# Patient Record
Sex: Male | Born: 1945 | Race: White | Hispanic: No | Marital: Married | Smoking: Former smoker
Health system: Southern US, Community
[De-identification: ages and names within clinical notes are randomized; demographics above are authoritative.]

## PROBLEM LIST (undated history)

## (undated) DIAGNOSIS — I878 Other specified disorders of veins: Secondary | ICD-10-CM

## (undated) DIAGNOSIS — I252 Old myocardial infarction: Secondary | ICD-10-CM

## (undated) DIAGNOSIS — I255 Ischemic cardiomyopathy: Secondary | ICD-10-CM

## (undated) DIAGNOSIS — E785 Hyperlipidemia, unspecified: Secondary | ICD-10-CM

## (undated) DIAGNOSIS — C4491 Basal cell carcinoma of skin, unspecified: Secondary | ICD-10-CM

## (undated) DIAGNOSIS — I2510 Atherosclerotic heart disease of native coronary artery without angina pectoris: Secondary | ICD-10-CM

## (undated) DIAGNOSIS — I739 Peripheral vascular disease, unspecified: Secondary | ICD-10-CM

## (undated) DIAGNOSIS — I471 Supraventricular tachycardia, unspecified: Secondary | ICD-10-CM

## (undated) DIAGNOSIS — G629 Polyneuropathy, unspecified: Secondary | ICD-10-CM

## (undated) DIAGNOSIS — I214 Non-ST elevation (NSTEMI) myocardial infarction: Secondary | ICD-10-CM

## (undated) DIAGNOSIS — G4730 Sleep apnea, unspecified: Secondary | ICD-10-CM

## (undated) DIAGNOSIS — I6529 Occlusion and stenosis of unspecified carotid artery: Secondary | ICD-10-CM

## (undated) DIAGNOSIS — A419 Sepsis, unspecified organism: Secondary | ICD-10-CM

## (undated) DIAGNOSIS — M869 Osteomyelitis, unspecified: Secondary | ICD-10-CM

## (undated) DIAGNOSIS — Z8673 Personal history of transient ischemic attack (TIA), and cerebral infarction without residual deficits: Secondary | ICD-10-CM

## (undated) DIAGNOSIS — I10 Essential (primary) hypertension: Secondary | ICD-10-CM

## (undated) HISTORY — DX: Supraventricular tachycardia, unspecified: I47.10

## (undated) HISTORY — DX: Basal cell carcinoma of skin, unspecified: C44.91

## (undated) HISTORY — DX: Ischemic cardiomyopathy: I25.5

## (undated) HISTORY — DX: Hyperlipidemia, unspecified: E78.5

## (undated) HISTORY — DX: Occlusion and stenosis of unspecified carotid artery: I65.29

## (undated) HISTORY — DX: Atherosclerotic heart disease of native coronary artery without angina pectoris: I25.10

## (undated) HISTORY — DX: Non-ST elevation (NSTEMI) myocardial infarction: I21.4

## (undated) HISTORY — DX: Supraventricular tachycardia: I47.1

## (undated) HISTORY — DX: Personal history of transient ischemic attack (TIA), and cerebral infarction without residual deficits: Z86.73

## (undated) HISTORY — DX: Polyneuropathy, unspecified: G62.9

## (undated) HISTORY — DX: Old myocardial infarction: I25.2

## (undated) HISTORY — PX: PILONIDAL CYST EXCISION: SHX744

## (undated) HISTORY — DX: Sleep apnea, unspecified: G47.30

## (undated) HISTORY — DX: Osteomyelitis, unspecified: M86.9

## (undated) HISTORY — DX: Peripheral vascular disease, unspecified: I73.9

## (undated) HISTORY — DX: Other specified disorders of veins: I87.8

## (undated) HISTORY — DX: Essential (primary) hypertension: I10

## (undated) HISTORY — DX: Morbid (severe) obesity due to excess calories: E66.01

---

## 1985-04-19 DIAGNOSIS — I252 Old myocardial infarction: Secondary | ICD-10-CM

## 1985-04-19 HISTORY — DX: Old myocardial infarction: I25.2

## 1991-04-20 HISTORY — PX: CORONARY ARTERY BYPASS GRAFT: SHX141

## 1998-04-19 HISTORY — PX: OTHER SURGICAL HISTORY: SHX169

## 2000-01-16 ENCOUNTER — Encounter: Payer: Self-pay | Admitting: Emergency Medicine

## 2000-01-16 ENCOUNTER — Emergency Department (HOSPITAL_COMMUNITY): Admission: EM | Admit: 2000-01-16 | Discharge: 2000-01-16 | Payer: Self-pay | Admitting: Emergency Medicine

## 2002-06-05 ENCOUNTER — Encounter: Payer: Self-pay | Admitting: Emergency Medicine

## 2002-06-05 ENCOUNTER — Emergency Department (HOSPITAL_COMMUNITY): Admission: EM | Admit: 2002-06-05 | Discharge: 2002-06-05 | Payer: Self-pay | Admitting: Emergency Medicine

## 2002-06-12 ENCOUNTER — Emergency Department (HOSPITAL_COMMUNITY): Admission: EM | Admit: 2002-06-12 | Discharge: 2002-06-12 | Payer: Self-pay | Admitting: Emergency Medicine

## 2007-10-31 ENCOUNTER — Encounter: Payer: Self-pay | Admitting: Internal Medicine

## 2007-10-31 ENCOUNTER — Ambulatory Visit (HOSPITAL_BASED_OUTPATIENT_CLINIC_OR_DEPARTMENT_OTHER): Admission: RE | Admit: 2007-10-31 | Discharge: 2007-10-31 | Payer: Self-pay | Admitting: Cardiology

## 2007-11-04 ENCOUNTER — Ambulatory Visit: Payer: Self-pay | Admitting: Internal Medicine

## 2007-12-11 ENCOUNTER — Ambulatory Visit: Payer: Self-pay | Admitting: Internal Medicine

## 2007-12-11 DIAGNOSIS — Z8679 Personal history of other diseases of the circulatory system: Secondary | ICD-10-CM | POA: Insufficient documentation

## 2007-12-11 DIAGNOSIS — E785 Hyperlipidemia, unspecified: Secondary | ICD-10-CM | POA: Insufficient documentation

## 2007-12-11 DIAGNOSIS — I10 Essential (primary) hypertension: Secondary | ICD-10-CM | POA: Insufficient documentation

## 2007-12-11 DIAGNOSIS — G4733 Obstructive sleep apnea (adult) (pediatric): Secondary | ICD-10-CM

## 2007-12-17 DIAGNOSIS — I2510 Atherosclerotic heart disease of native coronary artery without angina pectoris: Secondary | ICD-10-CM | POA: Insufficient documentation

## 2007-12-19 ENCOUNTER — Encounter: Payer: Self-pay | Admitting: Internal Medicine

## 2007-12-19 ENCOUNTER — Telehealth (INDEPENDENT_AMBULATORY_CARE_PROVIDER_SITE_OTHER): Payer: Self-pay | Admitting: *Deleted

## 2008-01-11 ENCOUNTER — Ambulatory Visit: Payer: Self-pay | Admitting: Internal Medicine

## 2008-05-10 ENCOUNTER — Ambulatory Visit: Payer: Self-pay | Admitting: Internal Medicine

## 2008-09-26 ENCOUNTER — Encounter (HOSPITAL_BASED_OUTPATIENT_CLINIC_OR_DEPARTMENT_OTHER): Admission: RE | Admit: 2008-09-26 | Discharge: 2008-10-16 | Payer: Self-pay | Admitting: Internal Medicine

## 2008-10-16 ENCOUNTER — Encounter (HOSPITAL_BASED_OUTPATIENT_CLINIC_OR_DEPARTMENT_OTHER): Admission: RE | Admit: 2008-10-16 | Discharge: 2008-11-19 | Payer: Self-pay | Admitting: Internal Medicine

## 2009-05-09 ENCOUNTER — Ambulatory Visit: Payer: Self-pay | Admitting: Internal Medicine

## 2010-01-07 ENCOUNTER — Ambulatory Visit: Payer: Self-pay | Admitting: Cardiology

## 2010-02-07 ENCOUNTER — Encounter: Payer: Self-pay | Admitting: Internal Medicine

## 2010-03-10 ENCOUNTER — Ambulatory Visit: Payer: Self-pay | Admitting: Cardiology

## 2010-04-03 ENCOUNTER — Encounter: Payer: Self-pay | Admitting: Internal Medicine

## 2010-04-29 ENCOUNTER — Ambulatory Visit: Payer: Self-pay | Admitting: Cardiology

## 2010-05-08 ENCOUNTER — Ambulatory Visit
Admission: RE | Admit: 2010-05-08 | Discharge: 2010-05-08 | Payer: Self-pay | Source: Home / Self Care | Attending: Internal Medicine | Admitting: Internal Medicine

## 2010-05-13 ENCOUNTER — Encounter: Payer: Self-pay | Admitting: Internal Medicine

## 2010-05-19 NOTE — Letter (Signed)
Summary: CMN for CPAP Supplies/[facility]  CMN for CPAP Supplies/[facility]   Imported By: Sherian Rein 02/16/2010 13:47:05  _____________________________________________________________________  External Attachment:    Type:   Image     Comment:   External Document

## 2010-05-19 NOTE — Assessment & Plan Note (Signed)
Summary: 12 months/apc   Copy to:  Dr.Tennant Primary Provider/Referring Provider:  [name]  CC:  yearly follow up visit.  History of Present Illness: 01/11/08- From 12/11/07      65 year old man seen in sleep medicine consultation at the kind request of Dr. Delfin Waters. he complains of difficulty falling asleep and and excessive waking after sleep onset.  Bedtime between 10 and 11 p.m..  Estimates sleep latency at one hour.  Wakes two to 4 times during the night before finally up at 6 a.m.  No sleep medications.  His wife has told him that he snores loudly and stops breathing.  This has gone on for years.  He denies daytime sleepiness.  He denies active limb movement during sleep.  Usually drinks 6 to 8 cups of coffee a day, in the morning, because he likes it. No hx of ENT surgery. Questions remote nasal fx, never rx'd. No hx CNS or thyroid.  NPSG 10/31/07 AHI 98.4, desat to 70%. CPAP titrated to 19 cwp for AHI 0/hr.  01/11/08- Wearing CPAP from [facility] all night, every night. Strap irritates back of neck, but cpap pressure seems ok. Better rested in AM but still tired by afternoon.  05/10/08- OSA  Persistent small tender bump on neck under cpap strap. Pressure seems good.  Happy with cpap still at 19. May not wake during night. No longer wakes smothered and denies daytime sleepiness.  May 09, 2009- OSA  He denies problems and confirms that life is much better with CPAP than without. He uses it every night and is no longer tired in the day.  Not told that he snores through it. Much less nocturia. Pressure is still at 19. Denies nasal congestion or other interfering problems.   Current Medications (verified): 1)  Potassium Chloride 20 Meq Pack (Potassium Chloride) .... Take One Tablet Daily 2)  Lipitor 40 Mg Tabs (Atorvastatin Calcium) .... Take One Tablet At Bedtime 3)  Zetia 10 Mg Tabs (Ezetimibe) .... Take One Tablet Daily. 4)  Byetta 10 Mcg Pen 10 Mcg/0.40ml Soln  (Exenatide) .... Use Two Times A Day 5)  Metformin Hcl 1000 Mg Tabs (Metformin Hcl) .... Take One Tablet By Mouth Two Times A Day 6)  Bayer Aspirin 325 Mg Tabs (Aspirin) .... Take One Tablet By Mouth Daily. 7)  Glimepiride 4 Mg Tabs (Glimepiride) .... Take One and One-Half Daily. 8)  Hydrochlorothiazide 25 Mg Tabs (Hydrochlorothiazide) .... Take One Tablet Daily. 9)  Multivitamins  Tabs (Multiple Vitamin) .... Take One Tablet By Mouth Daily. 10)  Cpap 19 [facility] 11)  Lisinopril 40 Mg Tabs (Lisinopril) .... Once Daily 12)  Co Q-10 300 Mg Caps (Coenzyme Q10) .... Once Daily 13)  Glucosamine Chondr 1500 Complx  Caps (Glucosamine-Chondroit-Vit C-Mn) .... Once Daily 14)  Diltiazem Hcl Er Beads 180 Mg Xr24h-Cap (Diltiazem Hcl Er Beads) .... Take 1 By Mouth Once Daily  Allergies (verified): No Known Drug Allergies  Past History:  Past Medical History: Last updated: 01/11/2008 ANGINA, HX OF (ICD-V12.50) HYPERTENSION (ICD-401.9) HYPERLIPIDEMIA (ICD-272.4) Coronary Heart Disease Myocardial Infarction Sleep Apnea- NPSG 10/31/07 AHI 98.4, desat to 70%. CPAP titrated to 19 cwp for AHI 0/hr.  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   Risk Factors: Smoking Status: quit (12/11/2007)  Review of Systems      See HPI  The patient denies anorexia, fever, weight loss, weight gain, vision loss, decreased hearing, hoarseness, chest pain, syncope, dyspnea on exertion, peripheral edema, prolonged cough, headaches, hemoptysis, and severe indigestion/heartburn.    Vital Signs:  Patient profile:   65 year old  male Height:      72 inches Weight:      337 pounds BMI:     45.87 O2 Sat:      96 % on Room air Pulse rate:   63 / minute BP sitting:   132 / 84  (left arm) Cuff size:   large  Vitals Entered By: Reynaldo Minium CMA (May 09, 2009 3:52 PM)  O2 Flow:  Room air  Physical Exam  Additional Exam:  General: A/Ox3; pleasant and cooperative, NAD,  morbidly obese, casual and appears comfortable SKIN: no rash,  NODES: no lymphadenopathy HEENT: [place]/AT, EOM- WNL, Conjuctivae- clear, PERRLA, TM-WNL, Nose- clear, Throat- clear and wnl, [place] IV NECK: Supple w/ fair ROM, JVD- none, normal carotid impulses w/o bruits Thyroid- normal to palpation CHEST: Clear to P&A HEART: RRR, no m/g/r heard ABDOMEN: Soft and nl; QIO:NGEX, nl pulses, no edema  NEURO: Grossly intact to observation      Impression & Recommendations:  Problem # 1:  SLEEP APNEA (ICD-780.57)  Great compliance and control with CPAP. No indication to make changes, except that weight loss is again recommended.  Medications Added to Medication List This Visit: 1)  Diltiazem Hcl Er Beads 180 Mg Xr24h-cap (Diltiazem hcl er beads) .... Take 1 by mouth once daily  Other Orders: Est. Patient Level II (52841)  Patient Instructions: 1)  Schedule return in one year, earlier if needed 2)  Continue CPAP at 19. Call if there are problems or changes. 3)  Please keep trying to get your weight down.

## 2010-05-21 NOTE — Assessment & Plan Note (Signed)
Summary: 12 months/apc   Copy to:  Dr.Tennant Primary Provider/Referring Provider:  [name]  CC:  Yearly follow up visit-sleep apnea; using CPAP each night and no complaints..  History of Present Illness: NPSG 10/31/07 AHI 98.4, desat to 70%. CPAP titrated to 19 cwp for AHI 0/hr.  01/11/08- Wearing CPAP from [facility] all night, every night. Strap irritates back of neck, but cpap pressure seems ok. Better rested in AM but still tired by afternoon.  05/10/08- OSA  Persistent small tender bump on neck under cpap strap. Pressure seems good.  Happy with cpap still at 19. May not wake during night. No longer wakes smothered and denies daytime sleepiness.  May 09, 2009- OSA  He denies problems and confirms that life is much better with CPAP than without. He uses it every night and is no longer tired in the day.  Not told that he snores through it. Much less nocturia. Pressure is still at 19. Denies nasal congestion or other interfering problems.  May 08, 2010- OSA Nurse-CC: Yearly follow up visit-sleep apnea; using CPAP each night and no complaints. One year f/u.[name] He says he loves his machine and won't sleep without it. It opens his head when he has a cold. CPAP 19, full face mask with good DME support.  Had bad cold 2 months ago with slow recovery. Dr Timothy Lasso got CXR "ok" and sample Advair.    Preventive Screening-Counseling & Management  Alcohol-Tobacco     Smoking Status: quit     Year Quit: 1997     Pack years: 20 years x 3 ppd  Current Medications (verified): 1)  Potassium Chloride 20 Meq Pack (Potassium Chloride) .... Take One Tablet Daily 2)  Lipitor 40 Mg Tabs (Atorvastatin Calcium) .... Take One Tablet At Bedtime 3)  Zetia 10 Mg Tabs (Ezetimibe) .... Take One Tablet Daily. 4)  Byetta 10 Mcg Pen 10 Mcg/0.89ml Soln (Exenatide) .... Use Two Times A Day 5)  Metformin Hcl 1000 Mg Tabs (Metformin Hcl) .... Take One Tablet By Mouth Two Times A Day 6)  Bayer Aspirin  325 Mg Tabs (Aspirin) .... Take 2 Tablet By Mouth Daily. 7)  Glimepiride 4 Mg Tabs (Glimepiride) .... Take 2 By Mouth Once Daily 8)  Hydrochlorothiazide 25 Mg Tabs (Hydrochlorothiazide) .... Take One Tablet Daily. 9)  Multivitamins  Tabs (Multiple Vitamin) .... Take One Tablet By Mouth Daily. 10)  Cpap 19 [facility] 11)  Lisinopril 40 Mg Tabs (Lisinopril) .... Once Daily 12)  Co Q-10 300 Mg Caps (Coenzyme Q10) .... Once Daily 13)  Glucosamine Chondr 1500 Complx  Caps (Glucosamine-Chondroit-Vit C-Mn) .... Take 2 By Mouth Once Daily 14)  Diltiazem Hcl Er Beads 180 Mg Xr24h-Cap (Diltiazem Hcl Er Beads) .... Take 1 By Mouth Once Daily 15)  Metoprolol Tartrate 50 Mg Tabs (Metoprolol Tartrate) .... Take 1 By Mouth Once Daily  Allergies (verified): No Known Drug Allergies  Past History:  Past Medical History: Last updated: 01/11/2008 ANGINA, HX OF (ICD-V12.50) HYPERTENSION (ICD-401.9) HYPERLIPIDEMIA (ICD-272.4) Coronary Heart Disease Myocardial Infarction Sleep Apnea- NPSG 10/31/07 AHI 98.4, desat to 70%. CPAP titrated to 19 cwp for AHI 0/hr.  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   Risk Factors: Smoking Status: quit (05/08/2010)  Review of Systems      See HPI  The patient denies shortness of breath with activity, shortness of breath at rest, productive cough, non-productive cough, coughing up blood, chest pain, irregular heartbeats, acid heartburn, indigestion, loss of appetite, weight change, abdominal pain, difficulty swallowing, sore throat, tooth/dental problems, headaches, nasal  congestion/difficulty breathing through nose, and sneezing.    Vital Signs:  Patient profile:   65 year old male Height:      72 inches Weight:      341.50 pounds BMI:     46.48 O2 Sat:      95 % on Room air Pulse rate:   52 / minute BP sitting:   142 / 66  (left arm) Cuff size:   large  Vitals Entered By: Reynaldo Minium CMA (May 08, 2010 3:33 PM)  O2 Flow:  Room air CC: Yearly follow up visit-sleep apnea; using CPAP each night and no complaints.   Physical Exam  Additional Exam:  General: A/Ox3; pleasant and cooperative, NAD,  morbidly obese, casual and appears comfortable SKIN: no rash,  NODES: no lymphadenopathy HEENT: [place]/AT, EOM- WNL, Conjuctivae- clear, PERRLA, TM-WNL, Nose- clear, Throat- clear and wnl, Mallampati IV NECK: Supple w/ fair ROM, JVD- none, normal carotid impulses w/o bruits Thyroid- normal to palpation CHEST: Wheeze R.L HEART: RRR, no m/g/r heard ABDOMEN: Soft and nl; BMW:UXLK, nl pulses, no edema  NEURO: Grossly intact to observation      Impression & Recommendations:  Problem # 1:  SLEEP APNEA (ICD-780.57)  Good compiance and control with CPAP. Sleep hygiene discussed.   Problem # 2:  WHEEZING (ICD-786.07)  Hx of heavy smoking remotely, and chest cold with bronchitis in the Fall which didn't respond to Advair sample. It bothers him a little. We will give sample Xopenex for trial. If it persists, then suggest he follow up on it with Dr Timothy Lasso.   Medications Added to Medication List This Visit: 1)  Bayer Aspirin 325 Mg Tabs (Aspirin) .... Take 2 tablet by mouth daily. 2)  Glimepiride 4 Mg Tabs (Glimepiride) .... Take 2 by mouth once daily 3)  Glucosamine Chondr 1500 Complx Caps (Glucosamine-chondroit-vit c-mn) .... Take 2 by mouth once daily 4)  Metoprolol Tartrate 50 Mg Tabs (Metoprolol tartrate) .... Take 1 by mouth once daily  Other Orders: Est. Patient Level III (44010) Flu Vaccine 9yrs + (27253) Admin 1st Vaccine (66440)  Patient  Instructions: 1)  Please schedule a follow-up appointment in 1 year. 2)  Continue CPAP at 19. Please call as needed.  3)  Follow up with Dr Timothy Lasso as needed for the wheeze.  4)  Try sample Xopenex inhaler  2 puffs four times a day as needed  5)  cc Dr Timothy Lasso   Immunizations Administered:  Influenza Vaccine # 1:    Vaccine Type: Fluvax 3+    Site: left deltoid    Mfr: GlaxoSmithKline    Dose: 0.5 ml    Route: IM    Given by: Carver Fila    Exp. Date: 10/17/2010    Lot #: [id number]  Flu Vaccine Consent Questions:    Do you have a history of severe allergic reactions to this vaccine? no    Any prior history of allergic reactions to egg and/or gelatin? no    Do you have a sensitivity to the preservative Thimersol? no    Do you have a past history of  Guillan-Barre Syndrome? no    Do you currently have an acute febrile illness? no    Have you ever had a severe reaction to latex? no    Vaccine information given and explained to patient? yes

## 2010-06-01 ENCOUNTER — Ambulatory Visit (INDEPENDENT_AMBULATORY_CARE_PROVIDER_SITE_OTHER): Payer: Medicare Other | Admitting: *Deleted

## 2010-06-01 DIAGNOSIS — I2510 Atherosclerotic heart disease of native coronary artery without angina pectoris: Secondary | ICD-10-CM

## 2010-06-01 DIAGNOSIS — I119 Hypertensive heart disease without heart failure: Secondary | ICD-10-CM

## 2010-06-02 ENCOUNTER — Ambulatory Visit (HOSPITAL_COMMUNITY)
Admission: RE | Admit: 2010-06-02 | Discharge: 2010-06-02 | Disposition: A | Discharge status: Home / Self Care | Payer: Medicare Other | Source: Ambulatory Visit | Attending: General Surgery | Admitting: General Surgery

## 2010-06-02 ENCOUNTER — Other Ambulatory Visit (HOSPITAL_BASED_OUTPATIENT_CLINIC_OR_DEPARTMENT_OTHER): Payer: Self-pay | Admitting: General Surgery

## 2010-06-02 ENCOUNTER — Encounter (HOSPITAL_BASED_OUTPATIENT_CLINIC_OR_DEPARTMENT_OTHER): Payer: Medicare Other | Attending: General Surgery

## 2010-06-02 ENCOUNTER — Other Ambulatory Visit: Payer: Self-pay

## 2010-06-02 DIAGNOSIS — E1149 Type 2 diabetes mellitus with other diabetic neurological complication: Secondary | ICD-10-CM | POA: Insufficient documentation

## 2010-06-02 DIAGNOSIS — M869 Osteomyelitis, unspecified: Secondary | ICD-10-CM

## 2010-06-02 DIAGNOSIS — L97509 Non-pressure chronic ulcer of other part of unspecified foot with unspecified severity: Secondary | ICD-10-CM | POA: Insufficient documentation

## 2010-06-02 DIAGNOSIS — Z951 Presence of aortocoronary bypass graft: Secondary | ICD-10-CM | POA: Insufficient documentation

## 2010-06-02 DIAGNOSIS — E1169 Type 2 diabetes mellitus with other specified complication: Secondary | ICD-10-CM | POA: Insufficient documentation

## 2010-06-02 DIAGNOSIS — E1142 Type 2 diabetes mellitus with diabetic polyneuropathy: Secondary | ICD-10-CM | POA: Insufficient documentation

## 2010-06-02 DIAGNOSIS — R52 Pain, unspecified: Secondary | ICD-10-CM

## 2010-06-04 NOTE — Letter (Signed)
Summary: PAP Device/[facility]  PAP Device/[facility]   Imported By: Sherian Rein 05/26/2010 09:24:23  _____________________________________________________________________  External Attachment:    Type:   Image     Comment:   External Document

## 2010-06-08 ENCOUNTER — Encounter (INDEPENDENT_AMBULATORY_CARE_PROVIDER_SITE_OTHER): Payer: Medicare Other

## 2010-06-08 DIAGNOSIS — L97909 Non-pressure chronic ulcer of unspecified part of unspecified lower leg with unspecified severity: Secondary | ICD-10-CM

## 2010-06-09 ENCOUNTER — Other Ambulatory Visit: Payer: Self-pay

## 2010-06-09 ENCOUNTER — Encounter (HOSPITAL_BASED_OUTPATIENT_CLINIC_OR_DEPARTMENT_OTHER): Payer: Medicare Other

## 2010-06-09 LAB — GLUCOSE, CAPILLARY: Glucose-Capillary: 331 mg/dL — ABNORMAL HIGH (ref 70–99)

## 2010-06-23 ENCOUNTER — Encounter (HOSPITAL_BASED_OUTPATIENT_CLINIC_OR_DEPARTMENT_OTHER): Payer: Medicare Other | Attending: General Surgery

## 2010-06-23 DIAGNOSIS — E1149 Type 2 diabetes mellitus with other diabetic neurological complication: Secondary | ICD-10-CM | POA: Insufficient documentation

## 2010-06-23 DIAGNOSIS — L97509 Non-pressure chronic ulcer of other part of unspecified foot with unspecified severity: Secondary | ICD-10-CM | POA: Insufficient documentation

## 2010-06-23 DIAGNOSIS — E1142 Type 2 diabetes mellitus with diabetic polyneuropathy: Secondary | ICD-10-CM | POA: Insufficient documentation

## 2010-06-30 ENCOUNTER — Ambulatory Visit (INDEPENDENT_AMBULATORY_CARE_PROVIDER_SITE_OTHER): Payer: Medicare Other | Admitting: Nurse Practitioner

## 2010-06-30 DIAGNOSIS — E119 Type 2 diabetes mellitus without complications: Secondary | ICD-10-CM

## 2010-06-30 DIAGNOSIS — I2510 Atherosclerotic heart disease of native coronary artery without angina pectoris: Secondary | ICD-10-CM

## 2010-06-30 DIAGNOSIS — I10 Essential (primary) hypertension: Secondary | ICD-10-CM

## 2010-07-21 ENCOUNTER — Encounter (HOSPITAL_BASED_OUTPATIENT_CLINIC_OR_DEPARTMENT_OTHER): Payer: Medicare Other | Attending: Plastic Surgery

## 2010-07-21 DIAGNOSIS — L97509 Non-pressure chronic ulcer of other part of unspecified foot with unspecified severity: Secondary | ICD-10-CM | POA: Insufficient documentation

## 2010-07-21 DIAGNOSIS — E1169 Type 2 diabetes mellitus with other specified complication: Secondary | ICD-10-CM | POA: Insufficient documentation

## 2010-07-29 ENCOUNTER — Ambulatory Visit (INDEPENDENT_AMBULATORY_CARE_PROVIDER_SITE_OTHER): Payer: Medicare Other | Admitting: Cardiology

## 2010-07-29 ENCOUNTER — Encounter: Payer: Self-pay | Admitting: Cardiology

## 2010-07-29 DIAGNOSIS — I471 Supraventricular tachycardia, unspecified: Secondary | ICD-10-CM

## 2010-07-29 DIAGNOSIS — I259 Chronic ischemic heart disease, unspecified: Secondary | ICD-10-CM

## 2010-07-29 DIAGNOSIS — R6889 Other general symptoms and signs: Secondary | ICD-10-CM | POA: Insufficient documentation

## 2010-07-29 DIAGNOSIS — I10 Essential (primary) hypertension: Secondary | ICD-10-CM

## 2010-07-29 DIAGNOSIS — I498 Other specified cardiac arrhythmias: Secondary | ICD-10-CM

## 2010-07-29 DIAGNOSIS — I219 Acute myocardial infarction, unspecified: Secondary | ICD-10-CM | POA: Insufficient documentation

## 2010-07-29 NOTE — Progress Notes (Signed)
Subjective:   Kerry Waters is seen back in the office today for followup visit. In general, he's been doing reasonably well. He's had an ulcer on his right foot has been seen in the wound clinic. His blood pressure is still elevated in spite of increasing labetalol. He has a history of supraventricular tachycardia in weight we reduced his Cardizem and I will increase that today.  He has known ischemic heart disease and had an anteroseptal myocardial infarction in 1987. He subsequent had coronary artery bypass graft in 1993. His last stress Cardiolite study was in June of 2008. He an ejection fraction of 40% at that time with mild inferior hypokinesis possible apical hypokinesis. His other ongoing problems include hypertension and lower extremity edema. He has lipid disorder and diabetes managed by Dr. Timothy Lasso. He has ongoing morbid obesity.  Current Outpatient Prescriptions  Medication Sig Dispense Refill  . aspirin 325 MG tablet Take 325 mg by mouth daily. 2 DAILY       . atorvastatin (LIPITOR) 40 MG tablet Take 40 mg by mouth daily.        . Coenzyme Q10 (COQ10 PO) Take by mouth daily.        [name] diltiazem (CARDIZEM CD) 180 MG 24 hr capsule Take 180 mg by mouth daily.        . Exenatide (BYETTA 5 MCG PEN [place]) Inject 10 mg into the skin 2 (two) times daily.       [name] glimepiride (AMARYL) 4 MG tablet Take 4 mg by mouth 2 (two) times daily.        [name] GLUCOSAMINE PO Take by mouth 2 (two) times daily.        . hydrochlorothiazide 25 MG tablet Take 25 mg by mouth daily.        [name] labetalol (NORMODYNE) 300 MG tablet Take 400 mg by mouth 2 (two) times daily.        [name] lisinopril (PRINIVIL,ZESTRIL) 40 MG tablet Take 40 mg by mouth daily.        . metFORMIN (GLUCOPHAGE) 1000 MG tablet Take 1,000 mg by mouth 2 (two) times daily with a meal.        . metoprolol (TOPROL-XL) 50 MG 24 hr tablet Take 50 mg by mouth daily.        . Multiple Vitamin (MULTIVITAMIN) tablet Take 1 tablet by mouth daily.        . potassium chloride SA  (K-DUR,KLOR-CON) 20 MEQ tablet Take 20 mEq by mouth 2 (two) times daily.          No Known Allergies  Patient Active Problem List  Diagnoses  . HYPERLIPIDEMIA  . HYPERTENSION  . MYOCARDIAL INFARCTION  . CORONARY HEART DISEASE  . SLEEP APNEA  . ANGINA, HX OF  . WHEEZING  . Joint pain  . Ulcer of foot  . Ischemic heart disease  . MI (myocardial infarction)  . Hypokinesis  . Hypertension  . Edema of lower extremity  . Obesities, morbid  . SVT (supraventricular tachycardia)    History  Smoking status  . Former Smoker  . Quit date: 04/19/1985  Smokeless tobacco  . Not on file    History  Alcohol Use No    Family History  Problem Relation Age of Onset  . Heart attack Father     Review of Systems:   The patient denies any heat or cold intolerance.  No weight gain or weight loss.  The patient denies headaches or blurry vision.  There is no  cough or sputum production.  The patient denies dizziness.  There is no hematuria or hematochezia.  The patient denies any muscle aches or arthritis.  The patient denies any rash.  The patient denies frequent falling or instability.  There is no history of depression or anxiety.  All other systems were reviewed and are negative.   Physical Exam:   Weight is 347. Blood pressure 170/80 sitting, heart rate 66. He is morbidly obese. He has a large ventral hernia.The head is normocephalic and atraumatic.  Pupils are equally round and reactive to light.  Sclerae nonicteric.  Conjunctiva is clear.  Oropharynx is unremarkable.  There's adequate oral airway.  Neck is supple there are no masses.  Thyroid is not enlarged.  There is no lymphadenopathy.  Lungs are clear.  Chest is symmetric.  Heart shows a regular rate and rhythm.  S1 and S2 are normal.  There is no murmur click or gallop.  Abdomen is soft normal bowel sounds.  There is no organomegaly.  Genital and rectal deferred.  Extremities are without edema.  Peripheral pulses are adequate.   Neurologically intact.  Full range of motion.  The patient is not depressed.  Skin is warm and dry. Assessment / Plan:

## 2010-07-29 NOTE — Assessment & Plan Note (Signed)
Encouraged him to stop drinking his 2 or 3 beers per day.I've encouraged him to reduce his sodium content.

## 2010-07-29 NOTE — Assessment & Plan Note (Signed)
Overall Kerry Waters is doing reasonably well. He had a 2-D echocardiogram with an ejection fraction of 55-60% in 2010.

## 2010-07-29 NOTE — Assessment & Plan Note (Signed)
Increase his diltiazem to 180 mg b.i.d. We'll have him see Lawson Fiscal in one month and then followup with Dr. Antoine Poche

## 2010-07-29 NOTE — Assessment & Plan Note (Signed)
We'll increase his diltiazem to 180 mg b.i.d. Large part for control of his blood pressures well. His heart rate today is 66 he is on increasing doses of beta blockers as well as labetalol.

## 2010-08-06 ENCOUNTER — Other Ambulatory Visit: Payer: Self-pay | Admitting: Plastic Surgery

## 2010-08-06 NOTE — Assessment & Plan Note (Signed)
[facility]  NAME:  [name], [name] NO.:  [id number]  MEDICAL RECORD NO.:  [id number]      DATE OF BIRTH:  Sep 24, 1945  PHYSICIAN:  Joanne Gavel, M.D.         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  At that time, he has had waxing and waning improvement but presently the wound is down to the tendon, has not improved.  We have used all reasonable and recommended measures and I believe that now he is definitely needing hyperbaric oxygen treatment to prevent further degradation.     Joanne Gavel, M.D.     RA/MEDQ  D:  08/05/2010  T:  08/06/2010  Job:  [id number]

## 2010-08-07 ENCOUNTER — Other Ambulatory Visit: Payer: Self-pay | Admitting: Plastic Surgery

## 2010-08-07 LAB — GLUCOSE, CAPILLARY
Glucose-Capillary: 156 mg/dL — ABNORMAL HIGH (ref 70–99)
Glucose-Capillary: 35 mg/dL — CL (ref 70–99)

## 2010-08-10 ENCOUNTER — Other Ambulatory Visit: Payer: Self-pay | Admitting: Plastic Surgery

## 2010-08-10 LAB — GLUCOSE, CAPILLARY
Glucose-Capillary: 201 mg/dL — ABNORMAL HIGH (ref 70–99)
Glucose-Capillary: 162 mg/dL — ABNORMAL HIGH (ref 70–99)

## 2010-08-11 ENCOUNTER — Other Ambulatory Visit: Payer: Self-pay | Admitting: Plastic Surgery

## 2010-08-11 LAB — GLUCOSE, CAPILLARY: Glucose-Capillary: 146 mg/dL — ABNORMAL HIGH (ref 70–99)

## 2010-08-12 ENCOUNTER — Other Ambulatory Visit: Payer: Self-pay | Admitting: Plastic Surgery

## 2010-08-13 ENCOUNTER — Other Ambulatory Visit: Payer: Self-pay | Admitting: Plastic Surgery

## 2010-08-13 LAB — GLUCOSE, CAPILLARY: Glucose-Capillary: 170 mg/dL — ABNORMAL HIGH (ref 70–99)

## 2010-08-14 ENCOUNTER — Other Ambulatory Visit: Payer: Self-pay | Admitting: Plastic Surgery

## 2010-08-14 LAB — GLUCOSE, CAPILLARY: Glucose-Capillary: 250 mg/dL — ABNORMAL HIGH (ref 70–99)

## 2010-08-17 ENCOUNTER — Other Ambulatory Visit: Payer: Self-pay | Admitting: Plastic Surgery

## 2010-08-18 ENCOUNTER — Encounter (HOSPITAL_BASED_OUTPATIENT_CLINIC_OR_DEPARTMENT_OTHER): Payer: Medicare Other | Attending: Plastic Surgery

## 2010-08-18 ENCOUNTER — Other Ambulatory Visit: Payer: Self-pay | Admitting: Plastic Surgery

## 2010-08-18 DIAGNOSIS — L97509 Non-pressure chronic ulcer of other part of unspecified foot with unspecified severity: Secondary | ICD-10-CM | POA: Insufficient documentation

## 2010-08-18 DIAGNOSIS — E1169 Type 2 diabetes mellitus with other specified complication: Secondary | ICD-10-CM | POA: Insufficient documentation

## 2010-08-18 LAB — GLUCOSE, CAPILLARY
Glucose-Capillary: 181 mg/dL — ABNORMAL HIGH (ref 70–99)
Glucose-Capillary: 127 mg/dL — ABNORMAL HIGH (ref 70–99)

## 2010-08-19 ENCOUNTER — Other Ambulatory Visit: Payer: Self-pay | Admitting: Plastic Surgery

## 2010-08-19 LAB — GLUCOSE, CAPILLARY: Glucose-Capillary: 214 mg/dL — ABNORMAL HIGH (ref 70–99)

## 2010-08-20 ENCOUNTER — Other Ambulatory Visit: Payer: Self-pay | Admitting: Plastic Surgery

## 2010-08-20 LAB — GLUCOSE, CAPILLARY: Glucose-Capillary: 216 mg/dL — ABNORMAL HIGH (ref 70–99)

## 2010-08-21 ENCOUNTER — Other Ambulatory Visit: Payer: Self-pay | Admitting: Plastic Surgery

## 2010-08-21 LAB — GLUCOSE, CAPILLARY: Glucose-Capillary: 195 mg/dL — ABNORMAL HIGH (ref 70–99)

## 2010-08-24 ENCOUNTER — Other Ambulatory Visit: Payer: Self-pay | Admitting: Plastic Surgery

## 2010-08-24 LAB — GLUCOSE, CAPILLARY: Glucose-Capillary: 183 mg/dL — ABNORMAL HIGH (ref 70–99)

## 2010-08-25 ENCOUNTER — Other Ambulatory Visit: Payer: Self-pay | Admitting: Plastic Surgery

## 2010-08-25 LAB — GLUCOSE, CAPILLARY
Glucose-Capillary: 285 mg/dL — ABNORMAL HIGH (ref 70–99)
Glucose-Capillary: 200 mg/dL — ABNORMAL HIGH (ref 70–99)

## 2010-08-26 ENCOUNTER — Other Ambulatory Visit: Payer: Self-pay | Admitting: Plastic Surgery

## 2010-08-26 LAB — GLUCOSE, CAPILLARY: Glucose-Capillary: 176 mg/dL — ABNORMAL HIGH (ref 70–99)

## 2010-08-27 ENCOUNTER — Other Ambulatory Visit: Payer: Self-pay | Admitting: Plastic Surgery

## 2010-08-27 LAB — GLUCOSE, CAPILLARY: Glucose-Capillary: 232 mg/dL — ABNORMAL HIGH (ref 70–99)

## 2010-08-28 ENCOUNTER — Other Ambulatory Visit: Payer: Self-pay | Admitting: Plastic Surgery

## 2010-08-31 ENCOUNTER — Other Ambulatory Visit: Payer: Self-pay | Admitting: Plastic Surgery

## 2010-08-31 ENCOUNTER — Ambulatory Visit: Payer: Medicare Other | Admitting: Nurse Practitioner

## 2010-08-31 ENCOUNTER — Ambulatory Visit (INDEPENDENT_AMBULATORY_CARE_PROVIDER_SITE_OTHER): Payer: Medicare Other | Admitting: Nurse Practitioner

## 2010-08-31 ENCOUNTER — Encounter: Payer: Self-pay | Admitting: Nurse Practitioner

## 2010-08-31 VITALS — BP 180/80 | HR 60 | Ht 71.0 in | Wt 342.5 lb

## 2010-08-31 DIAGNOSIS — I498 Other specified cardiac arrhythmias: Secondary | ICD-10-CM

## 2010-08-31 DIAGNOSIS — I471 Supraventricular tachycardia: Secondary | ICD-10-CM

## 2010-08-31 DIAGNOSIS — I2510 Atherosclerotic heart disease of native coronary artery without angina pectoris: Secondary | ICD-10-CM

## 2010-08-31 DIAGNOSIS — I10 Essential (primary) hypertension: Secondary | ICD-10-CM

## 2010-08-31 LAB — BASIC METABOLIC PANEL
BUN: 13 mg/dL (ref 6–23)
CO2: 28 mEq/L (ref 19–32)
Calcium: 9.1 mg/dL (ref 8.4–10.5)
Chloride: 98 mEq/L (ref 96–112)
Creatinine, Ser: 0.9 mg/dL (ref 0.4–1.5)
GFR: 87.59 mL/min (ref >60.00)
Glucose, Bld: 277 mg/dL — ABNORMAL HIGH (ref 70–99)
Potassium: 4.4 mEq/L (ref 3.5–5.1)
Sodium: 134 mEq/L — ABNORMAL LOW (ref 135–145)

## 2010-08-31 LAB — GLUCOSE, CAPILLARY: Glucose-Capillary: 229 mg/dL — ABNORMAL HIGH (ref 70–99)

## 2010-08-31 MED ORDER — OLMESARTAN MEDOXOMIL 20 MG PO TABS: 20.0000 mg | ORAL_TABLET | Freq: Every day | ORAL | Status: DC

## 2010-08-31 NOTE — Patient Instructions (Signed)
We are going to add Benicar to your medicines. Take one daily. We will check your kidney function today and on your return visit Monitor your blood pressure Watch your salt Try to lose some weight I will have you see Dr. Antoine Poche in 1 month

## 2010-08-31 NOTE — Progress Notes (Signed)
Kerry Waters Date of Birth: 1945/11/01   History of Present Illness: Kerry Waters comes in today for a one month visit. He is seen for Dr. Deborah Chalk. His blood pressure is still no better with increasing the Diltiazem to a BID dosing schedule. He is followed almost daily in the Wound Clinic. Blood pressure there is up as well. His foot ulcer is not healing. He says they checked the blood flow to his legs and it was "ok". No chest pain. He is taking his medicines. No complaints of arrhythmia are reported.    Current Outpatient Prescriptions on File Prior to Visit  Medication Sig Dispense Refill  . aspirin 325 MG tablet Take 325 mg by mouth daily. 2 DAILY       . atorvastatin (LIPITOR) 40 MG tablet Take 40 mg by mouth daily.        . Coenzyme Q10 (COQ10 PO) Take by mouth daily.        [name] diltiazem (CARDIZEM CD) 180 MG 24 hr capsule Take 180 mg by mouth 2 (two) times daily.       . Exenatide (BYETTA 5 MCG PEN [place]) Inject 10 mg into the skin 2 (two) times daily.       [name] glimepiride (AMARYL) 4 MG tablet Take 4 mg by mouth 2 (two) times daily.        [name] GLUCOSAMINE PO Take by mouth 2 (two) times daily.        . hydrochlorothiazide 25 MG tablet Take 25 mg by mouth daily.        [name] labetalol (NORMODYNE) 300 MG tablet Take 400 mg by mouth 2 (two) times daily.        [name] lisinopril (PRINIVIL,ZESTRIL) 40 MG tablet Take 40 mg by mouth daily.        . metFORMIN (GLUCOPHAGE) 1000 MG tablet Take 1,000 mg by mouth 2 (two) times daily with a meal.        . metoprolol (TOPROL-XL) 50 MG 24 hr tablet Take 50 mg by mouth daily.        . Multiple Vitamin (MULTIVITAMIN) tablet Take 1 tablet by mouth daily.        . potassium chloride SA (K-DUR,KLOR-CON) 20 MEQ tablet Take 20 mEq by mouth 2 (two) times daily.        [name] olmesartan (BENICAR) 20 MG tablet Take 1 tablet (20 mg total) by mouth daily.  30 tablet  11    No Known Allergies  Past Medical History  Diagnosis Date  . Joint pain   . Ulcer of foot   . Ischemic  heart disease   . MI (myocardial infarction) 1987    ANTERIOR SEPTAL  . Hypokinesis     MILD INFERIOR  . Hypertension   . Edema of lower extremity   . Obesities, morbid   . SVT (supraventricular tachycardia)   . Hx of CABG 1993  . Abnormal cardiovascular stress test 2008    EF 48%. No clear cut ischemia. Low risk scan  . Diabetes mellitus     Dr. Timothy Lasso follows.  [name] PVD (peripheral vascular disease)   . Venous stasis   . Peripheral neuropathy     Past Surgical History  Procedure Date  . Coronary artery bypass graft 1993    LIMA to LAD with patch angioplasty, SVG to OM, SVG to OM & PD and patch angioplasy to PDA  . Left ankle orif 2000    History  Smoking status  . Former Smoker --  1.0 packs/day for 30 years  . Types: Cigarettes  . Quit date: 04/19/1985  Smokeless tobacco  . Never Used    History  Alcohol Use No    Family History  Problem Relation Age of Onset  . Heart attack Father     Review of Systems: The review of systems is positive for chronic edema, foot ulcer and joint pain. Sugars remain labile. He has not lost weight. Difficult to say what his salt use is. No chest pain. No recurrence of SVT with being on Diltiazem.  All other systems were reviewed and are negative.  Physical Exam: BP 180/80  Pulse 60  Ht 5\' 11"  (1.803 m)  Wt 342 lb 8 oz (155.357 kg)  BMI 47.77 kg/m2 Patient is pleasant and in no acute distress. He is morbidly obese. Skin is warm and dry. Color is normal.  HEENT is unremarkable. Normocephalic/atraumatic. PERRL. Sclera are nonicteric. Neck is supple. No masses. No JVD. Lungs are fairly clear. Cardiac exam shows a regular rate and rhythm. Heart tones are distant. Abdomen is obese and soft. Boot is in place on the right foot. Gait and ROM are intact. No gross neurologic deficits noted.  LABORATORY DATA: BMET is pending   Assessment / Plan:

## 2010-08-31 NOTE — Assessment & Plan Note (Signed)
Blood pressure is still not controlled. Actually he has had no improvement with all the medicine changes for the last few months. I think we need to rule out RAS. He is wanting to hold off. I have added Benicar 20mg  to his regimen. We will check renal function today. I will have him see Dr. Antoine Poche in one month for a follow up visit and a repeat BMET. I think he will need an MRA instead of a renal duplex because of his size. Once again, he is encouraged to watch his salt and try to work on his weight. Patient is agreeable to this plan and will call if any problems develop in the interim.

## 2010-08-31 NOTE — Assessment & Plan Note (Signed)
No recurrence of arrhythmia with switching from Procardia to Diltiazem. We will continue with our current regimen.

## 2010-08-31 NOTE — Assessment & Plan Note (Signed)
Currently asymptomatic. We will keep him on his current regimen.

## 2010-09-01 ENCOUNTER — Other Ambulatory Visit: Payer: Self-pay | Admitting: Plastic Surgery

## 2010-09-01 LAB — GLUCOSE, CAPILLARY
Glucose-Capillary: 181 mg/dL — ABNORMAL HIGH (ref 70–99)
Glucose-Capillary: 147 mg/dL — ABNORMAL HIGH (ref 70–99)

## 2010-09-01 NOTE — Consult Note (Signed)
NAMEIZAK, ANDING NO.:  [id number]   MEDICAL RECORD NO.:  [id number]          PATIENT TYPE:  REC   LOCATION:  FOOT                         FACILITY:  [facility]   PHYSICIAN:  Lenon Curt. Chilton Si, M.D.  DATE OF BIRTH:  [birth year]   DATE OF CONSULTATION:  09/27/2008  DATE OF DISCHARGE:                                 CONSULTATION   HISTORY:  A 65 year old male who has been [facility] by Dr. Roger Shelter to evaluate a left leg lateral  malleolar ulcer which had been present for about 6 months.  His primary  care doctor is Dr. Timothy Lasso of [facility].  The patient  states the ulcer is no better by using Neosporin ointment and iodine.  He has made no progress in the last 6 months and he is seeking  assistance in getting it healed.   The situation is difficult because of severe varicose veins and chronic  edema.   Other compromising factors include his diabetes mellitus, tobacco use,  and peripheral neuropathy.   PAST MEDICAL HISTORY:  1. Prior diagnoses of myocardial infarction.  2. Peripheral vascular disease.  3. Chronic venous stasis.  4. Open reduction and internal fixation of left ankle in 2000 with      metal hardware still in place done by Dr. Mckinley Jewel.  5. Non-insulin-dependent diabetes mellitus.  6. Hyperlipidemia.  7. Tobacco use.  8. Peripheral neuropathy.   PAST SURGICAL HISTORY:  1. In 1993, open heart surgery.  2. In 2000, fractured left ankle with open reduction and internal      fixation.  3. In 2005, excision of basal cell cancer of the right shoulder.   ALLERGIES:  None known.   MEDICATIONS:  1. Nifedipine ER 60 mg one daily.  2. Lipitor 40 mg one daily.  3. Potassium chloride 20 mEq twice daily.  4. Metformin 1000 mg twice daily.  5. Glimepiride 4 mg 1-1/2 tablets daily.  6. Hydrochlorothiazide 25 mg daily.  7. Multivitamins one daily.  8. Lisinopril 40 mg daily.  9. Zetia 10 mg  daily.  10.CoQ10 300 mg daily.  11.Glucosamine with MSM 1500/1500 one daily.  12.Enteric-coated aspirin 325 mg twice daily.  13.Acetaminophen as needed.   FAMILY HISTORY:  Noncontributory to the current situation.   SOCIAL HISTORY:  He is sedentary, continues to work.  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  There is  positive history for tobacco use and alcohol use.   REVIEW OF SYSTEMS:  Other than the factors noted above, he is basic  unremarkable.  He denies headache.  There has been no recent weight  gain.  Edema in the legs is better in the morning and within an hour  starts swelling more during the day.  He feels that the vein harvesting  for his open heart surgery had contributed some to the edema.  Other  than his diabetes, there are no other known endocrine problems.  He  denies significant shortness of breath, palpitations, chest pains,  reflux problems, history of ulcer,  bowel problems, dysuria, prostate  problems, diarrhea, or constipation.  He does have some mild arthritic  complaints and no joint deformities other than his ankle.  He had no  other broken bones.   PHYSICAL EXAMINATION:  VITAL SIGNS:  Temperature 98, pulse 69,  respirations 21, blood pressure 194/71, and capillary glucose 164.  GENERAL:  This is a cheerful, cooperative, older man who has morbid  obesity.  SKIN:  No void ulcer at the left lateral malleolus, 2.0 x 1.0 x 0.2 cm  in diameter.  HEAD, EYES, EARS, NOSE, AND THROAT:  Prescription lenses.  NECK:  Normal without thyromegaly, nodule, mass, or bruit.  CHEST:  Clear to auscultation.  Sternal scars are present.  HEART:  Regular rhythm without gallop, murmur, click, or rub.  ABDOMEN:  Obese and nontender.  EXTREMITIES:  Scar at the left ankle as well as ulceration of the left  ankle.  There is no joint deformity noted.  Circulation severe with  bilateral varicosities.  MUSCULOSKELETAL:  Normal.  NEUROLOGIC:  Diminished  sensation in the feet.  Cranial nerves intact.   PROBLEM:  Varicose veins with ulcer.   PLAN:  A culture was obtained of this.  The wounds were sharply debrided  and debrided with forceps of shaggy debris on the edges of the wound as  well as some callus.  Puracol Plus AG was added to the surface of the  wound.  Profore dressing was used.  He is to return in 1 week for  recheck of the wound and repeat Profore dressing changes.      Lenon Curt Chilton Si, M.D.  Electronically Signed     AGG/MEDQ  D:  09/27/2008  T:  09/28/2008  Job:  [id number]   cc:   Colleen Can. Deborah Chalk, M.D.  Gwen Pounds, MD

## 2010-09-01 NOTE — Assessment & Plan Note (Signed)
[facility]   NAME:  GEE, HABIG             ACCOUNT NO.:  [id number]   MEDICAL RECORD NO.:  [id number]      DATE OF BIRTH:  20-Apr-1945   PHYSICIAN:  Lenon Curt. Chilton Si, M.D.   VISIT DATE:  10/11/2008                                   OFFICE VISIT   HISTORY:  A 66 year old male returns for recheck of wounds of the left  lateral malleolar area and lower leg area.  Wounds have improved since I  last saw him, September 27, 2008.  He has not had any pain or discomfort.  The Profore dressings have been tolerated well.  There has been no odor  and minimal drainage.   PHYSICAL EXAMINATION:  Temp 98.3, pulse 67, respirations 16, blood  pressure 128/77.  Wound measurement of the left ankle and lower  extremity distally now 1.4 x 0.4 x less than 0.1 cm depth.  Wound is  clearly healing and doing much better.  There is no significant  drainage.  Both legs continued to be swollen.  There are old scars of  the left leg due to prior ulcerations.   TREATMENT:  Reapplied Puracol AG and Profore.  The patient to return in  1 week.  It is anticipated that this may be a discharge visit.   ICD-9 454.0, varicose veins with ulcer.  CPT [id number].      Lenon Curt Chilton Si, M.D.  Electronically Signed     AGG/MEDQ  D:  10/11/2008  T:  10/12/2008  Job:  [id number]

## 2010-09-01 NOTE — Assessment & Plan Note (Signed)
[facility]   NAME:  BOBBI, KOZAKIEWICZ             ACCOUNT NO.:  [id number]   MEDICAL RECORD NO.:  [id number]      DATE OF BIRTH:  August 02, 1945   PHYSICIAN:  Lenon Curt. Chilton Si, M.D.   VISIT DATE:  11/01/2008                                   OFFICE VISIT   HISTORY:  A 65 year old male returns for recheck of ulcer in left lower  extremity distally just above the ankle.  He has chronic venous  insufficiency, varicose veins, and chronic leg edema.  Wound has done  well since last seen.  There is only a very small area left.  There has  been no fever, chills, erythema, or increased discomfort.  He has done  well with using the compression stockings.   PHYSICAL EXAMINATION:  VITAL SIGNS:  Temperature 98.6, pulse 63,  respirations 17, and blood pressure 162/82.  Glucose 139.  EXTREMITIES:  Wound measurements of the wound of the left lower  extremity distally above the ankle is now 0.9 x 0.2 x 0.1.  Some of this  measurements is a loose edge of a scab that is adherent in the middle  portion still.  There is no leakage.  The scab is quite dry.   TREATMENT:  Bactroban bandage and compression stockings.  The patient  was released from wound care today.  We feel that the wound is  progressed well enough that he continue to care for this at home.  He is  to return if there are any problems.   ICD-9, 454.2 varicose vein with ulcer and inflammation.  443.9 peripheral vascular disease.   CPT [id number].      Lenon Curt Chilton Si, M.D.  Electronically Signed     AGG/MEDQ  D:  11/01/2008  T:  11/02/2008  Job:  [id number]   cc:   Colleen Can. Deborah Chalk, M.D.  Gwen Pounds, MD

## 2010-09-01 NOTE — Assessment & Plan Note (Signed)
[facility]   NAME:  Kerry Waters, Kerry Waters             ACCOUNT NO.:  [id number]   MEDICAL RECORD NO.:  [id number]      DATE OF BIRTH:  11-18-1945   PHYSICIAN:  Lenon Curt. Chilton Si, M.D.        VISIT DATE:                                   OFFICE VISIT   HISTORY:  A 65 year old male returns for recheck of wounds of the left  leg.  He has done well since last seen.  He tolerated his Profore Lite  very well.  He has no complaints today.   On exam, temperature 98.5, pulse 61, respirations 16, blood pressure  143/79.  Wounds of the left lower extremity distally are scabbed over  with a thin scab, they appear to be healing well at the present time.  The most distal wound is still slightly open under the scab.  He  continues to have bilateral leg edema and likely venous insufficiency  disease related to his scars on both legs due to prior ulcerations with  healing.   Wounds appear to be progressing well at the present time.  I think he  can graduate out of the Profore Lite and do just compression stockings.  We applied Bactroban and bandages to his wounds today and sent him home.  He has compression hose at home.  I did write a prescription for  compression hose with 20-25 mm compression strength.  He will return in  2 weeks for hopefully, a final recheck of these wounds.   ICD-9-454.2 varicose veins with ulcer and inflammation.  443.9 peripheral vascular disease.   CPT [id number].      Lenon Curt Chilton Si, M.D.  Electronically Signed     AGG/MEDQ  D:  10/18/2008  T:  10/19/2008  Job:  [id number]

## 2010-09-01 NOTE — Procedures (Signed)
NAME:  Kerry Waters, Kerry Waters NO.:  [id number]   MEDICAL RECORD NO.:  [id number]          PATIENT TYPE:  OUT   LOCATION:  SLEEP CENTER                 FACILITY[facility]  [facility]   PHYSICIAN:  Clinton D. Maple Hudson, MD, FCCP, FACPDATE OF BIRTH:  1945-11-[birth year]   DATE OF STUDY:  10/31/2007                            NOCTURNAL POLYSOMNOGRAM   REFERRING PHYSICIAN:  Colleen Can. Deborah Chalk, M.D.   INDICATION FOR STUDY:  Hypersomnia with sleep apnea.   EPWORTH SLEEPINESS SCORE:  5/24, BMI 47.4, weight 340 pounds, height 71  inches, neck 19 inches.   MEDICATIONS:  Charted and reviewed.   SLEEP ARCHITECTURE:  Split study protocol.  During the diagnostic phase,  total sleep time was 122.5 minutes with sleep efficiency 53.1%.  Stage 1  was 5.7%, stage 2 80.8%, stage 3 absent, REM 13.5% of total sleep time,  sleep latency 32.5 minutes, REM latency 105 minutes.  Awake after sleep  onset 74 minutes.  Arousal index 14.2.  No bedtime medication was taken.   RESPIRATORY DATA:  Split study protocol.  Apnea/hypopnea index (AHI)  98.4 per hour.  201 events were scored including 49 obstructive apneas,  1 central apnea, and 151 hypopneas.  Events were not positional.  REM  AHI 98.2.  CPAP was titrated to 19 CWP, AHI 0 per hour.  A large Mirage  Quattro mask was chosen with heated humidifier.   OXYGEN DATA:  Moderate snoring with oxygen desaturation to a nadir of  70%.  After CPAP control, mean oxygen saturation was still low at 89.6%  on room air.  A total of 12.2 minutes were spent with oxygen saturation  less than 88% while on CPAP.   CARDIAC DATA:  Sinus rhythm with frequent multifocal PVCs, some bigeminy  and couplets, occasional PACs.   MOVEMENT-PARASOMNIA:   IMPRESSIONS-RECOMMENDATIONS:  1. Severe obstructive sleep apnea/hypopnea syndrome, apnea/hypopnea      index 98.4 per hour with nonpositional events, moderate snoring,      and oxygen desaturation to a nadir of 70%.  2. CPAP titration to 19  CWP, apnea/hypopnea index 0 per hour.  He      chose a large Mirage Quattro full face mask      with heated humidifier.  3. Frequent premature ventricular contractions including multifocal,      bigeminy and couplets.      Clinton D. Maple Hudson, MD, [facility], FACP  Diplomate, Biomedical engineer of Sleep Medicine  Electronically Signed     CDY/MEDQ  D:  11/04/2007 10:37:41  T:  11/04/2007 11:36:37  Job:  [id number]

## 2010-09-01 NOTE — Assessment & Plan Note (Signed)
[facility]   NAME:  Kerry Waters, Kerry Waters NO.:  [id number]   MEDICAL RECORD NO.:  [id number]      DATE OF BIRTH:  June 16, 1945   PHYSICIAN:  Joanne Gavel, M.D.         VISIT DATE:  10/04/2008                                   OFFICE VISIT   HISTORY OF PRESENT ILLNESS:  A 65 year old male with chronic venosus  stasis and stasis ulceration of the medial aspect of the left leg.  This  was treated with debridement, Puracol and Profore light.   PHYSICAL EXAMINATION:  VITAL SIGNS:  Temperature 98.5, pulse 72,  respirations 20, blood pressure 145/81.  Today, the wound is 1.9 x 0.4 x 0.1, which represents a significant  improvement.  The base is relatively clean.   PLAN:  Continue same treatment.  Puracol, Hydrogel, and Profore light.  See in 7 days.      Joanne Gavel, M.D.  Electronically Signed     RA/MEDQ  D:  10/04/2008  T:  10/05/2008  Job:  [id number]

## 2010-09-02 ENCOUNTER — Other Ambulatory Visit: Payer: Self-pay | Admitting: Plastic Surgery

## 2010-09-02 ENCOUNTER — Telehealth: Payer: Self-pay | Admitting: *Deleted

## 2010-09-02 NOTE — Telephone Encounter (Signed)
Pt notified of lab results and the need for better glucose control.  Pt verbalized to RN understanding for the need for better glucose control.

## 2010-09-02 NOTE — Telephone Encounter (Signed)
Message copied by Barnetta Hammersmith on Wed Sep 02, 2010  7:56 AM ------      Message from: Norma Fredrickson      Created: Mon Aug 31, 2010  2:38 PM       Ok to report. Labs are satisfactory except for glucose.

## 2010-09-03 ENCOUNTER — Other Ambulatory Visit: Payer: Self-pay | Admitting: Plastic Surgery

## 2010-09-03 LAB — GLUCOSE, CAPILLARY: Glucose-Capillary: 184 mg/dL — ABNORMAL HIGH (ref 70–99)

## 2010-09-04 ENCOUNTER — Other Ambulatory Visit: Payer: Self-pay | Admitting: Plastic Surgery

## 2010-09-04 LAB — GLUCOSE, CAPILLARY: Glucose-Capillary: 271 mg/dL — ABNORMAL HIGH (ref 70–99)

## 2010-09-07 ENCOUNTER — Other Ambulatory Visit: Payer: Self-pay | Admitting: Plastic Surgery

## 2010-09-07 LAB — GLUCOSE, CAPILLARY: Glucose-Capillary: 134 mg/dL — ABNORMAL HIGH (ref 70–99)

## 2010-09-08 ENCOUNTER — Other Ambulatory Visit: Payer: Self-pay | Admitting: Plastic Surgery

## 2010-09-09 ENCOUNTER — Other Ambulatory Visit: Payer: Self-pay | Admitting: Plastic Surgery

## 2010-09-09 LAB — GLUCOSE, CAPILLARY: Glucose-Capillary: 205 mg/dL — ABNORMAL HIGH (ref 70–99)

## 2010-09-10 ENCOUNTER — Other Ambulatory Visit: Payer: Self-pay | Admitting: Plastic Surgery

## 2010-09-10 LAB — GLUCOSE, CAPILLARY
Glucose-Capillary: 216 mg/dL — ABNORMAL HIGH (ref 70–99)
Glucose-Capillary: 158 mg/dL — ABNORMAL HIGH (ref 70–99)

## 2010-09-11 ENCOUNTER — Other Ambulatory Visit: Payer: Self-pay | Admitting: Plastic Surgery

## 2010-09-11 LAB — GLUCOSE, CAPILLARY: Glucose-Capillary: 212 mg/dL — ABNORMAL HIGH (ref 70–99)

## 2010-09-15 ENCOUNTER — Other Ambulatory Visit: Payer: Self-pay | Admitting: Plastic Surgery

## 2010-09-15 LAB — GLUCOSE, CAPILLARY
Glucose-Capillary: 252 mg/dL — ABNORMAL HIGH (ref 70–99)
Glucose-Capillary: 151 mg/dL — ABNORMAL HIGH (ref 70–99)

## 2010-09-16 ENCOUNTER — Other Ambulatory Visit: Payer: Self-pay | Admitting: Plastic Surgery

## 2010-09-17 ENCOUNTER — Other Ambulatory Visit: Payer: Self-pay | Admitting: Plastic Surgery

## 2010-09-17 LAB — GLUCOSE, CAPILLARY: Glucose-Capillary: 211 mg/dL — ABNORMAL HIGH (ref 70–99)

## 2010-09-18 ENCOUNTER — Other Ambulatory Visit: Payer: Self-pay | Admitting: Plastic Surgery

## 2010-09-18 ENCOUNTER — Encounter (HOSPITAL_BASED_OUTPATIENT_CLINIC_OR_DEPARTMENT_OTHER): Payer: Medicare Other | Attending: Plastic Surgery

## 2010-09-18 DIAGNOSIS — L97509 Non-pressure chronic ulcer of other part of unspecified foot with unspecified severity: Secondary | ICD-10-CM | POA: Insufficient documentation

## 2010-09-18 DIAGNOSIS — E1169 Type 2 diabetes mellitus with other specified complication: Secondary | ICD-10-CM | POA: Insufficient documentation

## 2010-09-21 ENCOUNTER — Other Ambulatory Visit: Payer: Self-pay | Admitting: Plastic Surgery

## 2010-09-22 ENCOUNTER — Other Ambulatory Visit: Payer: Self-pay | Admitting: Plastic Surgery

## 2010-09-23 ENCOUNTER — Other Ambulatory Visit: Payer: Self-pay | Admitting: Plastic Surgery

## 2010-09-23 LAB — GLUCOSE, CAPILLARY
Glucose-Capillary: 252 mg/dL — ABNORMAL HIGH (ref 70–99)
Glucose-Capillary: 204 mg/dL — ABNORMAL HIGH (ref 70–99)

## 2010-09-24 ENCOUNTER — Other Ambulatory Visit: Payer: Self-pay | Admitting: Plastic Surgery

## 2010-09-24 LAB — GLUCOSE, CAPILLARY
Glucose-Capillary: 282 mg/dL — ABNORMAL HIGH (ref 70–99)
Glucose-Capillary: 257 mg/dL — ABNORMAL HIGH (ref 70–99)

## 2010-09-25 ENCOUNTER — Other Ambulatory Visit: Payer: Self-pay | Admitting: Plastic Surgery

## 2010-09-25 LAB — GLUCOSE, CAPILLARY
Glucose-Capillary: 219 mg/dL — ABNORMAL HIGH (ref 70–99)
Glucose-Capillary: 182 mg/dL — ABNORMAL HIGH (ref 70–99)

## 2010-09-28 ENCOUNTER — Other Ambulatory Visit: Payer: Self-pay | Admitting: Plastic Surgery

## 2010-09-28 LAB — GLUCOSE, CAPILLARY: Glucose-Capillary: 123 mg/dL — ABNORMAL HIGH (ref 70–99)

## 2010-09-29 ENCOUNTER — Other Ambulatory Visit: Payer: Self-pay | Admitting: Plastic Surgery

## 2010-09-29 LAB — GLUCOSE, CAPILLARY: Glucose-Capillary: 202 mg/dL — ABNORMAL HIGH (ref 70–99)

## 2010-09-30 ENCOUNTER — Other Ambulatory Visit: Payer: Self-pay | Admitting: Plastic Surgery

## 2010-09-30 LAB — GLUCOSE, CAPILLARY: Glucose-Capillary: 156 mg/dL — ABNORMAL HIGH (ref 70–99)

## 2010-10-01 ENCOUNTER — Other Ambulatory Visit: Payer: Self-pay | Admitting: Plastic Surgery

## 2010-10-09 ENCOUNTER — Telehealth: Payer: Self-pay | Admitting: Cardiology

## 2010-10-09 ENCOUNTER — Encounter: Payer: Self-pay | Admitting: Cardiology

## 2010-10-09 ENCOUNTER — Ambulatory Visit (INDEPENDENT_AMBULATORY_CARE_PROVIDER_SITE_OTHER): Payer: Medicare Other | Admitting: Cardiology

## 2010-10-09 VITALS — BP 172/94 | HR 59 | Resp 18 | Ht 71.0 in | Wt 322.0 lb

## 2010-10-09 DIAGNOSIS — I2510 Atherosclerotic heart disease of native coronary artery without angina pectoris: Secondary | ICD-10-CM

## 2010-10-09 DIAGNOSIS — I10 Essential (primary) hypertension: Secondary | ICD-10-CM

## 2010-10-09 DIAGNOSIS — I471 Supraventricular tachycardia: Secondary | ICD-10-CM

## 2010-10-09 DIAGNOSIS — I498 Other specified cardiac arrhythmias: Secondary | ICD-10-CM

## 2010-10-09 MED ORDER — LABETALOL HCL 200 MG PO TABS: ORAL_TABLET | ORAL | Status: DC

## 2010-10-09 NOTE — Progress Notes (Signed)
HPI The patient presents as a new patient to me.  He has a long history with Dr. Deborah Chalk.  He has had CAD.  Most recently he had SVT and was taken off of Procardia and is on Metoprolol, labetalol and cardizem.  He has had no further tachypalpitations.  However, his BP has not been well controlled.  Currently he his very limited by pain from a diabetic foot.  He is not doing very much physical activity.  The patient denies any new symptoms such as chest discomfort, neck or arm discomfort. There has been no new shortness of breath, PND or orthopnea. There have been no reported palpitations, presyncope or syncope.   No Known Allergies  Current Outpatient Prescriptions  Medication Sig Dispense Refill  . aspirin 325 MG tablet Take 325 mg by mouth daily. 2 DAILY       . atorvastatin (LIPITOR) 40 MG tablet Take 40 mg by mouth daily.        . Coenzyme Q10 (COQ10 PO) Take by mouth daily.        [name] diltiazem (CARDIZEM CD) 180 MG 24 hr capsule Take 180 mg by mouth 2 (two) times daily.       . Exenatide (BYETTA 5 MCG PEN [place]) Inject 10 mg into the skin 2 (two) times daily.       [name] ezetimibe (ZETIA) 10 MG tablet Take 10 mg by mouth daily.        [name] glimepiride (AMARYL) 4 MG tablet Take 4 mg by mouth 2 (two) times daily.        [name] GLUCOSAMINE PO Take by mouth 2 (two) times daily.        . hydrochlorothiazide 25 MG tablet Take 25 mg by mouth daily.        [name] labetalol (NORMODYNE) 300 MG tablet Take 400 mg by mouth 2 (two) times daily.        [name] lisinopril (PRINIVIL,ZESTRIL) 40 MG tablet Take 40 mg by mouth daily.        . metFORMIN (GLUCOPHAGE) 1000 MG tablet Take 1,000 mg by mouth 2 (two) times daily with a meal.        . metoprolol (TOPROL-XL) 50 MG 24 hr tablet Take 50 mg by mouth daily.        . Multiple Vitamin (MULTIVITAMIN) tablet Take 1 tablet by mouth daily.        [name] olmesartan (BENICAR) 20 MG tablet Take 1 tablet (20 mg total) by mouth daily.  30 tablet  11  . potassium chloride SA (K-DUR,KLOR-CON) 20  MEQ tablet Take 20 mEq by mouth 2 (two) times daily.        [name] DISCONTD: LORazepam (ATIVAN) 1 MG tablet Take 1 tablet by mouth as directed.        Past Medical History  Diagnosis Date  . Joint pain   . Ulcer of foot   . Ischemic heart disease   . MI (myocardial infarction) 1987    ANTERIOR SEPTAL  . Hypokinesis     MILD INFERIOR  . Hypertension   . Edema of lower extremity   . Obesities, morbid   . SVT (supraventricular tachycardia)   . Hx of CABG 1993  . Abnormal cardiovascular stress test 2008    EF 48%. No clear cut ischemia. Low risk scan  . Diabetes mellitus     Dr. Timothy Lasso follows.  [name] PVD (peripheral vascular disease)   . Venous stasis   . Peripheral neuropathy  Past Surgical History  Procedure Date  . Coronary artery bypass graft 1993    LIMA to LAD with patch angioplasty, SVG to OM, SVG to OM & PD and patch angioplasy to PDA  . Left ankle orif 2000    ROS:  As stated in the HPI and negative for all other systems.  PHYSICAL EXAM BP 172/94  Pulse 59  Resp 18  Ht 5\' 11"  (1.803 m)  Wt 322 lb (146.058 kg)  BMI 44.91 kg/m2 GENERAL:  Well appearing HEENT:  Pupils equal round and reactive, fundi not visualized, oral mucosa unremarkable NECK:  No jugular venous distention, waveform within normal limits, carotid upstroke brisk and symmetric, no bruits, no thyromegaly LYMPHATICS:  No cervical, inguinal adenopathy LUNGS:  Clear to auscultation bilaterally BACK:  No CVA tenderness CHEST:  Unremarkable HEART:  PMI not displaced or sustained,S1 and S2 within normal limits, no S3, no S4, no clicks, no rubs, no murmurs ABD:  Flat, positive bowel sounds normal in frequency in pitch, no bruits, no rebound, no guarding, no midline pulsatile mass, no hepatomegaly, no splenomegaly, morbidly obese EXT:  2 plus pulses upper, diminshed lower with diabetic shoe on the right foot, no edema, no cyanosis no clubbing SKIN:  No rashes no nodules NEURO:  Cranial nerves II through XII  grossly intact, motor grossly intact throughout PSYCH:  Cognitively intact, oriented to person place and time  EKG:  NSR, rate 59.  Axis WNL.  No acute ST T wave changes  ASSESSMENT AND PLAN

## 2010-10-09 NOTE — Patient Instructions (Signed)
Please increase your Labetalol to 400 mg three times a day Continue all other medications as listed Follow up with Dr Antoine Poche in 4 to 6 weeks.

## 2010-10-09 NOTE — Assessment & Plan Note (Signed)
CAD.  He will continue with meds as listed and risk reduction.

## 2010-10-09 NOTE — Assessment & Plan Note (Signed)
We spent a long time talking about weight control her blood pressure management. I will change his labetalol to 400 mg t.i.d. We will stop his metoprolol. He'll keep a blood pressure diary and I will titrate his meds as necessary.

## 2010-10-09 NOTE — Assessment & Plan Note (Signed)
We spent a large amount of time discussing weight loss with the [employer].

## 2010-10-09 NOTE — Assessment & Plan Note (Signed)
He has no symptomatic tachypalpitations similar to previous.  He will continue with meds as listed.

## 2010-10-09 NOTE — Telephone Encounter (Signed)
Latest everything

## 2010-10-20 ENCOUNTER — Encounter (HOSPITAL_BASED_OUTPATIENT_CLINIC_OR_DEPARTMENT_OTHER): Payer: Medicare Other | Attending: Plastic Surgery

## 2010-10-20 DIAGNOSIS — E1169 Type 2 diabetes mellitus with other specified complication: Secondary | ICD-10-CM | POA: Insufficient documentation

## 2010-10-20 DIAGNOSIS — L97509 Non-pressure chronic ulcer of other part of unspecified foot with unspecified severity: Secondary | ICD-10-CM | POA: Insufficient documentation

## 2010-11-04 ENCOUNTER — Encounter: Payer: Self-pay | Admitting: Cardiology

## 2010-11-12 ENCOUNTER — Telehealth: Payer: Self-pay | Admitting: Internal Medicine

## 2010-11-12 DIAGNOSIS — G4733 Obstructive sleep apnea (adult) (pediatric): Secondary | ICD-10-CM

## 2010-11-12 NOTE — Telephone Encounter (Signed)
Order was sent to [facility] and pt made aware.

## 2010-11-12 NOTE — Telephone Encounter (Signed)
Order- [facility]- His DME to replace CPAP machine at current pressure, with heated humidifier   Dx OSA

## 2010-11-12 NOTE — Telephone Encounter (Signed)
Pt last saw Cy on 05/08/10 and was told to f/u in 1 year.  Called and spoke with pt.  Pt states his cpap machine broke.  He took it into DME company and was told it was not repairable.  Therefore, pt requesting order to [facility] in [place] for a new cpap machine. Please advise.  Thanks.

## 2010-11-20 ENCOUNTER — Encounter (HOSPITAL_BASED_OUTPATIENT_CLINIC_OR_DEPARTMENT_OTHER): Payer: Medicare Other | Attending: General Surgery

## 2010-11-20 DIAGNOSIS — E1169 Type 2 diabetes mellitus with other specified complication: Secondary | ICD-10-CM | POA: Insufficient documentation

## 2010-11-20 DIAGNOSIS — L97509 Non-pressure chronic ulcer of other part of unspecified foot with unspecified severity: Secondary | ICD-10-CM | POA: Insufficient documentation

## 2010-12-01 ENCOUNTER — Ambulatory Visit: Payer: Medicare Other | Admitting: Cardiology

## 2010-12-14 ENCOUNTER — Telehealth: Payer: Self-pay | Admitting: Internal Medicine

## 2010-12-14 NOTE — Telephone Encounter (Signed)
Per Katie send to her 

## 2010-12-14 NOTE — Telephone Encounter (Signed)
Will call [facility] [place]- [phone] to find out if they are truly telling patient to bring card/CPAP to our office for download.

## 2010-12-16 NOTE — Telephone Encounter (Signed)
Left message for patient to call back-will need to take his "chip" from CPAP machine to obtain a download-he will also to need to schedule OV with CY. Thanks.

## 2010-12-18 NOTE — Telephone Encounter (Signed)
[email or web address]  

## 2010-12-22 ENCOUNTER — Encounter (HOSPITAL_BASED_OUTPATIENT_CLINIC_OR_DEPARTMENT_OTHER): Payer: Medicare Other | Attending: General Surgery

## 2010-12-22 DIAGNOSIS — L97509 Non-pressure chronic ulcer of other part of unspecified foot with unspecified severity: Secondary | ICD-10-CM | POA: Insufficient documentation

## 2010-12-22 DIAGNOSIS — E1169 Type 2 diabetes mellitus with other specified complication: Secondary | ICD-10-CM | POA: Insufficient documentation

## 2010-12-22 NOTE — Telephone Encounter (Signed)
Spoke with pt and advised of recs per Kerry Waters and have sched him appt with CDY for 12/24/10 at 10:45 am.

## 2010-12-24 ENCOUNTER — Encounter: Payer: Self-pay | Admitting: Internal Medicine

## 2010-12-24 ENCOUNTER — Ambulatory Visit (INDEPENDENT_AMBULATORY_CARE_PROVIDER_SITE_OTHER): Payer: Medicare Other | Admitting: Internal Medicine

## 2010-12-24 VITALS — BP 148/76 | HR 64 | Ht 72.0 in | Wt 345.2 lb

## 2010-12-24 DIAGNOSIS — G4730 Sleep apnea, unspecified: Secondary | ICD-10-CM

## 2010-12-24 DIAGNOSIS — Z23 Encounter for immunization: Secondary | ICD-10-CM

## 2010-12-24 NOTE — Patient Instructions (Signed)
Flu vax  Continue CPAP at 19. Please call as needed

## 2010-12-24 NOTE — Progress Notes (Signed)
Subjective:    Patient ID: Kerry Waters, male    DOB: Oct 24, 1945, 65 y.o.   MRN: [id number]  HPI 12/24/10- 65 year old male former smoker followed for obstructive sleep apnea complicated by obesity, HBP CAD/MI/ hx SVT. Last here May 08, 2010  Wearing right boot for diabetic ulcer/ getting hyperbaric treatments.  He continues CPAP all night every night 19 cwp/ [facility]. Compliance and effectiveness are great, with no complaints from home that he is snoring.  Denies lung concerns. Followed now by Dr Antoine Poche for cardiology.  Review of Systems Constitutional:   No-   weight loss, night sweats, fevers, chills, fatigue, lassitude. HEENT:   No-  headaches, difficulty swallowing, tooth/dental problems, sore throat,       No-  sneezing, itching, ear ache, nasal congestion, post nasal drip,  CV:  No-   chest pain, orthopnea, PND, swelling in lower extremities, anasarca, dizziness, palpitations Resp: No-   shortness of breath with exertion or at rest.              No-   productive cough,  No non-productive cough,  No-  coughing up of blood.              No-   change in color of mucus.  No- wheezing.   Skin: No-   rash or lesions. GI:  No-   heartburn, indigestion, abdominal pain, nausea, vomiting, diarrhea,                 change in bowel habits, loss of appetite GU: No-   dysuria, change in color of urine, no urgency or frequency.  No- flank pain. MS:  No-   joint pain or swelling.  No- decreased range of motion.  No- back pain. Neuro- Psych:  No- change in mood or affect. No depression or anxiety.  No memory loss.      Objective:   Physical Exam General- Alert, Oriented, Affect-appropriate, Distress- none acute   Very obese Skin- rash-none, lesions- none, excoriation- none Lymphadenopathy- none Head- atraumatic            Eyes- Gross vision intact, PERRLA, conjunctivae clear secretions            Ears- Hearing, canals - normal             Nose- Clear, No- Septal dev,  mucus, polyps, erosion, perforation             Throat- Mallampati IV , mucosa clear , drainage- none, tonsils- atrophic  Tongue coated Neck- flexible , trachea midline, no stridor , thyroid nl, carotid no bruit Chest - symmetrical excursion , unlabored           Heart/CV- RRR , no murmur , no gallop  , no rub, nl s1 s2                           - JVD- none , edema- 2+/ support hose, stasis changes- none, varices- none           Lung- clear to P&A, wheeze- none, cough- none , dullness-none, rub- none           Chest wall-  Abd- tender-no, distended-no, bowel sounds-present, HSM- no Br/ Gen/ Rectal- Not done, not indicated Extrem- cyanosis- none, clubbing, none, atrophy- none, strength- nl   Walking boot right foot.  Neuro- grossly intact to observation         Assessment & Plan:

## 2010-12-24 NOTE — Assessment & Plan Note (Addendum)
Good compliance and control. Weight loss would help. Pressure is appropriate- high at 19, but tolerated ok.

## 2010-12-26 NOTE — Assessment & Plan Note (Signed)
This is the controlling source of most of his medical problems including sleep apnea, coronary artery disease, diabetes and diabetic foot problems. I wonder if his medical problems are too complicated for consideration of bariatric surgery?

## 2010-12-31 ENCOUNTER — Ambulatory Visit: Payer: Medicare Other | Admitting: Cardiology

## 2011-01-19 ENCOUNTER — Encounter (HOSPITAL_BASED_OUTPATIENT_CLINIC_OR_DEPARTMENT_OTHER): Payer: Medicare Other | Attending: General Surgery

## 2011-01-19 DIAGNOSIS — I739 Peripheral vascular disease, unspecified: Secondary | ICD-10-CM | POA: Insufficient documentation

## 2011-01-19 DIAGNOSIS — E1142 Type 2 diabetes mellitus with diabetic polyneuropathy: Secondary | ICD-10-CM | POA: Insufficient documentation

## 2011-01-19 DIAGNOSIS — I10 Essential (primary) hypertension: Secondary | ICD-10-CM | POA: Insufficient documentation

## 2011-01-19 DIAGNOSIS — L84 Corns and callosities: Secondary | ICD-10-CM | POA: Insufficient documentation

## 2011-01-19 DIAGNOSIS — Z79899 Other long term (current) drug therapy: Secondary | ICD-10-CM | POA: Insufficient documentation

## 2011-01-19 DIAGNOSIS — E1149 Type 2 diabetes mellitus with other diabetic neurological complication: Secondary | ICD-10-CM | POA: Insufficient documentation

## 2011-01-19 DIAGNOSIS — E1169 Type 2 diabetes mellitus with other specified complication: Secondary | ICD-10-CM | POA: Insufficient documentation

## 2011-01-19 DIAGNOSIS — L97509 Non-pressure chronic ulcer of other part of unspecified foot with unspecified severity: Secondary | ICD-10-CM | POA: Insufficient documentation

## 2011-01-25 ENCOUNTER — Ambulatory Visit (INDEPENDENT_AMBULATORY_CARE_PROVIDER_SITE_OTHER): Payer: Medicare Other | Admitting: Cardiology

## 2011-01-25 ENCOUNTER — Encounter: Payer: Self-pay | Admitting: Cardiology

## 2011-01-25 VITALS — BP 152/88 | HR 57 | Ht 71.0 in | Wt 330.0 lb

## 2011-01-25 DIAGNOSIS — R0989 Other specified symptoms and signs involving the circulatory and respiratory systems: Secondary | ICD-10-CM

## 2011-01-25 DIAGNOSIS — I10 Essential (primary) hypertension: Secondary | ICD-10-CM

## 2011-01-25 DIAGNOSIS — I2510 Atherosclerotic heart disease of native coronary artery without angina pectoris: Secondary | ICD-10-CM

## 2011-01-25 NOTE — Assessment & Plan Note (Signed)
We again discussed weight loss and I hope that he is motivated.

## 2011-01-25 NOTE — Assessment & Plan Note (Signed)
The patient has no new sypmtoms.  No further cardiovascular testing is indicated.  We will continue with aggressive risk reduction and meds as listed.  

## 2011-01-25 NOTE — Assessment & Plan Note (Signed)
At this point I don't want to increase his meds as he is on multiple drugs at reasonable doses.  I will continue the meds as listed.  I will instead encourage weight loss.

## 2011-01-25 NOTE — Assessment & Plan Note (Signed)
He has a carotid bruit vs transmitted systolic murmur.  I will order a doppler.

## 2011-01-25 NOTE — Progress Notes (Signed)
HPI The patient presents as a follow up with his hypertension.  At the last appt. I switched him to labetalol as his BP was not well controlled.  His systolic BP is better but still running about 150s.  He says that he has lost weight but this is not reflected on my scales.  He has no new symptoms.  The patient denies any new symptoms such as chest discomfort, neck or arm discomfort. There has been no new shortness of breath, PND or orthopnea. There have been no reported palpitations, presyncope or syncope.    No Known Allergies  Current Outpatient Prescriptions  Medication Sig Dispense Refill  . aspirin 325 MG tablet Take 325 mg by mouth daily. 2 DAILY       . atorvastatin (LIPITOR) 40 MG tablet Take 40 mg by mouth daily.        . Coenzyme Q10 300 MG CAPS Take 1 capsule by mouth daily.        [name] diltiazem (CARDIZEM CD) 180 MG 24 hr capsule Take 180 mg by mouth 2 (two) times daily.       . Exenatide (BYETTA 5 MCG PEN [place]) Inject 10 mg into the skin 2 (two) times daily.       [name] glimepiride (AMARYL) 4 MG tablet Take 4 mg by mouth 2 (two) times daily.        [name] GLUCOSAMINE PO Take by mouth 2 (two) times daily.        . hydrochlorothiazide 25 MG tablet Take 25 mg by mouth daily.        [name] labetalol (NORMODYNE) 200 MG tablet Take 2 tablets three times a day  180 tablet  6  . lisinopril (PRINIVIL,ZESTRIL) 40 MG tablet Take 40 mg by mouth daily.        . metFORMIN (GLUCOPHAGE) 1000 MG tablet Take 1,000 mg by mouth 2 (two) times daily with a meal.        . Multiple Vitamin (MULTIVITAMIN) tablet Take 1 tablet by mouth daily.        . potassium chloride SA (K-DUR,KLOR-CON) 20 MEQ tablet Take 20 mEq by mouth 2 (two) times daily.          Past Medical History  Diagnosis Date  . Joint pain   . Ulcer of foot   . Ischemic heart disease   . MI (myocardial infarction) 1987    ANTERIOR SEPTAL  . Hypokinesis     MILD INFERIOR  . Hypertension   . Edema of lower extremity   . Obesities, morbid   . SVT  (supraventricular tachycardia)   . Hx of CABG 1993  . Abnormal cardiovascular stress test 2008    EF 48%. No clear cut ischemia. Low risk scan  . Diabetes mellitus     Dr. Timothy Lasso follows.  [name] PVD (peripheral vascular disease)   . Venous stasis   . Peripheral neuropathy   . Hyperlipidemia   . Coronary artery disease   . Sleep apnea     CPAP  . History of angina     Past Surgical History  Procedure Date  . Coronary artery bypass graft 1993    LIMA to LAD with patch angioplasty, SVG to OM, SVG to OM & PD and patch angioplasy to PDA  . Left ankle orif 2000    ROS:  As stated in the HPI and negative for all other systems.  PHYSICAL EXAM BP 152/88  Pulse 57  Ht 5\' 11"  (1.803 m)  Wt  330 lb (149.687 kg)  BMI 46.03 kg/m2 GENERAL:  Well appearing HEENT:  Pupils equal round and reactive, fundi not visualized, oral mucosa unremarkable NECK:  No jugular venous distention, waveform within normal limits, carotid upstroke brisk and symmetric, questionable left bruits, no thyromegaly LYMPHATICS:  No cervical, inguinal adenopathy LUNGS:  Clear to auscultation bilaterally BACK:  No CVA tenderness CHEST:  Well healed sternotomy scar. HEART:  PMI not displaced or sustained,S1 and S2 within normal limits, no S3, no S4, no clicks, no rubs, apical systolic murmur radiating out the outflow tract ABD:  Flat, positive bowel sounds normal in frequency in pitch, no bruits, no rebound, no guarding, no midline pulsatile mass, no hepatomegaly, no splenomegaly, morbidly obese EXT:  2 plus pulses upper, two plus PT bilateral, diabetic shoe on the right foot, no edema, no cyanosis no clubbing SKIN:  No rashes no nodules NEURO:  Cranial nerves II through XII grossly intact, motor grossly intact throughout PSYCH:  Cognitively intact, oriented to person place and time  ASSESSMENT AND PLAN

## 2011-01-25 NOTE — Patient Instructions (Signed)
The current medical regimen is effective;  continue present plan and medications.  Your physician has requested that you have a carotid duplex. This test is an ultrasound of the carotid arteries in your neck. It looks at blood flow through these arteries that supply the brain with blood. Allow one hour for this exam. There are no restrictions or special instructions.  Follow up in 6 months with Dr Hochrein.  You will receive a letter in the mail 2 months before you are due.  Please call us when you receive this letter to schedule your follow up appointment.  

## 2011-02-09 NOTE — H&P (Signed)
  Kerry Waters, Kerry Waters NO.:  [id number]  MEDICAL RECORD NO.:  [id number]           PATIENT TYPE:  I  LOCATION:  FOOT                         FACILITY:  [facility]  PHYSICIAN:  Joanne Gavel, M.D.        DATE OF BIRTH:  05/08/1945  DATE OF ADMISSION:  06/02/2010 DATE OF DISCHARGE:                             HISTORY & PHYSICAL   CHIEF COMPLAINT:  Diabetic foot ulcer right great toe.  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  He has had a large callus on the right great toe which is neuropathic.  One month ago, this started to drain a little bit.  It is now quite a bit larger.  It is not painful.  There has been no fever, chills, sweating spells.  There is a slight odor.  PAST MEDICAL HISTORY:  He has a history of hypertension, long history of diabetes, recent treatment for basal cell carcinoma of the shoulder, and coronary artery disease.  PAST SURGICAL HISTORY:  Open heart operation in 1993 and removal of basal cell of the shoulder.  MEDICATIONS:  Lipitor, potassium, metformin, glimepiride, hydrochlorothiazide, lisinopril, aspirin, multivitamins, and Zetia.  ALLERGIES:  None.  ALCOHOL:  None.  CIGARETTES:  None.  PHYSICAL EXAMINATION:  VITAL SIGNS:  Temperature 98.2, pulse 60, respirations 16, blood pressure 173/80, glucose 188. EYES, EARS, NOSE, THROAT:  Normal. NECK:  Supple. CHEST:  Clear. HEART:  Regular rhythm. ABDOMEN:  Very obese but normal to cursory exam. EXTREMITIES:  The left shoe and sock were not removed.  Examination of the right foot reveals a 1.0 x 0.5 callus which after debridement is a 1.4 x 0.8 x 0.4 wound.  This goes down to tendon but does not probe to bone.  There is good skin nutrition with a good posterior tibial pulse. Dorsalis pedis pulse not palpable.  IMPRESSION:  Diabetic foot ulcer, rule out  osteomyelitis.  PLAN:  Santyl, hydrogel, and healing sandal.  We will get vascular studies and x-ray of the foot to rule out osteo, and he is a possible candidate for TCC or possibly hyperbaric treatment.     Joanne Gavel, M.D.     RA/MEDQ  D:  06/02/2010  T:  06/03/2010  Job:  [id number]  cc:   Gwen Pounds, MD  Electronically Signed by Joanne Gavel M.D. on 02/09/2011 08:49:11 AM

## 2011-02-14 ENCOUNTER — Other Ambulatory Visit: Payer: Self-pay | Admitting: Cardiology

## 2011-02-23 ENCOUNTER — Encounter (HOSPITAL_BASED_OUTPATIENT_CLINIC_OR_DEPARTMENT_OTHER): Payer: Medicare Other | Attending: General Surgery

## 2011-02-23 DIAGNOSIS — E1169 Type 2 diabetes mellitus with other specified complication: Secondary | ICD-10-CM | POA: Insufficient documentation

## 2011-02-23 DIAGNOSIS — L84 Corns and callosities: Secondary | ICD-10-CM | POA: Insufficient documentation

## 2011-02-23 DIAGNOSIS — E1149 Type 2 diabetes mellitus with other diabetic neurological complication: Secondary | ICD-10-CM | POA: Insufficient documentation

## 2011-02-23 DIAGNOSIS — I10 Essential (primary) hypertension: Secondary | ICD-10-CM | POA: Insufficient documentation

## 2011-02-23 DIAGNOSIS — Z79899 Other long term (current) drug therapy: Secondary | ICD-10-CM | POA: Insufficient documentation

## 2011-02-23 DIAGNOSIS — I739 Peripheral vascular disease, unspecified: Secondary | ICD-10-CM | POA: Insufficient documentation

## 2011-02-23 DIAGNOSIS — E1142 Type 2 diabetes mellitus with diabetic polyneuropathy: Secondary | ICD-10-CM | POA: Insufficient documentation

## 2011-02-23 DIAGNOSIS — L97509 Non-pressure chronic ulcer of other part of unspecified foot with unspecified severity: Secondary | ICD-10-CM | POA: Insufficient documentation

## 2011-03-03 ENCOUNTER — Encounter (INDEPENDENT_AMBULATORY_CARE_PROVIDER_SITE_OTHER): Payer: Medicare Other | Admitting: Cardiology

## 2011-03-03 DIAGNOSIS — I6529 Occlusion and stenosis of unspecified carotid artery: Secondary | ICD-10-CM

## 2011-03-03 DIAGNOSIS — R0989 Other specified symptoms and signs involving the circulatory and respiratory systems: Secondary | ICD-10-CM

## 2011-03-04 ENCOUNTER — Other Ambulatory Visit: Payer: Self-pay | Admitting: *Deleted

## 2011-03-04 DIAGNOSIS — I10 Essential (primary) hypertension: Secondary | ICD-10-CM

## 2011-03-04 MED ORDER — LABETALOL HCL 200 MG PO TABS: ORAL_TABLET | ORAL | Status: DC

## 2011-04-06 ENCOUNTER — Encounter (HOSPITAL_BASED_OUTPATIENT_CLINIC_OR_DEPARTMENT_OTHER): Payer: Medicare Other | Attending: General Surgery

## 2011-04-06 DIAGNOSIS — Z7982 Long term (current) use of aspirin: Secondary | ICD-10-CM | POA: Insufficient documentation

## 2011-04-06 DIAGNOSIS — I739 Peripheral vascular disease, unspecified: Secondary | ICD-10-CM | POA: Insufficient documentation

## 2011-04-06 DIAGNOSIS — L97509 Non-pressure chronic ulcer of other part of unspecified foot with unspecified severity: Secondary | ICD-10-CM | POA: Insufficient documentation

## 2011-04-06 DIAGNOSIS — I10 Essential (primary) hypertension: Secondary | ICD-10-CM | POA: Insufficient documentation

## 2011-04-06 DIAGNOSIS — E1169 Type 2 diabetes mellitus with other specified complication: Secondary | ICD-10-CM | POA: Insufficient documentation

## 2011-04-06 DIAGNOSIS — Z79899 Other long term (current) drug therapy: Secondary | ICD-10-CM | POA: Insufficient documentation

## 2011-04-23 ENCOUNTER — Other Ambulatory Visit: Payer: Self-pay | Admitting: Cardiology

## 2011-05-04 ENCOUNTER — Encounter (HOSPITAL_BASED_OUTPATIENT_CLINIC_OR_DEPARTMENT_OTHER): Payer: Medicare Other | Attending: General Surgery

## 2011-05-04 DIAGNOSIS — I10 Essential (primary) hypertension: Secondary | ICD-10-CM | POA: Insufficient documentation

## 2011-05-04 DIAGNOSIS — Z79899 Other long term (current) drug therapy: Secondary | ICD-10-CM | POA: Insufficient documentation

## 2011-05-04 DIAGNOSIS — L97509 Non-pressure chronic ulcer of other part of unspecified foot with unspecified severity: Secondary | ICD-10-CM | POA: Insufficient documentation

## 2011-05-04 DIAGNOSIS — Z7982 Long term (current) use of aspirin: Secondary | ICD-10-CM | POA: Insufficient documentation

## 2011-05-04 DIAGNOSIS — I739 Peripheral vascular disease, unspecified: Secondary | ICD-10-CM | POA: Insufficient documentation

## 2011-05-04 DIAGNOSIS — E1169 Type 2 diabetes mellitus with other specified complication: Secondary | ICD-10-CM | POA: Insufficient documentation

## 2011-05-07 ENCOUNTER — Ambulatory Visit: Payer: Self-pay | Admitting: Internal Medicine

## 2011-05-11 ENCOUNTER — Encounter (HOSPITAL_BASED_OUTPATIENT_CLINIC_OR_DEPARTMENT_OTHER): Payer: Medicare Other | Attending: General Surgery

## 2011-05-11 DIAGNOSIS — L97509 Non-pressure chronic ulcer of other part of unspecified foot with unspecified severity: Secondary | ICD-10-CM | POA: Insufficient documentation

## 2011-05-11 DIAGNOSIS — E1169 Type 2 diabetes mellitus with other specified complication: Secondary | ICD-10-CM | POA: Insufficient documentation

## 2011-05-11 DIAGNOSIS — Z7982 Long term (current) use of aspirin: Secondary | ICD-10-CM | POA: Insufficient documentation

## 2011-05-11 DIAGNOSIS — I739 Peripheral vascular disease, unspecified: Secondary | ICD-10-CM | POA: Insufficient documentation

## 2011-05-11 DIAGNOSIS — L84 Corns and callosities: Secondary | ICD-10-CM | POA: Insufficient documentation

## 2011-05-11 DIAGNOSIS — Z79899 Other long term (current) drug therapy: Secondary | ICD-10-CM | POA: Insufficient documentation

## 2011-05-11 DIAGNOSIS — I10 Essential (primary) hypertension: Secondary | ICD-10-CM | POA: Diagnosis not present

## 2011-06-07 ENCOUNTER — Other Ambulatory Visit: Payer: Self-pay | Admitting: Nurse Practitioner

## 2011-06-08 ENCOUNTER — Other Ambulatory Visit: Payer: Self-pay | Admitting: *Deleted

## 2011-06-15 ENCOUNTER — Encounter (HOSPITAL_BASED_OUTPATIENT_CLINIC_OR_DEPARTMENT_OTHER): Payer: Medicare Other

## 2011-06-17 DIAGNOSIS — H524 Presbyopia: Secondary | ICD-10-CM | POA: Diagnosis not present

## 2011-06-17 DIAGNOSIS — H353290 Exudative age-related macular degeneration, unspecified eye, stage unspecified: Secondary | ICD-10-CM | POA: Diagnosis not present

## 2011-06-17 DIAGNOSIS — H2510 Age-related nuclear cataract, unspecified eye: Secondary | ICD-10-CM | POA: Diagnosis not present

## 2011-06-23 DIAGNOSIS — I10 Essential (primary) hypertension: Secondary | ICD-10-CM | POA: Diagnosis not present

## 2011-06-23 DIAGNOSIS — E785 Hyperlipidemia, unspecified: Secondary | ICD-10-CM | POA: Diagnosis not present

## 2011-06-23 DIAGNOSIS — L97509 Non-pressure chronic ulcer of other part of unspecified foot with unspecified severity: Secondary | ICD-10-CM | POA: Diagnosis not present

## 2011-06-23 DIAGNOSIS — E1149 Type 2 diabetes mellitus with other diabetic neurological complication: Secondary | ICD-10-CM | POA: Diagnosis not present

## 2011-08-03 DIAGNOSIS — H2510 Age-related nuclear cataract, unspecified eye: Secondary | ICD-10-CM | POA: Diagnosis not present

## 2011-08-03 DIAGNOSIS — H18419 Arcus senilis, unspecified eye: Secondary | ICD-10-CM | POA: Diagnosis not present

## 2011-08-06 ENCOUNTER — Encounter: Payer: Self-pay | Admitting: Cardiology

## 2011-08-06 ENCOUNTER — Ambulatory Visit (INDEPENDENT_AMBULATORY_CARE_PROVIDER_SITE_OTHER): Payer: Medicare Other | Admitting: Cardiology

## 2011-08-06 VITALS — BP 170/90 | HR 55 | Ht 71.0 in | Wt 325.3 lb

## 2011-08-06 DIAGNOSIS — R0989 Other specified symptoms and signs involving the circulatory and respiratory systems: Secondary | ICD-10-CM

## 2011-08-06 DIAGNOSIS — I10 Essential (primary) hypertension: Secondary | ICD-10-CM

## 2011-08-06 DIAGNOSIS — I2510 Atherosclerotic heart disease of native coronary artery without angina pectoris: Secondary | ICD-10-CM

## 2011-08-06 MED ORDER — SPIRONOLACTONE 25 MG PO TABS: 25.0000 mg | ORAL_TABLET | Freq: Every day | ORAL | Status: DC

## 2011-08-06 NOTE — Assessment & Plan Note (Signed)
The patient has no new sypmtoms.  No further cardiovascular testing is indicated.  We will continue with aggressive risk reduction and meds as listed.  

## 2011-08-06 NOTE — Assessment & Plan Note (Signed)
His blood pressure is not well controlled. I will stop his potassium. I'm going to add spironolactone. I do note he's had normal renal function previously. In one week he'll check a basic metabolic profile. He will have another basic metabolic profile in one month. He will continue to keep his blood pressure diary.

## 2011-08-06 NOTE — Assessment & Plan Note (Signed)
He had mild plaquing he will have followup next year.

## 2011-08-06 NOTE — Progress Notes (Signed)
HPI The patient presents as a follow up with his hypertension.  At the last appt. I did not adjust his medications. Since then he has continued to lose weight and is down about 20 pounds. Unfortunately his blood pressure remains high. It was consistently high so he stopped taking it at home.  He denies any new cardiovascular symptoms. The patient denies any new symptoms such as chest discomfort, neck or arm discomfort. There has been no new shortness of breath, PND or orthopnea. There have been no reported palpitations, presyncope or syncope.  He is doing some activity and walking.  No Known Allergies  Current Outpatient Prescriptions  Medication Sig Dispense Refill  . aspirin 325 MG tablet Take 325 mg by mouth daily. 2 DAILY       . atorvastatin (LIPITOR) 40 MG tablet Take 40 mg by mouth daily.        . Coenzyme Q10 300 MG CAPS Take 1 capsule by mouth daily.        [name] diltiazem (CARDIZEM CD) 180 MG 24 hr capsule TAKE ONE CAPSULE BY MOUTH TWICE DAILY  180 capsule  3  . Exenatide (BYETTA 5 MCG PEN [place]) Inject 10 mg into the skin 2 (two) times daily.       [name] glimepiride (AMARYL) 4 MG tablet Take 4 mg by mouth 2 (two) times daily.        [name] GLUCOSAMINE PO Take by mouth 2 (two) times daily.        . hydrochlorothiazide (HYDRODIURIL) 25 MG tablet TAKE ONE TABLET BY MOUTH EVERY DAY  90 tablet  1  . labetalol (NORMODYNE) 200 MG tablet Take 2 tablets three times a day  540 tablet  2  . lisinopril (PRINIVIL,ZESTRIL) 40 MG tablet TAKE ONE TABLET BY MOUTH EVERY DAY  90 tablet  2  . metFORMIN (GLUCOPHAGE) 1000 MG tablet Take 1,000 mg by mouth 2 (two) times daily with a meal.        . Multiple Vitamin (MULTIVITAMIN) tablet Take 1 tablet by mouth daily.        . potassium chloride SA (K-DUR,KLOR-CON) 20 MEQ tablet Take 20 mEq by mouth 2 (two) times daily.          Past Medical History  Diagnosis Date  . Joint pain   . Ulcer of foot   . Ischemic heart disease   . MI (myocardial infarction) 1987   ANTERIOR SEPTAL  . Hypokinesis     MILD INFERIOR  . Hypertension   . Edema of lower extremity   . Obesities, morbid   . SVT (supraventricular tachycardia)   . Hx of CABG 1993  . Abnormal cardiovascular stress test 2008    EF 48%. No clear cut ischemia. Low risk scan  . Diabetes mellitus     Dr. Timothy Lasso follows.  [name] PVD (peripheral vascular disease)   . Venous stasis   . Peripheral neuropathy   . Hyperlipidemia   . Coronary artery disease   . Sleep apnea     CPAP  . History of angina     Past Surgical History  Procedure Date  . Coronary artery bypass graft 1993    LIMA to LAD with patch angioplasty, SVG to OM, SVG to OM & PD and patch angioplasy to PDA  . Left ankle orif 2000    ROS:  As stated in the HPI and negative for all other systems.  PHYSICAL EXAM BP 170/90  Pulse 55  Ht 5\' 11"  (1.803 m)  Wt 325 lb 4.8 oz (147.555 kg)  BMI 45.37 kg/m2 GENERAL:  Well appearing HEENT:  Pupils equal round and reactive, fundi not visualized, oral mucosa unremarkable NECK:  No jugular venous distention, waveform within normal limits, carotid upstroke brisk and symmetric, questionable left bruits, no thyromegaly LYMPHATICS:  No cervical, inguinal adenopathy LUNGS:  Clear to auscultation bilaterally BACK:  No CVA tenderness CHEST:  Well healed sternotomy scar. HEART:  PMI not displaced or sustained,S1 and S2 within normal limits, no S3, no S4, no clicks, no rubs, apical systolic murmur radiating out the outflow tract ABD:  Flat, positive bowel sounds normal in frequency in pitch, no bruits, no rebound, no guarding, no midline pulsatile mass, no hepatomegaly, no splenomegaly, morbidly obese EXT:  2 plus pulses upper, two plus PT bilateral, no edema, no cyanosis no clubbing SKIN:  No rashes no nodules NEURO:  Cranial nerves II through XII grossly intact, motor grossly intact throughout PSYCH:  Cognitively intact, oriented to person place and time  EKG:  Sinus rhythm with premature atrial  contractions, axis within normal limits, intervals within normal limits, no acute ST-T wave changes. Rate 55. 08/06/2011  ASSESSMENT AND PLAN

## 2011-08-06 NOTE — Patient Instructions (Signed)
Please stop potassium chloride Start Spironolactone 25 mg a day Continue all other medications as listed.  Have blood work in 1 week and in 1 month.  (BMP)  Follow up with Dr Antoine Poche in 1 month

## 2011-08-06 NOTE — Assessment & Plan Note (Signed)
I applauded his weight loss and encourage more of the same.

## 2011-08-13 ENCOUNTER — Other Ambulatory Visit (INDEPENDENT_AMBULATORY_CARE_PROVIDER_SITE_OTHER): Payer: Medicare Other

## 2011-08-13 ENCOUNTER — Telehealth: Payer: Self-pay | Admitting: *Deleted

## 2011-08-13 DIAGNOSIS — I2510 Atherosclerotic heart disease of native coronary artery without angina pectoris: Secondary | ICD-10-CM | POA: Diagnosis not present

## 2011-08-13 LAB — BASIC METABOLIC PANEL
BUN: 14 mg/dL (ref 6–23)
CO2: 24 mEq/L (ref 19–32)
Chloride: 101 mEq/L (ref 96–112)
GFR: 85.19 mL/min (ref >60.00)
Glucose, Bld: 259 mg/dL — ABNORMAL HIGH (ref 70–99)
Potassium: 4.4 mEq/L (ref 3.5–5.1)

## 2011-08-13 NOTE — Telephone Encounter (Signed)
Dr hochrein--mr Kerry Waters here to compare his BP machine with our cuff and machine --       his=148/71--ours=136/64-- there appears to be about 10mm  difference--his records of his BP recently= 4/21- 9am=174/106 5pm=159/89 4/22 6am=159/77 5pm=130/66(irregular) 4/24 6am=157/96 7pm=171/90 4/25 7am=1171/90

## 2011-08-19 NOTE — Telephone Encounter (Signed)
I would like to have one month of readings before adjusting the medications further.

## 2011-08-20 ENCOUNTER — Other Ambulatory Visit: Payer: Self-pay | Admitting: Nurse Practitioner

## 2011-08-23 NOTE — Telephone Encounter (Signed)
..   Requested Prescriptions   Pending Prescriptions Disp Refills  . atorvastatin (LIPITOR) 40 MG tablet [Pharmacy Med Name: ATORVASTATIN 40MG    TAB] 30 tablet 11    Sig: TAKE ONE TABLET BY MOUTH AT BEDTIME

## 2011-09-13 DIAGNOSIS — H269 Unspecified cataract: Secondary | ICD-10-CM | POA: Diagnosis not present

## 2011-09-13 DIAGNOSIS — H2510 Age-related nuclear cataract, unspecified eye: Secondary | ICD-10-CM | POA: Diagnosis not present

## 2011-09-14 DIAGNOSIS — H2510 Age-related nuclear cataract, unspecified eye: Secondary | ICD-10-CM | POA: Diagnosis not present

## 2011-09-16 ENCOUNTER — Encounter: Payer: Self-pay | Admitting: Cardiology

## 2011-09-16 ENCOUNTER — Ambulatory Visit (INDEPENDENT_AMBULATORY_CARE_PROVIDER_SITE_OTHER): Payer: Medicare Other | Admitting: Cardiology

## 2011-09-16 VITALS — BP 140/75 | HR 60 | Ht 71.0 in | Wt 317.4 lb

## 2011-09-16 DIAGNOSIS — R5381 Other malaise: Secondary | ICD-10-CM | POA: Diagnosis not present

## 2011-09-16 DIAGNOSIS — R5383 Other fatigue: Secondary | ICD-10-CM

## 2011-09-16 DIAGNOSIS — I2510 Atherosclerotic heart disease of native coronary artery without angina pectoris: Secondary | ICD-10-CM

## 2011-09-16 NOTE — Progress Notes (Signed)
HPI The patient presents as a follow up with his hypertension.  At the last visit we started spironolactone. He did well with this. His blood pressures have been in the 140s systolic with diastolics in the 70s.  He continues to lose weight.  The patient denies any new symptoms such as chest discomfort, neck or arm discomfort. There has been no new shortness of breath, PND or orthopnea. There have been no reported palpitations, presyncope or syncope.  He is very fatigued however.  He says he sleeps well and he uses his CPAP.   No Known Allergies  Current Outpatient Prescriptions  Medication Sig Dispense Refill  . aspirin 325 MG tablet Take 325 mg by mouth daily. 2 DAILY       . atorvastatin (LIPITOR) 40 MG tablet TAKE ONE TABLET BY MOUTH AT BEDTIME  30 tablet  11  . Coenzyme Q10 300 MG CAPS Take 1 capsule by mouth daily.        [name] diltiazem (CARDIZEM CD) 180 MG 24 hr capsule TAKE ONE CAPSULE BY MOUTH TWICE DAILY  180 capsule  3  . Exenatide (BYETTA 5 MCG PEN [place]) Inject 10 mg into the skin 2 (two) times daily.       [name] glimepiride (AMARYL) 4 MG tablet Take 4 mg by mouth 2 (two) times daily.        [name] GLUCOSAMINE PO Take by mouth 2 (two) times daily.        . hydrochlorothiazide (HYDRODIURIL) 25 MG tablet TAKE ONE TABLET BY MOUTH EVERY DAY  90 tablet  1  . labetalol (NORMODYNE) 200 MG tablet Take 2 tablets three times a day  540 tablet  2  . lisinopril (PRINIVIL,ZESTRIL) 40 MG tablet       . metFORMIN (GLUCOPHAGE) 1000 MG tablet Take 1,000 mg by mouth 2 (two) times daily with a meal.        . Multiple Vitamin (MULTIVITAMIN) tablet Take 1 tablet by mouth daily.        [name] spironolactone (ALDACTONE) 25 MG tablet Take 1 tablet (25 mg total) by mouth daily.  30 tablet  6  . DISCONTD: lisinopril (PRINIVIL,ZESTRIL) 40 MG tablet TAKE ONE TABLET BY MOUTH EVERY DAY  90 tablet  2    Past Medical History  Diagnosis Date  . Joint pain   . Ulcer of foot   . Ischemic heart disease   . MI (myocardial  infarction) 1987    ANTERIOR SEPTAL  . Hypokinesis     MILD INFERIOR  . Hypertension   . Edema of lower extremity   . Obesities, morbid   . SVT (supraventricular tachycardia)   . Hx of CABG 1993  . Abnormal cardiovascular stress test 2008    EF 48%. No clear cut ischemia. Low risk scan  . Diabetes mellitus     Dr. Timothy Lasso follows.  [name] PVD (peripheral vascular disease)   . Venous stasis   . Peripheral neuropathy   . Hyperlipidemia   . Coronary artery disease   . Sleep apnea     CPAP  . History of angina     Past Surgical History  Procedure Date  . Coronary artery bypass graft 1993    LIMA to LAD with patch angioplasty, SVG to OM, SVG to OM & PD and patch angioplasy to PDA  . Left ankle orif 2000    ROS:  As stated in the HPI and negative for all other systems.  PHYSICAL EXAM BP 140/75  Pulse  60  Ht 5\' 11"  (1.803 m)  Wt 317 lb 6.4 oz (143.972 kg)  BMI 44.27 kg/m2 GENERAL:  Well appearing HEENT:  Pupils equal round and reactive, fundi not visualized, oral mucosa unremarkable NECK:  No jugular venous distention, waveform within normal limits, carotid upstroke brisk and symmetric, questionable left bruits, no thyromegaly LYMPHATICS:  No cervical, inguinal adenopathy LUNGS:  Clear to auscultation bilaterally BACK:  No CVA tenderness CHEST:  Well healed sternotomy scar. HEART:  PMI not displaced or sustained,S1 and S2 within normal limits, no S3, no S4, no clicks, no rubs, apical systolic murmur radiating out the outflow tract ABD:  Flat, positive bowel sounds normal in frequency in pitch, no bruits, no rebound, no guarding, no midline pulsatile mass, no hepatomegaly, no splenomegaly, morbidly obese EXT:  2 plus pulses upper, two plus PT bilateral, no edema, no cyanosis no clubbing SKIN:  No rashes no nodules NEURO:  Cranial nerves II through XII grossly intact, motor grossly intact throughout PSYCH:  Cognitively intact, oriented to person place and time  ASSESSMENT AND  PLAN

## 2011-09-16 NOTE — Assessment & Plan Note (Signed)
The patient has no new sypmtoms.  No further cardiovascular testing is indicated.  We will continue with aggressive risk reduction and meds as listed.  

## 2011-09-16 NOTE — Assessment & Plan Note (Signed)
I will check a TSH and CBC

## 2011-09-16 NOTE — Assessment & Plan Note (Signed)
His blood pressure is much better.  He will stay on the meds as listed and he will keep a BP diary.  I will check a BMET today.

## 2011-09-16 NOTE — Patient Instructions (Signed)
The current medical regimen is effective;  continue present plan and medications.  Please have blood work today (TSH, CBC and BMP)  Follow up in 6 months with Dr Antoine Poche.  You will receive a letter in the mail 2 months before you are due.  Please call us when you receive this letter to schedule your follow up appointment.

## 2011-09-16 NOTE — Assessment & Plan Note (Signed)
I continue to applaud his weight loss and encourage more of this.

## 2011-09-17 LAB — BASIC METABOLIC PANEL
BUN: 25 mg/dL — ABNORMAL HIGH (ref 6–23)
CO2: 23 mEq/L (ref 19–32)
Calcium: 9.4 mg/dL (ref 8.4–10.5)
Creatinine, Ser: 1 mg/dL (ref 0.4–1.5)
Glucose, Bld: 268 mg/dL — ABNORMAL HIGH (ref 70–99)

## 2011-09-17 LAB — CBC WITH DIFFERENTIAL/PLATELET
Eosinophils Absolute: 0.5 10*3/uL (ref 0.0–0.7)
Lymphs Abs: 2.2 10*3/uL (ref 0.7–4.0)
MCHC: 34.4 g/dL (ref 30.0–36.0)
MCV: 91.6 fl (ref 78.0–100.0)
Monocytes Absolute: 0.4 10*3/uL (ref 0.1–1.0)
Neutrophils Relative %: 53.8 % (ref 43.0–77.0)
Platelets: 162 10*3/uL (ref 150.0–400.0)

## 2011-10-04 DIAGNOSIS — H269 Unspecified cataract: Secondary | ICD-10-CM | POA: Diagnosis not present

## 2011-10-04 DIAGNOSIS — H2510 Age-related nuclear cataract, unspecified eye: Secondary | ICD-10-CM | POA: Diagnosis not present

## 2011-10-05 ENCOUNTER — Telehealth: Payer: Self-pay

## 2011-10-05 ENCOUNTER — Other Ambulatory Visit: Payer: Self-pay | Admitting: *Deleted

## 2011-10-05 MED ORDER — SPIRONOLACTONE 25 MG PO TABS: 25.0000 mg | ORAL_TABLET | Freq: Every day | ORAL | Status: DC

## 2011-10-05 NOTE — Telephone Encounter (Signed)
Patient was request 90 supply refills on his atorvastatin 40 mg, which was refilled on 08-23-11 for 30 tabs and 11 refills. I ask [facility] to turn his rx from 08/23/11 into a 90 day supply 3 refills

## 2011-10-07 DIAGNOSIS — E1149 Type 2 diabetes mellitus with other diabetic neurological complication: Secondary | ICD-10-CM | POA: Diagnosis not present

## 2011-10-07 DIAGNOSIS — L97509 Non-pressure chronic ulcer of other part of unspecified foot with unspecified severity: Secondary | ICD-10-CM | POA: Diagnosis not present

## 2011-10-07 DIAGNOSIS — I10 Essential (primary) hypertension: Secondary | ICD-10-CM | POA: Diagnosis not present

## 2011-10-07 DIAGNOSIS — I2589 Other forms of chronic ischemic heart disease: Secondary | ICD-10-CM | POA: Diagnosis not present

## 2011-10-07 DIAGNOSIS — I2510 Atherosclerotic heart disease of native coronary artery without angina pectoris: Secondary | ICD-10-CM | POA: Diagnosis not present

## 2011-10-19 ENCOUNTER — Encounter (HOSPITAL_BASED_OUTPATIENT_CLINIC_OR_DEPARTMENT_OTHER): Payer: Medicare Other | Attending: General Surgery

## 2011-10-19 DIAGNOSIS — I10 Essential (primary) hypertension: Secondary | ICD-10-CM | POA: Diagnosis not present

## 2011-10-19 DIAGNOSIS — E1169 Type 2 diabetes mellitus with other specified complication: Secondary | ICD-10-CM | POA: Diagnosis not present

## 2011-10-19 DIAGNOSIS — Z79899 Other long term (current) drug therapy: Secondary | ICD-10-CM | POA: Insufficient documentation

## 2011-10-19 DIAGNOSIS — L97509 Non-pressure chronic ulcer of other part of unspecified foot with unspecified severity: Secondary | ICD-10-CM | POA: Insufficient documentation

## 2011-10-19 DIAGNOSIS — I739 Peripheral vascular disease, unspecified: Secondary | ICD-10-CM | POA: Diagnosis not present

## 2011-10-19 NOTE — H&P (Signed)
Kerry Waters, SPARKS NO.:  [id number]  MEDICAL RECORD NO.:  [id number]  LOCATION:  FOOT                         FACILITY:  [facility]  PHYSICIAN:  Joanne Gavel, M.D.        DATE OF BIRTH:  December 20, 1945  DATE OF ADMISSION:  10/19/2011 DATE OF DISCHARGE:                             HISTORY & PHYSICAL   CHIEF COMPLAINT:  Wound, right great toe.  HISTORY OF PRESENT ILLNESS:  This diabetic male has had a wound on this toe several times.  It was last healed out December 2012.  He has a rather profound neuropathy.  He is wearing diabetic shoes.  He had a callus there which became black approximately 3 weeks ago.  After debridement today, there was clearcut small ulceration.  PAST MEDICAL HISTORY:  Hypertension, diabetes, neuropathy, peripheral vascular disease.  PAST SURGICAL HISTORY:  Ankle surgery in 2000, open heart surgery in 1993, basal cell carcinoma of the right shoulder, bilateral cataracts in 2012.  Cigarettes none.  Alcohol none.  MEDICATIONS:  Glucosamine, metformin, lisinopril, Lipitor, hydrochlorothiazide, glimepiride, diltiazem, spironolactone, labetalol, [place].  ALLERGIES:  None.  REVIEW OF SYSTEMS:  As above.  PHYSICAL EXAMINATION:  VITAL SIGNS:  Temperature 98.3, pulse 53 regular, respirations 16, blood pressure 136/74. GENERAL:  Well developed, well nourished, normal symmetrical strength. CHEST:  Clear. HEART:  Regular rhythm.  The patient has palpable pulses.  ABI is 0.08.  After debridement of a sizable callus, there is a 0.9 x 0.7 ulceration on the plantar surface of the right great toe.  IMPRESSION:  Diabetic foot ulcer.  PLAN OF TREATMENT:  Donut to help him offloading, collagen to the base of the wound.  Knee walker to completely offload if possible.  We will see him in 7 days.     Joanne Gavel, M.D.     RA/MEDQ  D:  10/19/2011  T:  10/19/2011  Job:  [id number]

## 2011-11-02 DIAGNOSIS — I10 Essential (primary) hypertension: Secondary | ICD-10-CM | POA: Diagnosis not present

## 2011-11-02 DIAGNOSIS — I739 Peripheral vascular disease, unspecified: Secondary | ICD-10-CM | POA: Diagnosis not present

## 2011-11-02 DIAGNOSIS — L97509 Non-pressure chronic ulcer of other part of unspecified foot with unspecified severity: Secondary | ICD-10-CM | POA: Diagnosis not present

## 2011-11-02 DIAGNOSIS — E1169 Type 2 diabetes mellitus with other specified complication: Secondary | ICD-10-CM | POA: Diagnosis not present

## 2011-11-09 DIAGNOSIS — I10 Essential (primary) hypertension: Secondary | ICD-10-CM | POA: Diagnosis not present

## 2011-11-09 DIAGNOSIS — I739 Peripheral vascular disease, unspecified: Secondary | ICD-10-CM | POA: Diagnosis not present

## 2011-11-09 DIAGNOSIS — L97509 Non-pressure chronic ulcer of other part of unspecified foot with unspecified severity: Secondary | ICD-10-CM | POA: Diagnosis not present

## 2011-11-09 DIAGNOSIS — E1169 Type 2 diabetes mellitus with other specified complication: Secondary | ICD-10-CM | POA: Diagnosis not present

## 2011-11-16 DIAGNOSIS — E1169 Type 2 diabetes mellitus with other specified complication: Secondary | ICD-10-CM | POA: Diagnosis not present

## 2011-11-16 DIAGNOSIS — I739 Peripheral vascular disease, unspecified: Secondary | ICD-10-CM | POA: Diagnosis not present

## 2011-11-16 DIAGNOSIS — L97509 Non-pressure chronic ulcer of other part of unspecified foot with unspecified severity: Secondary | ICD-10-CM | POA: Diagnosis not present

## 2011-11-16 DIAGNOSIS — I10 Essential (primary) hypertension: Secondary | ICD-10-CM | POA: Diagnosis not present

## 2011-11-23 ENCOUNTER — Encounter (HOSPITAL_BASED_OUTPATIENT_CLINIC_OR_DEPARTMENT_OTHER): Payer: Medicare Other | Attending: General Surgery

## 2011-11-23 ENCOUNTER — Encounter (HOSPITAL_BASED_OUTPATIENT_CLINIC_OR_DEPARTMENT_OTHER): Payer: Medicare Other

## 2011-11-23 DIAGNOSIS — L84 Corns and callosities: Secondary | ICD-10-CM | POA: Diagnosis not present

## 2011-11-23 DIAGNOSIS — E1169 Type 2 diabetes mellitus with other specified complication: Secondary | ICD-10-CM | POA: Diagnosis not present

## 2011-11-23 DIAGNOSIS — Z79899 Other long term (current) drug therapy: Secondary | ICD-10-CM | POA: Insufficient documentation

## 2011-11-23 DIAGNOSIS — I739 Peripheral vascular disease, unspecified: Secondary | ICD-10-CM | POA: Insufficient documentation

## 2011-11-23 DIAGNOSIS — I10 Essential (primary) hypertension: Secondary | ICD-10-CM | POA: Diagnosis not present

## 2011-11-23 DIAGNOSIS — L97509 Non-pressure chronic ulcer of other part of unspecified foot with unspecified severity: Secondary | ICD-10-CM | POA: Insufficient documentation

## 2011-11-30 DIAGNOSIS — I10 Essential (primary) hypertension: Secondary | ICD-10-CM | POA: Diagnosis not present

## 2011-11-30 DIAGNOSIS — L97509 Non-pressure chronic ulcer of other part of unspecified foot with unspecified severity: Secondary | ICD-10-CM | POA: Diagnosis not present

## 2011-11-30 DIAGNOSIS — L84 Corns and callosities: Secondary | ICD-10-CM | POA: Diagnosis not present

## 2011-11-30 DIAGNOSIS — E1169 Type 2 diabetes mellitus with other specified complication: Secondary | ICD-10-CM | POA: Diagnosis not present

## 2011-12-07 DIAGNOSIS — L84 Corns and callosities: Secondary | ICD-10-CM | POA: Diagnosis not present

## 2011-12-07 DIAGNOSIS — I10 Essential (primary) hypertension: Secondary | ICD-10-CM | POA: Diagnosis not present

## 2011-12-07 DIAGNOSIS — E1169 Type 2 diabetes mellitus with other specified complication: Secondary | ICD-10-CM | POA: Diagnosis not present

## 2011-12-07 DIAGNOSIS — L97509 Non-pressure chronic ulcer of other part of unspecified foot with unspecified severity: Secondary | ICD-10-CM | POA: Diagnosis not present

## 2011-12-17 ENCOUNTER — Other Ambulatory Visit: Payer: Self-pay | Admitting: Cardiology

## 2011-12-21 NOTE — Telephone Encounter (Signed)
..   Requested Prescriptions   Pending Prescriptions Disp Refills  . hydrochlorothiazide (HYDRODIURIL) 25 MG tablet [Pharmacy Med Name: HYDROCHLOROTHIAZIDE 25MG  TAB] 30 tablet 6    Sig: TAKE ONE TABLET BY MOUTH EVERY DAY

## 2011-12-24 ENCOUNTER — Encounter: Payer: Self-pay | Admitting: Internal Medicine

## 2011-12-24 ENCOUNTER — Ambulatory Visit (INDEPENDENT_AMBULATORY_CARE_PROVIDER_SITE_OTHER): Payer: Medicare Other | Admitting: Internal Medicine

## 2011-12-24 VITALS — BP 130/64 | HR 83 | Ht 72.0 in | Wt 337.0 lb

## 2011-12-24 DIAGNOSIS — G4733 Obstructive sleep apnea (adult) (pediatric): Secondary | ICD-10-CM | POA: Diagnosis not present

## 2011-12-24 DIAGNOSIS — J00 Acute nasopharyngitis [common cold]: Secondary | ICD-10-CM

## 2011-12-24 NOTE — Patient Instructions (Addendum)
Ok to continue CPAP at 19 cwp  Please call as needed

## 2011-12-24 NOTE — Progress Notes (Signed)
Subjective:    Patient ID: Kerry Waters, male    DOB: 1945/08/11, 66 y.o.   MRN: [id number]  HPI 12/24/10- 66 year old male former smoker followed for obstructive sleep apnea complicated by obesity, HBP CAD/MI/ hx SVT. Last here May 08, 2010  Wearing right boot for diabetic ulcer/ getting hyperbaric treatments.  He continues CPAP all night every night 19 cwp/ [facility]. Compliance and effectiveness are great, with no complaints from home that he is snoring.  Denies lung concerns. Followed now by Dr Antoine Poche for cardiology.   12/24/11- 66 year old male former smoker followed for obstructive sleep apnea complicated by obesity, HBP CAD/MI/ hx SVT. Wears CPAP19/ [facility] every night for approximately 8 hours; pressure doing well for patient. Rhinitis-mild nasal congestion and some wheeze in the last week. Does not feel sick. He wants to wait on flu vaccine due to this.  Review of Systems- HPI Constitutional:   No-   weight loss, night sweats, fevers, chills, fatigue, lassitude. HEENT:   No-  headaches, difficulty swallowing, tooth/dental problems, sore throat,       No-  sneezing, itching, ear ache, nasal congestion, post nasal drip,  CV:  No-   chest pain, orthopnea, PND, swelling in lower extremities, anasarca, dizziness, palpitations Resp: No-   shortness of breath with exertion or at rest.              No-   productive cough,  No non-productive cough,  No-  coughing up of blood.              No-   change in color of mucus.  No- wheezing.   Skin: No-   rash or lesions. GI:  No-   heartburn, indigestion, abdominal pain, nausea, vomiting,  GU:  MS:  No-   joint pain or swelling.  + Diabetic foot ulcers Neuro- Psych:  No- change in mood or affect. No depression or anxiety.  No memory loss. Objective:   Physical Exam General- Alert, Oriented, Affect-appropriate, Distress- none acute   Very obese Skin- rash-none, lesions- none, excoriation- none Lymphadenopathy-  none Head- atraumatic            Eyes- Gross vision intact, PERRLA, conjunctivae clear secretions            Ears- Hearing, canals - normal             Nose- + pale mucosa, No- Septal dev, mucus, polyps, erosion, perforation             Throat- Mallampati IV , mucosa clear , drainage- none, tonsils- atrophic  Tongue coated Neck- flexible , trachea midline, no stridor , thyroid nl, carotid no bruit Chest - symmetrical excursion , unlabored           Heart/CV- RRR , no murmur , no gallop  , no rub, nl s1 s2                           - JVD- none , edema- 2+/ support hose, stasis changes- none, varices- none           Lung- clear to P&A, wheeze- none, cough- none , dullness-none, rub- none           Chest wall-  Abd-  Br/ Gen/ Rectal- Not done, not indicated Extrem- cyanosis- none, clubbing, none, atrophy- none, strength- nl   Walking boot right foot.  Neuro- grossly intact to observation    Assessment &  Plan:

## 2012-01-02 NOTE — Assessment & Plan Note (Signed)
Allergic rhinitis versus an early upper respiratory infection. Treat conservatively now with saline spray, adequate fluids

## 2012-01-02 NOTE — Assessment & Plan Note (Signed)
Good compliance and control with CPAP 19. He understands need to lose weight and we reemphasized responsibility to drive safely.

## 2012-01-22 ENCOUNTER — Other Ambulatory Visit: Payer: Self-pay | Admitting: Cardiology

## 2012-01-24 NOTE — Telephone Encounter (Signed)
..   Requested Prescriptions   Pending Prescriptions Disp Refills  . lisinopril (PRINIVIL,ZESTRIL) 40 MG tablet [Pharmacy Med Name: LISINOPRIL 40MG      TAB] 90 tablet 3    Sig: TAKE ONE TABLET BY MOUTH EVERY DAY

## 2012-02-10 DIAGNOSIS — Z1331 Encounter for screening for depression: Secondary | ICD-10-CM | POA: Diagnosis not present

## 2012-02-10 DIAGNOSIS — Z23 Encounter for immunization: Secondary | ICD-10-CM | POA: Diagnosis not present

## 2012-02-10 DIAGNOSIS — R05 Cough: Secondary | ICD-10-CM | POA: Diagnosis not present

## 2012-02-10 DIAGNOSIS — E1149 Type 2 diabetes mellitus with other diabetic neurological complication: Secondary | ICD-10-CM | POA: Diagnosis not present

## 2012-02-14 ENCOUNTER — Other Ambulatory Visit: Payer: Self-pay | Admitting: Cardiology

## 2012-02-15 NOTE — Telephone Encounter (Signed)
..   Requested Prescriptions   Pending Prescriptions Disp Refills  . labetalol (NORMODYNE) 200 MG tablet [Pharmacy Med Name: LABETALOL 200MG      TAB] 180 tablet 2    Sig: TAKE TWO TABLETS BY MOUTH THREE TIMES DAILY

## 2012-02-22 ENCOUNTER — Encounter (HOSPITAL_BASED_OUTPATIENT_CLINIC_OR_DEPARTMENT_OTHER): Payer: Medicare Other | Attending: General Surgery

## 2012-02-22 DIAGNOSIS — E1169 Type 2 diabetes mellitus with other specified complication: Secondary | ICD-10-CM | POA: Diagnosis not present

## 2012-02-22 DIAGNOSIS — Z951 Presence of aortocoronary bypass graft: Secondary | ICD-10-CM | POA: Insufficient documentation

## 2012-02-22 DIAGNOSIS — Z79899 Other long term (current) drug therapy: Secondary | ICD-10-CM | POA: Insufficient documentation

## 2012-02-22 DIAGNOSIS — L97509 Non-pressure chronic ulcer of other part of unspecified foot with unspecified severity: Secondary | ICD-10-CM | POA: Insufficient documentation

## 2012-02-22 DIAGNOSIS — Z7982 Long term (current) use of aspirin: Secondary | ICD-10-CM | POA: Diagnosis not present

## 2012-02-22 DIAGNOSIS — I10 Essential (primary) hypertension: Secondary | ICD-10-CM | POA: Insufficient documentation

## 2012-02-22 NOTE — Progress Notes (Signed)
[facility]  NAME:  RANE, BLITCH NO.:  [id number]  MEDICAL RECORD NO.:  [id number]      DATE OF BIRTH:  06/19/1945  PHYSICIAN:  Ardath Sax, M.D.           VISIT DATE:                                  OFFICE VISIT   Kerry Waters is a very obese 66 year old gentleman who has diabetes. He comes to [place] with a nonhealing diabetic foot ulcer on the plantar aspect of his right great toe which is graded as a Wagner 3.  He weighs 327 pounds.  His temperature is 97.8, pulse 49, respirations 18, blood pressure is 166/68.  This gentleman has a history of hypertension and diabetes.  He has also had a coronary artery bypass graft in the past and he has had cataract surgery.  He has hypertension for which he takes lisinopril and hydrochlorothiazide.  He also takes diltiazem, spironolactone, labetalol, Lantus insulin 45 units at bedtime.  He takes metformin 1000 mg twice a day, aspirin 325 a day, glucosamine 500 mg a day.  He today was examined and we saw about a 2 cm Wagner 3 ulcer on his right great toe.  We got a culture.  We ordered an x-ray.  We got him a Darco shoe to offload and we put silver nitrate dressing on.  We debrided into the subcu and fascia which is what prompted the x-ray as I am afraid there could be an osteomyelitis.  He also could be a candidate for HBO.  Hopefully, we can get all the studies done and perhaps he needs antibiotics, perhaps we can use a Dermagraft, perhaps we can use HBO.  We will see him back here in a week.     Ardath Sax, M.D.     PP/MEDQ  D:  02/22/2012  T:  02/22/2012  Job:  [id number]

## 2012-02-23 DIAGNOSIS — L98499 Non-pressure chronic ulcer of skin of other sites with unspecified severity: Secondary | ICD-10-CM | POA: Diagnosis not present

## 2012-02-24 ENCOUNTER — Other Ambulatory Visit (HOSPITAL_BASED_OUTPATIENT_CLINIC_OR_DEPARTMENT_OTHER): Payer: Self-pay | Admitting: General Surgery

## 2012-02-24 ENCOUNTER — Ambulatory Visit (HOSPITAL_COMMUNITY)
Admission: RE | Admit: 2012-02-24 | Discharge: 2012-02-24 | Disposition: A | Discharge status: Home / Self Care | Payer: Medicare Other | Source: Ambulatory Visit | Attending: General Surgery | Admitting: General Surgery

## 2012-02-24 DIAGNOSIS — M79609 Pain in unspecified limb: Secondary | ICD-10-CM | POA: Diagnosis not present

## 2012-02-24 DIAGNOSIS — L989 Disorder of the skin and subcutaneous tissue, unspecified: Secondary | ICD-10-CM | POA: Insufficient documentation

## 2012-02-24 DIAGNOSIS — M869 Osteomyelitis, unspecified: Secondary | ICD-10-CM

## 2012-02-25 ENCOUNTER — Ambulatory Visit (INDEPENDENT_AMBULATORY_CARE_PROVIDER_SITE_OTHER): Payer: Medicare Other | Admitting: *Deleted

## 2012-02-25 DIAGNOSIS — I739 Peripheral vascular disease, unspecified: Secondary | ICD-10-CM | POA: Diagnosis not present

## 2012-02-25 DIAGNOSIS — L98499 Non-pressure chronic ulcer of skin of other sites with unspecified severity: Secondary | ICD-10-CM | POA: Diagnosis not present

## 2012-02-25 DIAGNOSIS — L97909 Non-pressure chronic ulcer of unspecified part of unspecified lower leg with unspecified severity: Secondary | ICD-10-CM

## 2012-02-29 DIAGNOSIS — L97509 Non-pressure chronic ulcer of other part of unspecified foot with unspecified severity: Secondary | ICD-10-CM | POA: Diagnosis not present

## 2012-02-29 DIAGNOSIS — I10 Essential (primary) hypertension: Secondary | ICD-10-CM | POA: Diagnosis not present

## 2012-02-29 DIAGNOSIS — Z951 Presence of aortocoronary bypass graft: Secondary | ICD-10-CM | POA: Diagnosis not present

## 2012-02-29 DIAGNOSIS — E1169 Type 2 diabetes mellitus with other specified complication: Secondary | ICD-10-CM | POA: Diagnosis not present

## 2012-03-02 ENCOUNTER — Other Ambulatory Visit: Payer: Self-pay | Admitting: *Deleted

## 2012-03-02 ENCOUNTER — Other Ambulatory Visit: Payer: Self-pay | Admitting: Cardiology

## 2012-03-02 DIAGNOSIS — I739 Peripheral vascular disease, unspecified: Secondary | ICD-10-CM

## 2012-03-07 DIAGNOSIS — Z951 Presence of aortocoronary bypass graft: Secondary | ICD-10-CM | POA: Diagnosis not present

## 2012-03-07 DIAGNOSIS — I10 Essential (primary) hypertension: Secondary | ICD-10-CM | POA: Diagnosis not present

## 2012-03-07 DIAGNOSIS — E1169 Type 2 diabetes mellitus with other specified complication: Secondary | ICD-10-CM | POA: Diagnosis not present

## 2012-03-07 DIAGNOSIS — L97509 Non-pressure chronic ulcer of other part of unspecified foot with unspecified severity: Secondary | ICD-10-CM | POA: Diagnosis not present

## 2012-03-13 ENCOUNTER — Encounter: Payer: Self-pay | Admitting: Vascular Surgery

## 2012-03-14 ENCOUNTER — Other Ambulatory Visit: Payer: Self-pay | Admitting: *Deleted

## 2012-03-14 ENCOUNTER — Encounter (INDEPENDENT_AMBULATORY_CARE_PROVIDER_SITE_OTHER): Payer: Medicare Other | Admitting: *Deleted

## 2012-03-14 ENCOUNTER — Encounter: Payer: Self-pay | Admitting: Vascular Surgery

## 2012-03-14 ENCOUNTER — Ambulatory Visit (INDEPENDENT_AMBULATORY_CARE_PROVIDER_SITE_OTHER): Payer: Medicare Other | Admitting: Vascular Surgery

## 2012-03-14 VITALS — BP 118/56 | HR 47 | Ht 72.0 in | Wt 334.0 lb

## 2012-03-14 DIAGNOSIS — E1169 Type 2 diabetes mellitus with other specified complication: Secondary | ICD-10-CM | POA: Diagnosis not present

## 2012-03-14 DIAGNOSIS — M7989 Other specified soft tissue disorders: Secondary | ICD-10-CM

## 2012-03-14 DIAGNOSIS — I10 Essential (primary) hypertension: Secondary | ICD-10-CM | POA: Diagnosis not present

## 2012-03-14 DIAGNOSIS — I739 Peripheral vascular disease, unspecified: Secondary | ICD-10-CM | POA: Insufficient documentation

## 2012-03-14 DIAGNOSIS — L97929 Non-pressure chronic ulcer of unspecified part of left lower leg with unspecified severity: Secondary | ICD-10-CM

## 2012-03-14 DIAGNOSIS — L98499 Non-pressure chronic ulcer of skin of other sites with unspecified severity: Secondary | ICD-10-CM

## 2012-03-14 DIAGNOSIS — Z951 Presence of aortocoronary bypass graft: Secondary | ICD-10-CM | POA: Diagnosis not present

## 2012-03-14 DIAGNOSIS — I7025 Atherosclerosis of native arteries of other extremities with ulceration: Secondary | ICD-10-CM | POA: Insufficient documentation

## 2012-03-14 DIAGNOSIS — E1149 Type 2 diabetes mellitus with other diabetic neurological complication: Secondary | ICD-10-CM | POA: Diagnosis not present

## 2012-03-14 DIAGNOSIS — L97509 Non-pressure chronic ulcer of other part of unspecified foot with unspecified severity: Secondary | ICD-10-CM | POA: Diagnosis not present

## 2012-03-14 NOTE — Progress Notes (Signed)
Subjective:     Patient ID: Kerry Waters, male   DOB: 27-Aug-1945, 66 y.o.   MRN: [id number]  HPI this 66 year old male with type 1 diabetes mellitus has a long history of peripheral neuropathy. He has had a callus on the plantar surface of the right first toe for several years and has had an ulceration for about 1-2 years. This has been treated in the wound Center with multiple hyperbaric oxygen treatments but no healing. He has no history of rest pain or severe claudication. He also has no history of DVT, superficial thrombophlebitis, or bleeding. He has noticed the lower third of the leg bilaterally to have dark skin which is scaly since his heart surgery in 1993. The distal saphenous veins were removed on both legs at that time. He was referred for further evaluation.  Past Medical History  Diagnosis Date  . Joint pain   . Ulcer of foot   . Ischemic heart disease   . MI (myocardial infarction) 1987    ANTERIOR SEPTAL  . Hypokinesis     MILD INFERIOR  . Hypertension   . Edema of lower extremity   . Obesities, morbid   . SVT (supraventricular tachycardia)   . Hx of CABG 1993  . Abnormal cardiovascular stress test 2008    EF 48%. No clear cut ischemia. Low risk scan  . Diabetes mellitus     Dr. Timothy Lasso follows.  [name] PVD (peripheral vascular disease)   . Venous stasis   . Peripheral neuropathy   . Hyperlipidemia   . Coronary artery disease   . Sleep apnea     CPAP  . History of angina   . Cancer     basil cell carcinoma    History  Substance Use Topics  . Smoking status: Former Smoker -- 1.0 packs/day for 30 years    Types: Cigarettes    Quit date: 04/19/1985  . Smokeless tobacco: Never Used  . Alcohol Use: No    Family History  Problem Relation Age of Onset  . Heart attack Father   . Heart disease Father     before age 71  . Cancer Mother     stomach cancer  . Heart attack Maternal Grandfather   . Heart attack Paternal Grandfather   . Cancer Sister     breast  .  Diabetes Son     No Known Allergies  Current outpatient prescriptions:aspirin 325 MG tablet, Take 325 mg by mouth daily. 2 DAILY , Disp: , Rfl: ;  atorvastatin (LIPITOR) 40 MG tablet, TAKE ONE TABLET BY MOUTH AT BEDTIME, Disp: 30 tablet, Rfl: 11;  Coenzyme Q10 300 MG CAPS, Take 1 capsule by mouth daily.  , Disp: , Rfl: ;  diltiazem (CARDIZEM CD) 180 MG 24 hr capsule, TAKE ONE CAPSULE BY MOUTH TWICE DAILY, Disp: 180 capsule, Rfl: 2 Exenatide (BYETTA 5 MCG PEN [place]), Inject 10 mg into the skin 2 (two) times daily. , Disp: , Rfl: ;  glimepiride (AMARYL) 4 MG tablet, Take 1.5 tablets by mouth qd, Disp: , Rfl: ;  GLUCOSAMINE PO, Take 1 capsule by mouth daily. , Disp: , Rfl: ;  hydrochlorothiazide (HYDRODIURIL) 25 MG tablet, TAKE ONE TABLET BY MOUTH EVERY DAY, Disp: 30 tablet, Rfl: 6 labetalol (NORMODYNE) 200 MG tablet, TAKE TWO TABLETS BY MOUTH THREE TIMES DAILY, Disp: 180 tablet, Rfl: 2;  LEVEMIR FLEXPEN 100 UNIT/ML injection, Inject 45 Units as directed daily., Disp: , Rfl: ;  lisinopril (PRINIVIL,ZESTRIL) 40 MG tablet, Take 40  mg by mouth daily. , Disp: , Rfl: ;  lisinopril (PRINIVIL,ZESTRIL) 40 MG tablet, TAKE ONE TABLET BY MOUTH EVERY DAY, Disp: 90 tablet, Rfl: 3 metFORMIN (GLUCOPHAGE) 1000 MG tablet, Take 1,000 mg by mouth 2 (two) times daily with a meal.  , Disp: , Rfl: ;  Multiple Vitamin (MULTIVITAMIN) tablet, Take 1 tablet by mouth daily.  , Disp: , Rfl: ;  spironolactone (ALDACTONE) 25 MG tablet, Take 1 tablet (25 mg total) by mouth daily., Disp: 90 tablet, Rfl: 3;  sulfamethoxazole-trimethoprim (BACTRIM DS) 800-160 MG per tablet, Take 1 tablet by mouth 2 (two) times daily., Disp: , Rfl:   BP 118/56  Pulse 47  Ht 6' (1.829 m)  Wt 334 lb (151.501 kg)  BMI 45.30 kg/m2  SpO2 94%  Body mass index is 45.30 kg/(m^2).         Review of Systems denies chest pain but does have dyspnea on exertion, lower extremity edema, leg discomfort while lying flat, a history of an ulceration in the left leg  laterally which healed spontaneously. As lateralizing weakness, aphasia, amaurosis fugax, syncope. Other systems negative    Objective:   Physical Exam blood pressure 118/56 heart rate 47 respirations 16 Gen.-alert and oriented x3 in no apparent distress-morbidly obese  HEENT normal for age Lungs no rhonchi or wheezing Cardiovascular regular rhythm no murmurs carotid pulses 3+ palpable no bruits audible Abdomen soft nontender no palpable masses-obese  Musculoskeletal free of  major deformities Skin clear -no rashes Neurologic normal Lower extremities 3+ femoral and 2+ posterior tibial pulses palpable bilaterally. Both feet are pink warm and well perfused. The right first toe has a 1 cm ulceration on the plantar surface overlying a callus. There is severe hyperpigmentation and a scaly type rash over the lower third of both lower extremities with 1-2+ edema. No large bulging varicosities are noted.  Today I ordered lower extremity arterial Doppler study which revealed an ABI of 0.80 on the right and 0.91 on the left with brisk biphasic flow. There is no evidence of significant arterial stenosis in the right lower extremity.       Assessment:     Ulceration right first toe secondary to peripheral neuropathy with ulceration overlying callus Probable severe venous insufficiency due to either superficial or deep venous reflux    Plan:     Patient will return in the next few weeks for thorough bilateral venous duplex exam to look for reflux and superficial or deep system to see if laser ablation of great saphenous veins would be beneficial because of severe skin changes. I would refer patient to Dr. Aldean Baker for evaluation of ulcerated callus on right first toe-no indication for revascularization

## 2012-03-15 NOTE — Addendum Note (Signed)
Addended by: Sharee Pimple on: 03/15/2012 01:16 PM   Modules accepted: Orders

## 2012-03-21 ENCOUNTER — Encounter (HOSPITAL_BASED_OUTPATIENT_CLINIC_OR_DEPARTMENT_OTHER): Payer: Medicare Other | Attending: General Surgery

## 2012-03-21 ENCOUNTER — Encounter: Payer: Medicare Other | Admitting: Vascular Surgery

## 2012-03-21 ENCOUNTER — Encounter (HOSPITAL_BASED_OUTPATIENT_CLINIC_OR_DEPARTMENT_OTHER): Payer: Medicare Other

## 2012-03-21 DIAGNOSIS — L97509 Non-pressure chronic ulcer of other part of unspecified foot with unspecified severity: Secondary | ICD-10-CM | POA: Insufficient documentation

## 2012-03-21 DIAGNOSIS — E1169 Type 2 diabetes mellitus with other specified complication: Secondary | ICD-10-CM | POA: Insufficient documentation

## 2012-03-21 DIAGNOSIS — L84 Corns and callosities: Secondary | ICD-10-CM | POA: Diagnosis not present

## 2012-03-28 DIAGNOSIS — E1169 Type 2 diabetes mellitus with other specified complication: Secondary | ICD-10-CM | POA: Diagnosis not present

## 2012-03-28 DIAGNOSIS — L84 Corns and callosities: Secondary | ICD-10-CM | POA: Diagnosis not present

## 2012-03-28 DIAGNOSIS — L97509 Non-pressure chronic ulcer of other part of unspecified foot with unspecified severity: Secondary | ICD-10-CM | POA: Diagnosis not present

## 2012-04-03 ENCOUNTER — Encounter: Payer: Self-pay | Admitting: Vascular Surgery

## 2012-04-04 ENCOUNTER — Encounter: Payer: Self-pay | Admitting: Vascular Surgery

## 2012-04-04 ENCOUNTER — Encounter (INDEPENDENT_AMBULATORY_CARE_PROVIDER_SITE_OTHER): Payer: Medicare Other | Admitting: *Deleted

## 2012-04-04 ENCOUNTER — Ambulatory Visit (INDEPENDENT_AMBULATORY_CARE_PROVIDER_SITE_OTHER): Payer: Medicare Other | Admitting: Vascular Surgery

## 2012-04-04 VITALS — BP 145/75 | HR 68 | Resp 18 | Ht 71.0 in | Wt 320.0 lb

## 2012-04-04 DIAGNOSIS — I83893 Varicose veins of bilateral lower extremities with other complications: Secondary | ICD-10-CM

## 2012-04-04 DIAGNOSIS — E1169 Type 2 diabetes mellitus with other specified complication: Secondary | ICD-10-CM | POA: Diagnosis not present

## 2012-04-04 DIAGNOSIS — I739 Peripheral vascular disease, unspecified: Secondary | ICD-10-CM

## 2012-04-04 DIAGNOSIS — L84 Corns and callosities: Secondary | ICD-10-CM | POA: Diagnosis not present

## 2012-04-04 DIAGNOSIS — M7989 Other specified soft tissue disorders: Secondary | ICD-10-CM

## 2012-04-04 DIAGNOSIS — L97509 Non-pressure chronic ulcer of other part of unspecified foot with unspecified severity: Secondary | ICD-10-CM | POA: Diagnosis not present

## 2012-04-04 DIAGNOSIS — I872 Venous insufficiency (chronic) (peripheral): Secondary | ICD-10-CM | POA: Diagnosis not present

## 2012-04-04 NOTE — Progress Notes (Signed)
Subjective:     Patient ID: Kerry Waters, male   DOB: 07/25/1945, 66 y.o.   MRN: [id number]  HPI this 66 year old male returns today for further followup regarding his severe skin changes bilaterally due to chronic venous insufficiency. He was referred for a callus with ulceration of the right first toe which has been treated in one Center for the past 2 years. That continues to slowly heal but is unrelated to his venous disease. He was found to have no severe arterial occlusive disease at last visit with ABI of 0.91 on the left and 0.80 on the right. He has biphasic flow distally with palpable posterior tibial pulses. Skin changes in his lower extremities have been progressing over the past few years since he had coronary artery bypass grafting when distal great saphenous vein was removed bilaterally. He has no history of stasis ulcers or bleeding.   Review of Systems     Objective:   Physical ExamBP 145/75  Pulse 68  Resp 18  Ht 5\' 11"  (1.803 m)  Wt 320 lb (145.151 kg)  BMI 44.63 kg/m2  General well-developed well-nourished male in no apparent distress alert and oriented x3 Lungs no rhonchi or wheezing Abdomen obese no palpable masses Both lower extremities with severe hyperpigmentation involving the lower half of both lower legs with no active ulcers. No bulging varicosities are noted. He has chronic 1-2+ edema bilaterally. 3+ posterior tibial pulses are palpable.  Today I ordered bilateral venous duplex exam which I reviewed and interpreted. He has gross reflux in both great saphenous systems throughout and also has some reflux in the right small saphenous vein.    Assessment:     Severe bilateral venous insufficiency with severe hyperpigmentation and chronic edema and pain Documented gross reflux both great saphenous systems    Plan:     #1 long-leg elastic compression stockings 20-30 mm gradient #2 elevation daily #3 ibuprofen on a daily basis #4 return in 3 months-if no  significant improvement in color and quality of skin and edema he needs staged bilateral laser ablation of great saphenous veins.

## 2012-04-18 DIAGNOSIS — E1169 Type 2 diabetes mellitus with other specified complication: Secondary | ICD-10-CM | POA: Diagnosis not present

## 2012-04-18 DIAGNOSIS — L84 Corns and callosities: Secondary | ICD-10-CM | POA: Diagnosis not present

## 2012-04-18 DIAGNOSIS — L97509 Non-pressure chronic ulcer of other part of unspecified foot with unspecified severity: Secondary | ICD-10-CM | POA: Diagnosis not present

## 2012-04-25 ENCOUNTER — Encounter (HOSPITAL_BASED_OUTPATIENT_CLINIC_OR_DEPARTMENT_OTHER): Payer: Medicare Other | Attending: General Surgery

## 2012-04-25 DIAGNOSIS — L84 Corns and callosities: Secondary | ICD-10-CM | POA: Diagnosis not present

## 2012-04-25 DIAGNOSIS — L97509 Non-pressure chronic ulcer of other part of unspecified foot with unspecified severity: Secondary | ICD-10-CM | POA: Diagnosis not present

## 2012-04-25 DIAGNOSIS — E1169 Type 2 diabetes mellitus with other specified complication: Secondary | ICD-10-CM | POA: Diagnosis not present

## 2012-04-27 DIAGNOSIS — H53469 Homonymous bilateral field defects, unspecified side: Secondary | ICD-10-CM | POA: Diagnosis not present

## 2012-04-28 DIAGNOSIS — H53469 Homonymous bilateral field defects, unspecified side: Secondary | ICD-10-CM | POA: Diagnosis not present

## 2012-04-28 DIAGNOSIS — I6330 Cerebral infarction due to thrombosis of unspecified cerebral artery: Secondary | ICD-10-CM | POA: Diagnosis not present

## 2012-04-28 DIAGNOSIS — Z79899 Other long term (current) drug therapy: Secondary | ICD-10-CM | POA: Diagnosis not present

## 2012-04-28 DIAGNOSIS — I6789 Other cerebrovascular disease: Secondary | ICD-10-CM | POA: Diagnosis not present

## 2012-05-02 ENCOUNTER — Other Ambulatory Visit: Payer: Self-pay | Admitting: Internal Medicine

## 2012-05-02 ENCOUNTER — Other Ambulatory Visit (HOSPITAL_BASED_OUTPATIENT_CLINIC_OR_DEPARTMENT_OTHER): Payer: Self-pay | Admitting: General Surgery

## 2012-05-02 DIAGNOSIS — M869 Osteomyelitis, unspecified: Secondary | ICD-10-CM

## 2012-05-02 DIAGNOSIS — L97509 Non-pressure chronic ulcer of other part of unspecified foot with unspecified severity: Secondary | ICD-10-CM | POA: Diagnosis not present

## 2012-05-02 DIAGNOSIS — E1169 Type 2 diabetes mellitus with other specified complication: Secondary | ICD-10-CM | POA: Diagnosis not present

## 2012-05-02 DIAGNOSIS — L84 Corns and callosities: Secondary | ICD-10-CM | POA: Diagnosis not present

## 2012-05-04 DIAGNOSIS — I6529 Occlusion and stenosis of unspecified carotid artery: Secondary | ICD-10-CM | POA: Diagnosis not present

## 2012-05-04 DIAGNOSIS — I658 Occlusion and stenosis of other precerebral arteries: Secondary | ICD-10-CM | POA: Diagnosis not present

## 2012-05-04 DIAGNOSIS — I6350 Cerebral infarction due to unspecified occlusion or stenosis of unspecified cerebral artery: Secondary | ICD-10-CM | POA: Diagnosis not present

## 2012-05-04 DIAGNOSIS — I059 Rheumatic mitral valve disease, unspecified: Secondary | ICD-10-CM | POA: Diagnosis not present

## 2012-05-04 DIAGNOSIS — I6789 Other cerebrovascular disease: Secondary | ICD-10-CM | POA: Diagnosis not present

## 2012-05-05 ENCOUNTER — Ambulatory Visit (HOSPITAL_COMMUNITY)
Admission: RE | Admit: 2012-05-05 | Discharge: 2012-05-05 | Disposition: A | Discharge status: Home / Self Care | Payer: Medicare Other | Source: Ambulatory Visit | Attending: General Surgery | Admitting: General Surgery

## 2012-05-05 DIAGNOSIS — S99929A Unspecified injury of unspecified foot, initial encounter: Secondary | ICD-10-CM | POA: Insufficient documentation

## 2012-05-05 DIAGNOSIS — M899 Disorder of bone, unspecified: Secondary | ICD-10-CM | POA: Diagnosis not present

## 2012-05-05 DIAGNOSIS — R609 Edema, unspecified: Secondary | ICD-10-CM | POA: Diagnosis not present

## 2012-05-05 DIAGNOSIS — M7989 Other specified soft tissue disorders: Secondary | ICD-10-CM | POA: Diagnosis not present

## 2012-05-05 DIAGNOSIS — X58XXXA Exposure to other specified factors, initial encounter: Secondary | ICD-10-CM | POA: Insufficient documentation

## 2012-05-05 DIAGNOSIS — S8990XA Unspecified injury of unspecified lower leg, initial encounter: Secondary | ICD-10-CM | POA: Diagnosis not present

## 2012-05-05 DIAGNOSIS — M869 Osteomyelitis, unspecified: Secondary | ICD-10-CM

## 2012-05-05 LAB — CREATININE, SERUM
Creatinine, Ser: 1.38 mg/dL — ABNORMAL HIGH (ref 0.50–1.35)
GFR calc non Af Amer: 51 mL/min — ABNORMAL LOW (ref >90)

## 2012-05-05 MED ORDER — GADOBENATE DIMEGLUMINE 529 MG/ML IV SOLN
20.0000 mL | Freq: Once | INTRAVENOUS | Status: AC | PRN
  Administered 2012-05-05: 20 mL via INTRAVENOUS

## 2012-05-08 ENCOUNTER — Encounter: Payer: Self-pay | Admitting: Vascular Surgery

## 2012-05-09 ENCOUNTER — Encounter: Payer: Self-pay | Admitting: Vascular Surgery

## 2012-05-09 ENCOUNTER — Ambulatory Visit (INDEPENDENT_AMBULATORY_CARE_PROVIDER_SITE_OTHER): Payer: Medicare Other | Admitting: Vascular Surgery

## 2012-05-09 VITALS — BP 108/52 | HR 63 | Ht 71.0 in | Wt 326.2 lb

## 2012-05-09 DIAGNOSIS — L97509 Non-pressure chronic ulcer of other part of unspecified foot with unspecified severity: Secondary | ICD-10-CM | POA: Diagnosis not present

## 2012-05-09 DIAGNOSIS — E1169 Type 2 diabetes mellitus with other specified complication: Secondary | ICD-10-CM | POA: Diagnosis not present

## 2012-05-09 DIAGNOSIS — L97919 Non-pressure chronic ulcer of unspecified part of right lower leg with unspecified severity: Secondary | ICD-10-CM | POA: Insufficient documentation

## 2012-05-09 DIAGNOSIS — L84 Corns and callosities: Secondary | ICD-10-CM | POA: Diagnosis not present

## 2012-05-09 DIAGNOSIS — I83893 Varicose veins of bilateral lower extremities with other complications: Secondary | ICD-10-CM

## 2012-05-09 DIAGNOSIS — I872 Venous insufficiency (chronic) (peripheral): Secondary | ICD-10-CM

## 2012-05-09 DIAGNOSIS — L97909 Non-pressure chronic ulcer of unspecified part of unspecified lower leg with unspecified severity: Secondary | ICD-10-CM

## 2012-05-09 NOTE — Progress Notes (Signed)
Subjective:     Patient ID: Kerry Waters, male   DOB: 1945-09-17, 67 y.o.   MRN: [id number]  HPI this 67 year old male with diabetes mellitus has been seen by me twice in the past 6 weeks. He has a nonhealing ulcer the right first toe which has been treated in the wound Center for several months. He has had a callus on the toe for many years he states. He does have insulin-dependent diabetes mellitus and peripheral neuropathy. He also was found to have severe bilateral chronic venous insufficiency with reflux in both great saphenous veins by duplex scan December of 2013. Distal saphenous veins have been removed many years ago for coronary artery bypass grafting. He also has been known to have excellent ABIs in both lower extremities with palpable pulses.  Past Medical History  Diagnosis Date  . Joint pain   . Ulcer of foot   . Ischemic heart disease   . MI (myocardial infarction) 1987    ANTERIOR SEPTAL  . Hypokinesis     MILD INFERIOR  . Hypertension   . Edema of lower extremity   . Obesities, morbid   . SVT (supraventricular tachycardia)   . Hx of CABG 1993  . Abnormal cardiovascular stress test 2008    EF 48%. No clear cut ischemia. Low risk scan  . Diabetes mellitus     Dr. Timothy Lasso follows.  [name] PVD (peripheral vascular disease)   . Venous stasis   . Peripheral neuropathy   . Hyperlipidemia   . Coronary artery disease   . Sleep apnea     CPAP  . History of angina   . Cancer     basil cell carcinoma  . Stroke     pt states possible stroke on 04/25/2012    History  Substance Use Topics  . Smoking status: Former Smoker -- 1.0 packs/day for 30 years    Types: Cigarettes    Quit date: 04/19/1985  . Smokeless tobacco: Never Used  . Alcohol Use: No    Family History  Problem Relation Age of Onset  . Heart attack Father   . Heart disease Father     before age 70  . Cancer Mother     stomach cancer  . Heart attack Maternal Grandfather   . Heart attack Paternal  Grandfather   . Cancer Sister     breast  . Diabetes Son     No Known Allergies  Current outpatient prescriptions:aspirin 325 MG tablet, Take 325 mg by mouth daily. 2 DAILY , Disp: , Rfl: ;  atorvastatin (LIPITOR) 40 MG tablet, TAKE ONE TABLET BY MOUTH AT BEDTIME, Disp: 30 tablet, Rfl: 11;  Canagliflozin (INVOKANA) 100 MG TABS, Take by mouth., Disp: , Rfl: ;  clopidogrel (PLAVIX) 75 MG tablet, Take 1 tablet by mouth daily., Disp: , Rfl: ;  Coenzyme Q10 300 MG CAPS, Take 1 capsule by mouth daily.  , Disp: , Rfl:  diltiazem (CARDIZEM CD) 180 MG 24 hr capsule, TAKE ONE CAPSULE BY MOUTH TWICE DAILY, Disp: 180 capsule, Rfl: 2;  Exenatide (BYETTA 5 MCG PEN ), Inject 10 mg into the skin 2 (two) times daily. , Disp: , Rfl: ;  glimepiride (AMARYL) 4 MG tablet, Take 1.5 tablets by mouth qd, Disp: , Rfl: ;  GLUCOSAMINE PO, Take 1 capsule by mouth daily. , Disp: , Rfl:  hydrochlorothiazide (HYDRODIURIL) 25 MG tablet, TAKE ONE TABLET BY MOUTH EVERY DAY, Disp: 30 tablet, Rfl: 6;  labetalol (NORMODYNE) 200 MG tablet, TAKE  TWO TABLETS BY MOUTH THREE TIMES DAILY, Disp: 180 tablet, Rfl: 2;  LEVEMIR FLEXPEN 100 UNIT/ML injection, Inject 45 Units as directed daily., Disp: , Rfl: ;  lisinopril (PRINIVIL,ZESTRIL) 40 MG tablet, TAKE ONE TABLET BY MOUTH EVERY DAY, Disp: 90 tablet, Rfl: 3 metFORMIN (GLUCOPHAGE) 1000 MG tablet, Take 1,000 mg by mouth 2 (two) times daily with a meal.  , Disp: , Rfl: ;  Multiple Vitamin (MULTIVITAMIN) tablet, Take 1 tablet by mouth daily.  , Disp: , Rfl: ;  spironolactone (ALDACTONE) 25 MG tablet, Take 1 tablet (25 mg total) by mouth daily., Disp: 90 tablet, Rfl: 3;  sulfamethoxazole-trimethoprim (BACTRIM DS) 800-160 MG per tablet, Take 1 tablet by mouth 2 (two) times daily., Disp: , Rfl:   BP 108/52  Pulse 63  Ht 5\' 11"  (1.803 m)  Wt 326 lb 3.2 oz (147.963 kg)  BMI 45.50 kg/m2  SpO2 96%  Body mass index is 45.50 kg/(m^2).          Review of Systems denies chest pain, dyspnea on  exertion, PND, orthopnea. Does have chronic numbness in both lower extremities from his diabetes mellitus. Denies hemoptysis.     Objective:   Physical Exam blood pressure 108/52 heart rate 63 respirations 18 General well-developed well-nourished obese male in no apparent stress alert and oriented x3 Lungs no rhonchi or wheezing Right lower extremity with 3+ femoral and posterior tibial pulse palpable. Cleaned punctate ulcer on the plantar aspect right first toe with no cellulitis. Chronic venous insufficiency with severe lipo- dermatosclerosis lower third of the right leg with scaly rash-identical findings left lower extremity without ulceration on left first toe and 3+ posterior tibial pulse palpable    Today I reviewed the duplex scan performed in December which reveals gross reflux in both great saphenous vein with some reflux in the deep system in the common femoral veins. 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 He will continue this for 2 more months and then return for further evaluation. If no significant improvement in his skin then we should proceed with staged bilateral great saphenous vein laser ablation procedures beginning with right side. Do not feel that his ulcer on the toe is primarily from venous insufficiency but this may assist in healing and because of severe skin changes venous procedure does need to be performed bilaterally

## 2012-05-10 NOTE — Addendum Note (Signed)
Addended by: Dannielle Karvonen on: 05/10/2012 03:16 PM   Modules accepted: Orders

## 2012-05-12 ENCOUNTER — Ambulatory Visit (INDEPENDENT_AMBULATORY_CARE_PROVIDER_SITE_OTHER): Payer: Medicare Other | Admitting: Cardiology

## 2012-05-12 ENCOUNTER — Encounter: Payer: Self-pay | Admitting: Cardiology

## 2012-05-12 ENCOUNTER — Ambulatory Visit (INDEPENDENT_AMBULATORY_CARE_PROVIDER_SITE_OTHER): Payer: Medicare Other

## 2012-05-12 VITALS — BP 116/64 | HR 42 | Ht 71.0 in | Wt 328.0 lb

## 2012-05-12 DIAGNOSIS — I498 Other specified cardiac arrhythmias: Secondary | ICD-10-CM | POA: Diagnosis not present

## 2012-05-12 DIAGNOSIS — I4891 Unspecified atrial fibrillation: Secondary | ICD-10-CM

## 2012-05-12 DIAGNOSIS — I219 Acute myocardial infarction, unspecified: Secondary | ICD-10-CM

## 2012-05-12 DIAGNOSIS — I2510 Atherosclerotic heart disease of native coronary artery without angina pectoris: Secondary | ICD-10-CM

## 2012-05-12 DIAGNOSIS — I259 Chronic ischemic heart disease, unspecified: Secondary | ICD-10-CM | POA: Diagnosis not present

## 2012-05-12 DIAGNOSIS — I471 Supraventricular tachycardia: Secondary | ICD-10-CM

## 2012-05-12 DIAGNOSIS — I83893 Varicose veins of bilateral lower extremities with other complications: Secondary | ICD-10-CM

## 2012-05-12 DIAGNOSIS — I10 Essential (primary) hypertension: Secondary | ICD-10-CM

## 2012-05-12 NOTE — Addendum Note (Signed)
Addended by: Yolonda Kida on: 05/12/2012 03:03 PM   Modules accepted: Orders

## 2012-05-12 NOTE — Patient Instructions (Addendum)
The current medical regimen is effective;  continue present plan and medications.  Your physician has recommended that you wear a holter monitor. Holter monitors are medical devices that record the heart's electrical activity. Doctors most often use these monitors to diagnose arrhythmias. Arrhythmias are problems with the speed or rhythm of the heartbeat. The monitor is a small, portable device. You can wear one while you do your normal daily activities. This is usually used to diagnose what is causing palpitations/syncope (passing out).  Follow up will after testing in 2 weeks.

## 2012-05-12 NOTE — Progress Notes (Signed)
Placed a 24 hrs holter monitor on patient and went over instructions on how to use it and when to return it

## 2012-05-12 NOTE — Progress Notes (Signed)
HPI The patient presents as a follow up after recent episode of possible TIA. He had a transient loss of vision. He apparently saw his eye doctor and was not found to have any obvious etiology. He was referred to a neurologist in another community. He reports having had an MRI, carotid Dopplers and an echo. He has not heard these results and I do not have them. When he saw Dr. Timothy Lasso recently his EKG was irregular but I reviewed this and it was not atrial fibrillation. Today he is in a bradycardic rhythm but it appears to be a junctional bradycardia. He has not had any presyncope or syncope. He has not had any chest pressure, neck or arm discomfort. He's not noticing any new shortness of breath, PND or orthopnea. He's not had weight gain or edema.  No Known Allergies  Current Outpatient Prescriptions  Medication Sig Dispense Refill  . aspirin 325 MG tablet Take 325 mg by mouth daily. 2 DAILY       . atorvastatin (LIPITOR) 40 MG tablet TAKE ONE TABLET BY MOUTH AT BEDTIME  30 tablet  11  . Canagliflozin (INVOKANA) 100 MG TABS Take by mouth.      . clopidogrel (PLAVIX) 75 MG tablet Take 1 tablet by mouth daily.      . Coenzyme Q10 300 MG CAPS Take 1 capsule by mouth daily.        [name] diltiazem (CARDIZEM CD) 180 MG 24 hr capsule TAKE ONE CAPSULE BY MOUTH TWICE DAILY  180 capsule  2  . Exenatide (BYETTA 5 MCG PEN [place]) Inject 10 mg into the skin 2 (two) times daily.       [name] glimepiride (AMARYL) 4 MG tablet Take 1.5 tablets by mouth qd      . GLUCOSAMINE PO Take 1 capsule by mouth daily.       . hydrochlorothiazide (HYDRODIURIL) 25 MG tablet TAKE ONE TABLET BY MOUTH EVERY DAY  30 tablet  6  . labetalol (NORMODYNE) 200 MG tablet TAKE TWO TABLETS BY MOUTH THREE TIMES DAILY  180 tablet  2  . LEVEMIR FLEXPEN 100 UNIT/ML injection Inject 45 Units as directed daily.      [name] lisinopril (PRINIVIL,ZESTRIL) 40 MG tablet TAKE ONE TABLET BY MOUTH EVERY DAY  90 tablet  3  . metFORMIN (GLUCOPHAGE) 1000 MG tablet Take  1,000 mg by mouth 2 (two) times daily with a meal.        . Multiple Vitamin (MULTIVITAMIN) tablet Take 1 tablet by mouth daily.        [name] spironolactone (ALDACTONE) 25 MG tablet Take 1 tablet (25 mg total) by mouth daily.  90 tablet  3  . sulfamethoxazole-trimethoprim (BACTRIM DS) 800-160 MG per tablet Take 1 tablet by mouth 2 (two) times daily.        Past Medical History  Diagnosis Date  . Joint pain   . Ulcer of foot   . Ischemic heart disease   . MI (myocardial infarction) 1987    ANTERIOR SEPTAL  . Hypokinesis     MILD INFERIOR  . Hypertension   . Edema of lower extremity   . Obesities, morbid   . SVT (supraventricular tachycardia)   . Hx of CABG 1993  . Abnormal cardiovascular stress test 2008    EF 48%. No clear cut ischemia. Low risk scan  . Diabetes mellitus     Dr. Timothy Lasso follows.  [name] PVD (peripheral vascular disease)   . Venous stasis   . Peripheral  neuropathy   . Hyperlipidemia   . Coronary artery disease   . Sleep apnea     CPAP  . History of angina   . Cancer     basil cell carcinoma  . Stroke     pt states possible stroke on 04/25/2012    Past Surgical History  Procedure Date  . Coronary artery bypass graft 1993    LIMA to LAD with patch angioplasty, SVG to OM, SVG to OM & PD and patch angioplasy to PDA  . Left ankle orif 2000    ROS:  As stated in the HPI and negative for all other systems.  PHYSICAL EXAM BP 116/64  Pulse 42  Ht 5\' 11"  (1.803 m)  Wt 328 lb (148.78 kg)  BMI 45.75 kg/m2 GENERAL:  Well appearing HEENT:  Pupils equal round and reactive, fundi not visualized, oral mucosa unremarkable NECK:  No jugular venous distention, waveform within normal limits, carotid upstroke brisk and symmetric, questionable left bruits, no thyromegaly LYMPHATICS:  No cervical, inguinal adenopathy LUNGS:  Clear to auscultation bilaterally BACK:  No CVA tenderness CHEST:  Well healed sternotomy scar. HEART:  PMI not displaced or sustained,S1 and S2 within  normal limits, no S3, no S4, no clicks, no rubs, apical systolic murmur radiating out the outflow tract ABD:  Flat, positive bowel sounds normal in frequency in pitch, no bruits, no rebound, no guarding, no midline pulsatile mass, no hepatomegaly, no splenomegaly, morbidly obese EXT:  2 plus pulses upper, two plus PT bilateral, no edema, no cyanosis no clubbing, bandaged right great toe SKIN:  No rashes no nodules NEURO:  Cranial nerves II through XII grossly intact, motor grossly intact throughout PSYCH:  Cognitively intact, oriented to person place and time  ASSESSMENT AND PLAN  TIA  I would like to review the available studies from [facility].  I did review previous EKGs done by Dr. Timothy Lasso and I do not see atrial fibrillation. Today he appears to have a junctional rhythm with some premature ectopic complexes. This is not clearly atrial fibrillation. However, I would like to review a 24-hour Holter monitor to further evaluate this as we will need to make careful decisions about the need or warfarin or one of the newer agents versus his current therapy.  CORONARY HEART DISEASE -  The patient has no new sypmtoms. No further cardiovascular testing is indicated. We will continue with aggressive risk reduction and meds as listed.   HYPERTENSION -  The blood pressure is at target. No change in medications is indicated. We will continue with therapeutic lifestyle changes (TLC).  Obesities, morbid -  .The patient understands the need to lose weight with diet and exercise. We have discussed specific strategies for this.

## 2012-05-16 ENCOUNTER — Telehealth: Payer: Self-pay | Admitting: Cardiology

## 2012-05-16 DIAGNOSIS — L97509 Non-pressure chronic ulcer of other part of unspecified foot with unspecified severity: Secondary | ICD-10-CM | POA: Diagnosis not present

## 2012-05-16 DIAGNOSIS — L84 Corns and callosities: Secondary | ICD-10-CM | POA: Diagnosis not present

## 2012-05-16 DIAGNOSIS — E1169 Type 2 diabetes mellitus with other specified complication: Secondary | ICD-10-CM | POA: Diagnosis not present

## 2012-05-16 NOTE — Telephone Encounter (Signed)
ROI faxed to [facility] @ [phone]  05/16/12/kdm

## 2012-05-18 DIAGNOSIS — I6789 Other cerebrovascular disease: Secondary | ICD-10-CM | POA: Diagnosis not present

## 2012-05-18 DIAGNOSIS — H53469 Homonymous bilateral field defects, unspecified side: Secondary | ICD-10-CM | POA: Diagnosis not present

## 2012-05-18 DIAGNOSIS — I6330 Cerebral infarction due to thrombosis of unspecified cerebral artery: Secondary | ICD-10-CM | POA: Diagnosis not present

## 2012-05-23 ENCOUNTER — Encounter (HOSPITAL_BASED_OUTPATIENT_CLINIC_OR_DEPARTMENT_OTHER): Payer: Medicare Other | Attending: General Surgery

## 2012-05-23 DIAGNOSIS — L84 Corns and callosities: Secondary | ICD-10-CM | POA: Insufficient documentation

## 2012-05-23 DIAGNOSIS — E1169 Type 2 diabetes mellitus with other specified complication: Secondary | ICD-10-CM | POA: Diagnosis not present

## 2012-05-23 DIAGNOSIS — I6330 Cerebral infarction due to thrombosis of unspecified cerebral artery: Secondary | ICD-10-CM | POA: Diagnosis not present

## 2012-05-23 DIAGNOSIS — L97509 Non-pressure chronic ulcer of other part of unspecified foot with unspecified severity: Secondary | ICD-10-CM | POA: Insufficient documentation

## 2012-05-23 DIAGNOSIS — H53469 Homonymous bilateral field defects, unspecified side: Secondary | ICD-10-CM | POA: Diagnosis not present

## 2012-05-23 DIAGNOSIS — I6789 Other cerebrovascular disease: Secondary | ICD-10-CM | POA: Diagnosis not present

## 2012-05-25 ENCOUNTER — Encounter: Payer: Self-pay | Admitting: Cardiology

## 2012-05-26 ENCOUNTER — Telehealth: Payer: Self-pay | Admitting: Cardiology

## 2012-05-26 ENCOUNTER — Encounter: Payer: Self-pay | Admitting: Cardiology

## 2012-05-26 ENCOUNTER — Ambulatory Visit (INDEPENDENT_AMBULATORY_CARE_PROVIDER_SITE_OTHER): Payer: Medicare Other | Admitting: Cardiology

## 2012-05-26 VITALS — BP 112/62 | HR 43 | Ht 71.0 in | Wt 325.6 lb

## 2012-05-26 DIAGNOSIS — I495 Sick sinus syndrome: Secondary | ICD-10-CM | POA: Diagnosis not present

## 2012-05-26 NOTE — Telephone Encounter (Signed)
Release Refaxed to [employer] 05/26/12/KM

## 2012-05-26 NOTE — Progress Notes (Signed)
HPI The patient presents as a follow up after recent episode of CVA.  This has been confirmed on MRI with an occipital stroke and some hemorrhagic conversion. He is being followed and managed by neurology. Other studies are listed below. He says he does have some residual visual defect and may be mild weakness.. When he saw Dr. Timothy Lasso recently his EKG was irregular but I reviewed this and it was not atrial fibrillation. To further evaluate this I had him wear a Holter in returns today to review this. This did demonstrate some junctional bradycardia. He had some nonsustained causes. He had some sinus bradycardia. There was occasional ventricular ectopy. He also had supraventricular tachycardia none of which was particularly sustained. He has not had any presyncope or syncope. He has not had any chest pressure, neck or arm discomfort. He's not noticing any new shortness of breath, PND or orthopnea. He's not had weight gain or edema.  No Known Allergies  Current Outpatient Prescriptions  Medication Sig Dispense Refill  . aspirin 325 MG tablet Take 325 mg by mouth daily.       [name] atorvastatin (LIPITOR) 40 MG tablet TAKE ONE TABLET BY MOUTH AT BEDTIME  30 tablet  11  . Canagliflozin (INVOKANA) 100 MG TABS Take by mouth.      . Coenzyme Q10 300 MG CAPS Take 1 capsule by mouth daily.        [name] diltiazem (CARDIZEM CD) 180 MG 24 hr capsule TAKE ONE CAPSULE BY MOUTH TWICE DAILY  180 capsule  2  . Exenatide (BYETTA 5 MCG PEN [place]) Inject 10 mg into the skin 2 (two) times daily.       [name] glimepiride (AMARYL) 4 MG tablet Take 1.5 tablets by mouth qd      . GLUCOSAMINE PO Take 1 capsule by mouth daily.       . hydrochlorothiazide (HYDRODIURIL) 25 MG tablet TAKE ONE TABLET BY MOUTH EVERY DAY  30 tablet  6  . labetalol (NORMODYNE) 200 MG tablet       . LEVEMIR FLEXPEN 100 UNIT/ML injection Inject 45 Units as directed daily.      [name] lisinopril (PRINIVIL,ZESTRIL) 40 MG tablet TAKE ONE TABLET BY MOUTH EVERY DAY  90  tablet  3  . metFORMIN (GLUCOPHAGE) 1000 MG tablet Take 1,000 mg by mouth 2 (two) times daily with a meal.        . Multiple Vitamin (MULTIVITAMIN) tablet Take 1 tablet by mouth daily.        [name] spironolactone (ALDACTONE) 25 MG tablet Take 1 tablet (25 mg total) by mouth daily.  90 tablet  3    Past Medical History  Diagnosis Date  . Joint pain   . Ulcer of foot   . Ischemic heart disease   . MI (myocardial infarction) 1987    ANTERIOR SEPTAL  . Hypokinesis     MILD INFERIOR  . Hypertension   . Edema of lower extremity   . Obesities, morbid   . SVT (supraventricular tachycardia)   . Hx of CABG 1993  . Abnormal cardiovascular stress test 2008    EF 48%. No clear cut ischemia. Low risk scan  . Diabetes mellitus     Dr. Timothy Lasso follows.  [name] PVD (peripheral vascular disease)   . Venous stasis   . Peripheral neuropathy   . Hyperlipidemia   . Coronary artery disease   . Sleep apnea     CPAP  . History of angina   .  Cancer     basil cell carcinoma  . Stroke     pt states possible stroke on 04/25/2012    Past Surgical History  Procedure Date  . Coronary artery bypass graft 1993    LIMA to LAD with patch angioplasty, SVG to OM, SVG to OM & PD and patch angioplasy to PDA  . Left ankle orif 2000    ROS:  As stated in the HPI and negative for all other systems.  PHYSICAL EXAM BP 112/62  Pulse 43  Ht 5\' 11"  (1.803 m)  Wt 325 lb 9.6 oz (147.691 kg)  BMI 45.41 kg/m2 GENERAL:  Well appearing NECK:  No jugular venous distention, waveform within normal limits, carotid upstroke brisk and symmetric, questionable left bruits, no thyromegaly LYMPHATICS:  No cervical, inguinal adenopathy LUNGS:  Clear to auscultation bilaterally CHEST:  Well healed sternotomy scar. HEART:  PMI not displaced or sustained,S1 and S2 within normal limits, no S3, no S4, no clicks, no rubs, apical systolic murmur radiating out the outflow tract ABD:  Flat, positive bowel sounds normal in frequency in pitch,  no bruits, no rebound, no guarding, no midline pulsatile mass, no hepatomegaly, no splenomegaly, morbidly obese EXT:  2 plus pulses upper, two plus PT bilateral, no edema, no cyanosis no clubbing, bandaged right great toe SKIN:  No rashes no nodules, bandaged on his right great toe NEURO:  Cranial nerves II through XII grossly intact, motor grossly intact throughout   ASSESSMENT AND PLAN  CVA I was able to review all of the records from [facility]. He did have an occipital stroke demonstrated on MRI. There was some hemorrhagic conversion. He was taken off Plavix and he is on aspirin only. I reviewed his echocardiogram which demonstrated no significant abnormalities. He did have bilateral 40-59% carotid stenosis. He will continue to be followed I neurology very   TACHYBRADY I reviewed a 24-hour monitor to do that I applied. He has tachybradycardia arrhythmia but no syndrome associated.   He feels some occasional nonsustained tachycardia palpitations. He is not feel it when his heart rate is low though it has been recorded in the 30s and 40s. He seems to have appropriate chronotropic response with activity. He's had no presyncope or syncope. There were no sustained causes. Therefore, there is no class I indication for a pacemaker. However, I suspect his head in this direction in the future. We talked at length about symptoms that could indicate the need for a pacemaker. Of particular interest I did not see any clear evidence of atrial fibrillation. Regardless with his recent hemorrhagic stroke he would not be a warfarin or novel agent candidate.  CORONARY HEART DISEASE -  The patient has no new sypmtoms. No further cardiovascular testing is indicated. We will continue with aggressive risk reduction and meds as listed.   HYPERTENSION -  The blood pressure is at target. No change in medications is indicated. We will continue with therapeutic lifestyle changes (TLC).  Obesities, morbid -    The patient understands the need to lose weight with diet and exercise. We have discussed specific strategies for this.

## 2012-05-26 NOTE — Patient Instructions (Addendum)
The current medical regimen is effective;  continue present plan and medications.  Follow up in 4 months with Dr Hochrein.  You will receive a letter in the mail 2 months before you are due.  Please call us when you receive this letter to schedule your follow up appointment.  

## 2012-05-26 NOTE — Telephone Encounter (Signed)
Records Rec From [facility] gave to [facility] 05/26/12/KM

## 2012-05-29 DIAGNOSIS — I6330 Cerebral infarction due to thrombosis of unspecified cerebral artery: Secondary | ICD-10-CM | POA: Diagnosis not present

## 2012-05-29 DIAGNOSIS — I6789 Other cerebrovascular disease: Secondary | ICD-10-CM | POA: Diagnosis not present

## 2012-05-29 DIAGNOSIS — H53469 Homonymous bilateral field defects, unspecified side: Secondary | ICD-10-CM | POA: Diagnosis not present

## 2012-05-30 DIAGNOSIS — L97509 Non-pressure chronic ulcer of other part of unspecified foot with unspecified severity: Secondary | ICD-10-CM | POA: Diagnosis not present

## 2012-05-30 DIAGNOSIS — L84 Corns and callosities: Secondary | ICD-10-CM | POA: Diagnosis not present

## 2012-05-30 DIAGNOSIS — E1169 Type 2 diabetes mellitus with other specified complication: Secondary | ICD-10-CM | POA: Diagnosis not present

## 2012-06-06 DIAGNOSIS — R894 Abnormal immunological findings in specimens from other organs, systems and tissues: Secondary | ICD-10-CM | POA: Diagnosis not present

## 2012-06-06 DIAGNOSIS — L97509 Non-pressure chronic ulcer of other part of unspecified foot with unspecified severity: Secondary | ICD-10-CM | POA: Diagnosis not present

## 2012-06-06 DIAGNOSIS — E1169 Type 2 diabetes mellitus with other specified complication: Secondary | ICD-10-CM | POA: Diagnosis not present

## 2012-06-06 DIAGNOSIS — L84 Corns and callosities: Secondary | ICD-10-CM | POA: Diagnosis not present

## 2012-06-08 ENCOUNTER — Ambulatory Visit: Payer: Medicare Other | Admitting: Cardiology

## 2012-06-13 DIAGNOSIS — L97509 Non-pressure chronic ulcer of other part of unspecified foot with unspecified severity: Secondary | ICD-10-CM | POA: Diagnosis not present

## 2012-06-19 ENCOUNTER — Telehealth: Payer: Self-pay | Admitting: Cardiology

## 2012-06-19 NOTE — Telephone Encounter (Signed)
Additional Records Received From Dr.Applegate[facility] VIA [facility] gave to [facility] 06/19/12/KM

## 2012-06-20 ENCOUNTER — Encounter (HOSPITAL_BASED_OUTPATIENT_CLINIC_OR_DEPARTMENT_OTHER): Payer: Medicare Other | Attending: General Surgery

## 2012-06-20 DIAGNOSIS — E1169 Type 2 diabetes mellitus with other specified complication: Secondary | ICD-10-CM | POA: Diagnosis not present

## 2012-06-20 DIAGNOSIS — L97509 Non-pressure chronic ulcer of other part of unspecified foot with unspecified severity: Secondary | ICD-10-CM | POA: Insufficient documentation

## 2012-06-30 DIAGNOSIS — L97509 Non-pressure chronic ulcer of other part of unspecified foot with unspecified severity: Secondary | ICD-10-CM | POA: Diagnosis not present

## 2012-07-04 ENCOUNTER — Ambulatory Visit: Payer: Medicare Other | Admitting: Vascular Surgery

## 2012-07-04 DIAGNOSIS — L97509 Non-pressure chronic ulcer of other part of unspecified foot with unspecified severity: Secondary | ICD-10-CM | POA: Diagnosis not present

## 2012-07-11 ENCOUNTER — Ambulatory Visit: Payer: Medicare Other | Admitting: Vascular Surgery

## 2012-07-11 DIAGNOSIS — E1169 Type 2 diabetes mellitus with other specified complication: Secondary | ICD-10-CM | POA: Diagnosis not present

## 2012-07-11 DIAGNOSIS — L97509 Non-pressure chronic ulcer of other part of unspecified foot with unspecified severity: Secondary | ICD-10-CM | POA: Diagnosis not present

## 2012-07-18 ENCOUNTER — Encounter (HOSPITAL_BASED_OUTPATIENT_CLINIC_OR_DEPARTMENT_OTHER): Payer: Medicare Other | Attending: General Surgery

## 2012-07-18 DIAGNOSIS — L97509 Non-pressure chronic ulcer of other part of unspecified foot with unspecified severity: Secondary | ICD-10-CM | POA: Diagnosis not present

## 2012-07-18 DIAGNOSIS — E1169 Type 2 diabetes mellitus with other specified complication: Secondary | ICD-10-CM | POA: Diagnosis not present

## 2012-07-18 DIAGNOSIS — L84 Corns and callosities: Secondary | ICD-10-CM | POA: Insufficient documentation

## 2012-07-19 DIAGNOSIS — H53469 Homonymous bilateral field defects, unspecified side: Secondary | ICD-10-CM | POA: Diagnosis not present

## 2012-07-19 DIAGNOSIS — I6789 Other cerebrovascular disease: Secondary | ICD-10-CM | POA: Diagnosis not present

## 2012-07-19 DIAGNOSIS — I6330 Cerebral infarction due to thrombosis of unspecified cerebral artery: Secondary | ICD-10-CM | POA: Diagnosis not present

## 2012-07-22 ENCOUNTER — Other Ambulatory Visit: Payer: Self-pay | Admitting: Cardiology

## 2012-07-25 DIAGNOSIS — L84 Corns and callosities: Secondary | ICD-10-CM | POA: Diagnosis not present

## 2012-07-25 DIAGNOSIS — L97509 Non-pressure chronic ulcer of other part of unspecified foot with unspecified severity: Secondary | ICD-10-CM | POA: Diagnosis not present

## 2012-07-25 DIAGNOSIS — E1169 Type 2 diabetes mellitus with other specified complication: Secondary | ICD-10-CM | POA: Diagnosis not present

## 2012-08-01 DIAGNOSIS — L97509 Non-pressure chronic ulcer of other part of unspecified foot with unspecified severity: Secondary | ICD-10-CM | POA: Diagnosis not present

## 2012-08-01 DIAGNOSIS — L84 Corns and callosities: Secondary | ICD-10-CM | POA: Diagnosis not present

## 2012-08-01 DIAGNOSIS — E1169 Type 2 diabetes mellitus with other specified complication: Secondary | ICD-10-CM | POA: Diagnosis not present

## 2012-08-08 DIAGNOSIS — L97509 Non-pressure chronic ulcer of other part of unspecified foot with unspecified severity: Secondary | ICD-10-CM | POA: Diagnosis not present

## 2012-08-08 DIAGNOSIS — L84 Corns and callosities: Secondary | ICD-10-CM | POA: Diagnosis not present

## 2012-08-08 DIAGNOSIS — E1169 Type 2 diabetes mellitus with other specified complication: Secondary | ICD-10-CM | POA: Diagnosis not present

## 2012-08-14 ENCOUNTER — Encounter: Payer: Self-pay | Admitting: Vascular Surgery

## 2012-08-14 ENCOUNTER — Other Ambulatory Visit: Payer: Self-pay | Admitting: Cardiology

## 2012-08-14 NOTE — Telephone Encounter (Signed)
..   Requested Prescriptions   Pending Prescriptions Disp Refills  . labetalol (NORMODYNE) 200 MG tablet [Pharmacy Med Name: LABETALOL 200MG      TAB] 180 tablet 6    Sig: TAKE TWO TABLETS BY MOUTH THREE TIMES DAILY

## 2012-08-15 ENCOUNTER — Encounter: Payer: Self-pay | Admitting: Vascular Surgery

## 2012-08-15 ENCOUNTER — Ambulatory Visit (INDEPENDENT_AMBULATORY_CARE_PROVIDER_SITE_OTHER): Payer: Medicare Other | Admitting: Vascular Surgery

## 2012-08-15 VITALS — BP 134/62 | HR 64 | Resp 18 | Ht 71.0 in | Wt 320.0 lb

## 2012-08-15 DIAGNOSIS — I83219 Varicose veins of right lower extremity with both ulcer of unspecified site and inflammation: Secondary | ICD-10-CM | POA: Diagnosis not present

## 2012-08-15 DIAGNOSIS — I83229 Varicose veins of left lower extremity with both ulcer of unspecified site and inflammation: Secondary | ICD-10-CM | POA: Diagnosis not present

## 2012-08-15 DIAGNOSIS — L97929 Non-pressure chronic ulcer of unspecified part of left lower leg with unspecified severity: Secondary | ICD-10-CM | POA: Insufficient documentation

## 2012-08-15 NOTE — Progress Notes (Signed)
Subjective:     Patient ID: Kerry Waters, male   DOB: 04/15/1946, 67 y.o.   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 He has adequate arterial flow to both lower extremities. The ulcer on the right first toe continues to not heal despite wound care treatments. He has known gross reflux in both great saphenous systems with chronic skin changes in the lower third of both legs but no active ulcerations other than the one described above. He has no history of DVT. He has been trying longer elastic compression stockings as well as elevation and ibuprofen.  Past Medical History  Diagnosis Date  . Joint pain   . Ulcer of foot   . Ischemic heart disease   . MI (myocardial infarction) 1987    ANTERIOR SEPTAL  . Hypokinesis     MILD INFERIOR  . Hypertension   . Edema of lower extremity   . Obesities, morbid   . SVT (supraventricular tachycardia)   . Hx of CABG 1993  . Abnormal cardiovascular stress test 2008    EF 48%. No clear cut ischemia. Low risk scan  . Diabetes mellitus     Dr. Timothy Lasso follows.  [name] PVD (peripheral vascular disease)   . Venous stasis   . Peripheral neuropathy   . Hyperlipidemia   . Coronary artery disease   . Sleep apnea     CPAP  . History of angina   . Cancer     basil cell carcinoma  . Stroke     pt states possible stroke on 04/25/2012    History  Substance Use Topics  . Smoking status: Former Smoker -- 1.00 packs/day for 30 years    Types: Cigarettes    Quit date: 04/19/1985  . Smokeless tobacco: Never Used  . Alcohol Use: No    Family History  Problem Relation Age of Onset  . Heart attack Father   . Heart disease Father     before age 5  . Cancer Mother     stomach cancer  . Heart attack Maternal Grandfather   . Heart attack Paternal Grandfather   . Cancer Sister     breast  . Diabetes Son     No Known  Allergies  Current outpatient prescriptions:aspirin 325 MG tablet, Take 325 mg by mouth daily. , Disp: , Rfl: ;  atorvastatin (LIPITOR) 40 MG tablet, TAKE ONE TABLET BY MOUTH AT BEDTIME, Disp: 30 tablet, Rfl: 11;  Canagliflozin (INVOKANA) 100 MG TABS, Take by mouth., Disp: , Rfl: ;  Coenzyme Q10 300 MG CAPS, Take 1 capsule by mouth daily.  , Disp: , Rfl:  diltiazem (CARDIZEM CD) 180 MG 24 hr capsule, TAKE ONE CAPSULE BY MOUTH TWICE DAILY, Disp: 180 capsule, Rfl: 2;  Exenatide (BYETTA 5 MCG PEN [place]), Inject 10 mg into the skin 2 (two) times daily. , Disp: , Rfl: ;  glimepiride (AMARYL) 4 MG tablet, Take 1.5 tablets by mouth qd, Disp: , Rfl: ;  GLUCOSAMINE PO, Take 1 capsule by mouth daily. , Disp: , Rfl:  hydrochlorothiazide (HYDRODIURIL) 25 MG tablet, TAKE ONE TABLET BY MOUTH EVERY DAY, Disp: 30 tablet, Rfl: 3;  labetalol (NORMODYNE) 200 MG tablet, , Disp: , Rfl: ;  labetalol (NORMODYNE) 200 MG tablet, TAKE TWO TABLETS BY MOUTH THREE TIMES DAILY, Disp: 180 tablet, Rfl: 6;  LEVEMIR FLEXPEN 100 UNIT/ML injection, Inject 45 Units as directed daily.,  Disp: , Rfl:  lisinopril (PRINIVIL,ZESTRIL) 40 MG tablet, TAKE ONE TABLET BY MOUTH EVERY DAY, Disp: 90 tablet, Rfl: 3;  metFORMIN (GLUCOPHAGE) 1000 MG tablet, Take 1,000 mg by mouth 2 (two) times daily with a meal.  , Disp: , Rfl: ;  Multiple Vitamin (MULTIVITAMIN) tablet, Take 1 tablet by mouth daily.  , Disp: , Rfl: ;  spironolactone (ALDACTONE) 25 MG tablet, Take 1 tablet (25 mg total) by mouth daily., Disp: 90 tablet, Rfl: 3  BP 134/62  Pulse 64  Resp 18  Ht 5\' 11"  (1.803 m)  Wt 320 lb (145.151 kg)  BMI 44.65 kg/m2  Body mass index is 44.65 kg/(m^2).          Review of Systems denies chest pain, dyspnea on exertion, PND, orthopnea, hemoptysis     Objective:   Physical Exam blood pressure 134/62 heart rate 64 respirations 18 General well-developed well-nourished male in no apparent stress alert and oriented x3-obese Both legs have severe  hyperpigmentation with crusty thickened skin from midcalf distally. There is a ulcer on the medial aspect of the right first toe.       Assessment:     Severe bilateral venous insufficiency with nonhealing ulcer right first toe an adequate arterial supply with severe skin changes both legs known gross reflux bilateral grade saphenous veins    Plan:     Patient needs #1 laser ablation right great saphenous vein followed by #2 laser ablation left great saphenous vein and then return to wound center for continued treatment Will proceed with precertification to perform this in the near future

## 2012-08-16 ENCOUNTER — Other Ambulatory Visit: Payer: Self-pay | Admitting: *Deleted

## 2012-08-16 DIAGNOSIS — I83211 Varicose veins of right lower extremity with both ulcer of thigh and inflammation: Secondary | ICD-10-CM

## 2012-08-22 ENCOUNTER — Encounter (HOSPITAL_BASED_OUTPATIENT_CLINIC_OR_DEPARTMENT_OTHER): Payer: Medicare Other | Attending: General Surgery

## 2012-08-22 DIAGNOSIS — L84 Corns and callosities: Secondary | ICD-10-CM | POA: Diagnosis not present

## 2012-08-22 DIAGNOSIS — L97509 Non-pressure chronic ulcer of other part of unspecified foot with unspecified severity: Secondary | ICD-10-CM | POA: Diagnosis not present

## 2012-08-22 DIAGNOSIS — E1169 Type 2 diabetes mellitus with other specified complication: Secondary | ICD-10-CM | POA: Diagnosis not present

## 2012-08-25 ENCOUNTER — Encounter: Payer: Self-pay | Admitting: Vascular Surgery

## 2012-08-28 ENCOUNTER — Ambulatory Visit (INDEPENDENT_AMBULATORY_CARE_PROVIDER_SITE_OTHER): Payer: Medicare Other | Admitting: Vascular Surgery

## 2012-08-28 ENCOUNTER — Encounter: Payer: Self-pay | Admitting: Vascular Surgery

## 2012-08-28 VITALS — BP 137/72 | HR 67 | Resp 18 | Ht 71.0 in | Wt 320.0 lb

## 2012-08-28 DIAGNOSIS — L97919 Non-pressure chronic ulcer of unspecified part of right lower leg with unspecified severity: Secondary | ICD-10-CM | POA: Diagnosis not present

## 2012-08-28 DIAGNOSIS — I83219 Varicose veins of right lower extremity with both ulcer of unspecified site and inflammation: Secondary | ICD-10-CM

## 2012-08-28 DIAGNOSIS — L97929 Non-pressure chronic ulcer of unspecified part of left lower leg with unspecified severity: Secondary | ICD-10-CM | POA: Diagnosis not present

## 2012-08-28 DIAGNOSIS — I83211 Varicose veins of right lower extremity with both ulcer of thigh and inflammation: Secondary | ICD-10-CM

## 2012-08-28 NOTE — Progress Notes (Signed)
Subjective:     Patient ID: Kerry Waters, male   DOB: 04-08-1946, 67 y.o.   MRN: [id number]  HPI this 67 year old male had laser ablation of the right great saphenous vein from the mid calf to the saphenofemoral junction performed under local tumescent anesthesia for severe venous hypertension with skin changes. A total of 2348 J of energy was utilized. He tolerated the procedure well.  Review of Systems     Objective:   Physical Exam BP 137/72  Pulse 67  Resp 18  Ht 5\' 11"  (1.803 m)  Wt 320 lb (145.151 kg)  BMI 44.65 kg/m2        Assessment:     Well-tolerated laser ablation right great saphenous vein from mid calf to saphenofemoral junction performed under local tumescent anesthesia    Plan:     Return in one week for a venous duplex exam right leg confirm closure right great saphenous vein. We'll then schedule same procedure on the left leg for identical problem

## 2012-08-28 NOTE — Progress Notes (Signed)
Laser Ablation Procedure      Date: 08/28/2012    Kerry Waters DOB:08-18-1945  Consent signed: Yes  Surgeon:J.D. Hart [place]  Procedure: Laser Ablation: right Greater Saphenous Vein  BP 137/72  Pulse 67  Resp 18  Ht 5\' 11"  (1.803 m)  Wt 320 lb (145.151 kg)  BMI 44.65 kg/m2  Start time: 11:00   End time: 11:50  Tumescent Anesthesia: 480 cc 0.9% NaCl with 50 cc Lidocaine HCL with 1% Epi and 15 cc 8.4% NaHCO3  Local Anesthesia: 6 cc Lidocaine HCL and NaHCO3 (ratio 2:1)  Pulsed mode:yes Watts 15 Seconds 1 Pulses:1 Total Pulses:157 Total Energy: 2348 Total Time: 2:36     Patient tolerated procedure well: Yes  Notes:   Description of Procedure:  After marking the course of the saphenous vein and the secondary varicosities in the standing position, the patient was placed on the operating table in the supine position, and the right leg was prepped and draped in sterile fashion. Local anesthetic was administered, and under ultrasound guidance the saphenous vein was accessed with a micro needle and guide wire; then the micro puncture sheath was placed. A guide wire was inserted to the saphenofemoral junction, followed by a 5 french sheath.  The position of the sheath and then the laser fiber below the junction was confirmed using the ultrasound and visualization of the aiming beam.  Tumescent anesthesia was administered along the course of the saphenous vein using ultrasound guidance. Protective laser glasses were placed on the patient, and the laser was fired at 15 watt pulsed mode advancing 1-2 mm per sec.  For a total of 2348 joules.  A steri strip was applied to the puncture site.    ABD pads and thigh high compression stockings were applied.  Ace wrap bandages were applied over the phlebectomy sites and at the top of the saphenofemoral junction.  Blood loss was less than 15 cc.  The patient ambulated out of the operating room having tolerated the procedure well.

## 2012-08-29 ENCOUNTER — Telehealth: Payer: Self-pay | Admitting: *Deleted

## 2012-08-29 NOTE — Telephone Encounter (Signed)
Pt. Doing well. No problems or concerns. Following all instructions. Reminded him of his fu appt.

## 2012-08-30 ENCOUNTER — Encounter: Payer: Self-pay | Admitting: Vascular Surgery

## 2012-09-04 ENCOUNTER — Encounter: Payer: Self-pay | Admitting: Vascular Surgery

## 2012-09-05 ENCOUNTER — Ambulatory Visit (INDEPENDENT_AMBULATORY_CARE_PROVIDER_SITE_OTHER): Payer: Medicare Other | Admitting: Vascular Surgery

## 2012-09-05 ENCOUNTER — Encounter (INDEPENDENT_AMBULATORY_CARE_PROVIDER_SITE_OTHER): Payer: Medicare Other | Admitting: *Deleted

## 2012-09-05 ENCOUNTER — Encounter: Payer: Self-pay | Admitting: Vascular Surgery

## 2012-09-05 ENCOUNTER — Other Ambulatory Visit: Payer: Self-pay | Admitting: *Deleted

## 2012-09-05 VITALS — BP 143/71 | HR 54 | Ht 71.0 in | Wt 333.0 lb

## 2012-09-05 DIAGNOSIS — L97509 Non-pressure chronic ulcer of other part of unspecified foot with unspecified severity: Secondary | ICD-10-CM | POA: Diagnosis not present

## 2012-09-05 DIAGNOSIS — I83893 Varicose veins of bilateral lower extremities with other complications: Secondary | ICD-10-CM | POA: Diagnosis not present

## 2012-09-05 DIAGNOSIS — Z48812 Encounter for surgical aftercare following surgery on the circulatory system: Secondary | ICD-10-CM

## 2012-09-05 DIAGNOSIS — L84 Corns and callosities: Secondary | ICD-10-CM | POA: Diagnosis not present

## 2012-09-05 DIAGNOSIS — E1169 Type 2 diabetes mellitus with other specified complication: Secondary | ICD-10-CM | POA: Diagnosis not present

## 2012-09-05 DIAGNOSIS — I83219 Varicose veins of right lower extremity with both ulcer of unspecified site and inflammation: Secondary | ICD-10-CM

## 2012-09-05 NOTE — Progress Notes (Signed)
Subjective:     Patient ID: Kerry Waters, male   DOB: 1945-08-14, 67 y.o.   MRN: [id number]  HPI this 67 year old male returns 1 week post laser ablation right great saphenous vein. 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 Also has severe chronic skin changes. He had some mild discomfort in the thigh where the ablation was performed but notices that the lower leg feels less tight. He has been wearing his elastic compression stockings as instructed.  Past Medical History  Diagnosis Date  . Joint pain   . Ulcer of foot   . Ischemic heart disease   . MI (myocardial infarction) 1987    ANTERIOR SEPTAL  . Hypokinesis     MILD INFERIOR  . Hypertension   . Edema of lower extremity   . Obesities, morbid   . SVT (supraventricular tachycardia)   . Hx of CABG 1993  . Abnormal cardiovascular stress test 2008    EF 48%. No clear cut ischemia. Low risk scan  . Diabetes mellitus     Dr. Timothy Lasso follows.  [name] PVD (peripheral vascular disease)   . Venous stasis   . Peripheral neuropathy   . Hyperlipidemia   . Coronary artery disease   . Sleep apnea     CPAP  . History of angina   . Cancer     basil cell carcinoma  . Stroke     pt states possible stroke on 04/25/2012    History  Substance Use Topics  . Smoking status: Former Smoker -- 1.00 packs/day for 30 years    Types: Cigarettes    Quit date: 04/19/1985  . Smokeless tobacco: Never Used  . Alcohol Use: No    Family History  Problem Relation Age of Onset  . Heart attack Father   . Heart disease Father     before age 71  . Cancer Mother     stomach cancer  . Heart attack Maternal Grandfather   . Heart attack Paternal Grandfather   . Cancer Sister     breast  . Diabetes Son     No Known Allergies  Current outpatient prescriptions:aspirin 325 MG tablet, Take 325 mg by mouth daily. , Disp: , Rfl: ;  atorvastatin (LIPITOR) 40 MG tablet, TAKE ONE TABLET BY MOUTH AT BEDTIME, Disp:  30 tablet, Rfl: 11;  Canagliflozin (INVOKANA) 100 MG TABS, Take by mouth., Disp: , Rfl: ;  Coenzyme Q10 300 MG CAPS, Take 1 capsule by mouth daily.  , Disp: , Rfl:  diltiazem (CARDIZEM CD) 180 MG 24 hr capsule, TAKE ONE CAPSULE BY MOUTH TWICE DAILY, Disp: 180 capsule, Rfl: 2;  doxycycline (VIBRA-TABS) 100 MG tablet, Take 1 tablet by mouth 2 (two) times daily., Disp: , Rfl: ;  Exenatide (BYETTA 5 MCG PEN [place]), Inject 10 mg into the skin 2 (two) times daily. , Disp: , Rfl: ;  glimepiride (AMARYL) 4 MG tablet, Take 1.5 tablets by mouth qd, Disp: , Rfl:  GLUCOSAMINE PO, Take 1 capsule by mouth daily. , Disp: , Rfl: ;  hydrochlorothiazide (HYDRODIURIL) 25 MG tablet, TAKE ONE TABLET BY MOUTH EVERY DAY, Disp: 30 tablet, Rfl: 3;  labetalol (NORMODYNE) 200 MG tablet, , Disp: , Rfl: ;  labetalol (NORMODYNE) 200 MG tablet, TAKE TWO TABLETS BY MOUTH THREE TIMES DAILY, Disp: 180 tablet, Rfl: 6;  LEVEMIR FLEXPEN 100 UNIT/ML injection, Inject 45 Units as directed daily., Disp: , Rfl:  lisinopril (PRINIVIL,ZESTRIL) 40 MG tablet, TAKE ONE  TABLET BY MOUTH EVERY DAY, Disp: 90 tablet, Rfl: 3;  metFORMIN (GLUCOPHAGE) 1000 MG tablet, Take 1,000 mg by mouth 2 (two) times daily with a meal.  , Disp: , Rfl: ;  Multiple Vitamin (MULTIVITAMIN) tablet, Take 1 tablet by mouth daily.  , Disp: , Rfl: ;  spironolactone (ALDACTONE) 25 MG tablet, Take 1 tablet (25 mg total) by mouth daily., Disp: 90 tablet, Rfl: 3  BP 143/71  Pulse 54  Ht 5\' 11"  (1.803 m)  Wt 333 lb (151.048 kg)  BMI 46.46 kg/m2  SpO2 95%  Body mass index is 46.46 kg/(m^2).           Review of Systems denies chest pain, dyspnea on exertion, PND, orthopnea, hemoptysis, claudication     Objective:   Physical Exam blood pressure 143/71 heart rate 84 respirations 16 General obese male who is in no apparent distress alert and oriented x3 Lungs no rhonchi or wheezing Right leg with mild tenderness along course of great saphenous vein from saphenofemoral  junction to proximal calf. Severe hyperpigmentation lower third of the leg with 1+ edema. Ulcer right first toe is clean and about 5-6 mm in diameter on plantar surface.  Today in order to a venous duplex exam the right leg which I reviewed TURP. There is no DVT. The right great saphenous vein was closed from the proximal calf to the saphenofemoral junction     Assessment:     Successful laser ablation right great saphenous vein with nonhealing ulcer right first toe    Plan:     Plan similar procedure left great saphenous vein in the near future for severe venous insufficiency with hyperpigmentation edema

## 2012-09-12 DIAGNOSIS — L97509 Non-pressure chronic ulcer of other part of unspecified foot with unspecified severity: Secondary | ICD-10-CM | POA: Diagnosis not present

## 2012-09-12 DIAGNOSIS — E1169 Type 2 diabetes mellitus with other specified complication: Secondary | ICD-10-CM | POA: Diagnosis not present

## 2012-09-12 DIAGNOSIS — L84 Corns and callosities: Secondary | ICD-10-CM | POA: Diagnosis not present

## 2012-09-19 ENCOUNTER — Encounter (HOSPITAL_BASED_OUTPATIENT_CLINIC_OR_DEPARTMENT_OTHER): Payer: Medicare Other | Attending: General Surgery

## 2012-09-19 ENCOUNTER — Other Ambulatory Visit: Payer: Self-pay | Admitting: *Deleted

## 2012-09-19 DIAGNOSIS — E1169 Type 2 diabetes mellitus with other specified complication: Secondary | ICD-10-CM | POA: Diagnosis not present

## 2012-09-19 DIAGNOSIS — L97509 Non-pressure chronic ulcer of other part of unspecified foot with unspecified severity: Secondary | ICD-10-CM | POA: Diagnosis not present

## 2012-09-19 DIAGNOSIS — L84 Corns and callosities: Secondary | ICD-10-CM | POA: Insufficient documentation

## 2012-09-25 ENCOUNTER — Other Ambulatory Visit: Payer: Medicare Other | Admitting: Vascular Surgery

## 2012-09-26 DIAGNOSIS — E1169 Type 2 diabetes mellitus with other specified complication: Secondary | ICD-10-CM | POA: Diagnosis not present

## 2012-09-26 DIAGNOSIS — L97509 Non-pressure chronic ulcer of other part of unspecified foot with unspecified severity: Secondary | ICD-10-CM | POA: Diagnosis not present

## 2012-09-26 DIAGNOSIS — L84 Corns and callosities: Secondary | ICD-10-CM | POA: Diagnosis not present

## 2012-10-02 ENCOUNTER — Ambulatory Visit: Payer: Medicare Other | Admitting: Vascular Surgery

## 2012-10-03 DIAGNOSIS — L84 Corns and callosities: Secondary | ICD-10-CM | POA: Diagnosis not present

## 2012-10-03 DIAGNOSIS — E1169 Type 2 diabetes mellitus with other specified complication: Secondary | ICD-10-CM | POA: Diagnosis not present

## 2012-10-03 DIAGNOSIS — L97509 Non-pressure chronic ulcer of other part of unspecified foot with unspecified severity: Secondary | ICD-10-CM | POA: Diagnosis not present

## 2012-10-09 ENCOUNTER — Other Ambulatory Visit: Payer: Medicare Other | Admitting: Vascular Surgery

## 2012-10-09 ENCOUNTER — Ambulatory Visit (INDEPENDENT_AMBULATORY_CARE_PROVIDER_SITE_OTHER): Payer: Medicare Other | Admitting: Cardiology

## 2012-10-09 ENCOUNTER — Encounter: Payer: Self-pay | Admitting: Cardiology

## 2012-10-09 VITALS — BP 115/60 | HR 44 | Ht 71.0 in | Wt 334.8 lb

## 2012-10-09 DIAGNOSIS — L98499 Non-pressure chronic ulcer of skin of other sites with unspecified severity: Secondary | ICD-10-CM | POA: Diagnosis not present

## 2012-10-09 DIAGNOSIS — I739 Peripheral vascular disease, unspecified: Secondary | ICD-10-CM

## 2012-10-09 DIAGNOSIS — I219 Acute myocardial infarction, unspecified: Secondary | ICD-10-CM

## 2012-10-09 DIAGNOSIS — R0989 Other specified symptoms and signs involving the circulatory and respiratory systems: Secondary | ICD-10-CM

## 2012-10-09 MED ORDER — LABETALOL HCL 200 MG PO TABS: 200.0000 mg | ORAL_TABLET | Freq: Two times a day (BID) | ORAL | Status: DC

## 2012-10-09 NOTE — Progress Notes (Signed)
HPI The patient presents as a follow up after CVA earlier this year.  He has had some junctional bradycardia on a Holter after this event. He had some nonsustained pauses. He had some sinus bradycardia. There was occasional ventricular ectopy. He also had supraventricular tachycardia none of which was particularly sustained.  Since I last saw him  He was having some lightheadedness. Dr. Timothy Lasso reduced his with a low. He still had a couple of episodes for the most part he's not having any severe symptoms. He's not having any syncope or presyncope. He denies any chest pressure, neck or arm discomfort. He denies any shortness of breath, PND or orthopnea. He's had no chest pressure, neck or arm discomfort. Unfortunately he still can ambulate with his right foot in a soft cast. I   No Known Allergies  Current Outpatient Prescriptions  Medication Sig Dispense Refill  . aspirin 325 MG tablet Take 325 mg by mouth daily.       [name] atorvastatin (LIPITOR) 40 MG tablet       . Canagliflozin (INVOKANA) 100 MG TABS Take by mouth.      . Coenzyme Q10 300 MG CAPS Take 1 capsule by mouth daily.        [name] diltiazem (CARDIZEM CD) 180 MG 24 hr capsule TAKE ONE CAPSULE BY MOUTH TWICE DAILY  180 capsule  2  . doxycycline (VIBRA-TABS) 100 MG tablet Take 1 tablet by mouth 2 (two) times daily.      . Exenatide (BYETTA 5 MCG PEN Riddle) Inject 10 mg into the skin 2 (two) times daily.       [name] glimepiride (AMARYL) 4 MG tablet 4 mg 2 (two) times daily. Take 1 in am and 1 in pm      . hydrochlorothiazide (HYDRODIURIL) 25 MG tablet TAKE ONE TABLET BY MOUTH EVERY DAY  30 tablet  3  . labetalol (NORMODYNE) 200 MG tablet       . LEVEMIR FLEXPEN 100 UNIT/ML injection Inject 45 Units as directed daily.      [name] lisinopril (PRINIVIL,ZESTRIL) 40 MG tablet TAKE ONE TABLET BY MOUTH EVERY DAY  90 tablet  3  . metFORMIN (GLUCOPHAGE) 1000 MG tablet Take 1,000 mg by mouth 2 (two) times daily with a meal.        . Multiple Vitamin  (MULTIVITAMIN) tablet Take 1 tablet by mouth daily.        [name] spironolactone (ALDACTONE) 25 MG tablet Take 1 tablet (25 mg total) by mouth daily.  90 tablet  3  . vitamin C (ASCORBIC ACID) 500 MG tablet Take 500 mg by mouth daily.      . Zinc 40 MG TABS Take by mouth.       No current facility-administered medications for this visit.    Past Medical History  Diagnosis Date  . Joint pain   . Ulcer of foot   . Ischemic heart disease   . MI (myocardial infarction) 1987    ANTERIOR SEPTAL  . Hypokinesis     MILD INFERIOR  . Hypertension   . Edema of lower extremity   . Obesities, morbid   . SVT (supraventricular tachycardia)   . Hx of CABG 1993  . Abnormal cardiovascular stress test 2008    EF 48%. No clear cut ischemia. Low risk scan  . Diabetes mellitus     Dr. Timothy Lasso follows.  [name] PVD (peripheral vascular disease)   . Venous stasis   . Peripheral neuropathy   . Hyperlipidemia   .  Coronary artery disease   . Sleep apnea     CPAP  . History of angina   . Cancer     basil cell carcinoma  . Stroke     pt states possible stroke on 04/25/2012    Past Surgical History  Procedure Laterality Date  . Coronary artery bypass graft  1993    LIMA to LAD with patch angioplasty, SVG to OM, SVG to OM & PD and patch angioplasy to PDA  . Left ankle orif  2000    ROS:  As stated in the HPI and negative for all other systems.  PHYSICAL EXAM BP 115/60  Pulse 44  Ht 5\' 11"  (1.803 m)  Wt 334 lb 12.8 oz (151.864 kg)  BMI 46.72 kg/m2 GENERAL:  Well appearing NECK:  No jugular venous distention, waveform within normal limits, carotid upstroke brisk and symmetric, questionable left bruits, no thyromegaly LYMPHATICS:  No cervical, inguinal adenopathy LUNGS:  Clear to auscultation bilaterally CHEST:  Well healed sternotomy scar. HEART:  PMI not displaced or sustained,S1 and S2 within normal limits, no S3, no S4, no clicks, no rubs, apical systolic murmur radiating out the outflow tract ABD:   Flat, positive bowel sounds normal in frequency in pitch, no bruits, no rebound, no guarding, no midline pulsatile mass, no hepatomegaly, no splenomegaly, morbidly obese EXT:  2 plus pulses upper, two plus PT bilateral, no edema, no cyanosis no clubbing, bandaged right great toe NEURO:  Cranial nerves II through XII grossly intact, motor grossly intact throughout  EKG:  Junctional rhythm with this her atrial contractions, axis within normal limits, early transition, nonspecific T-wave changes  10/09/2012  ASSESSMENT AND PLAN  CVA He remains off Plavix and he is on aspirin only as he had stroke with hemorrhagic conversion. He did have bilateral 40-59% carotid stenosis. He is no longer seeing neurology.  I will schedule follow up carotid Doppler in January.   TACHYBRADY I reviewed a 24-hour monitor to do that I applied. He has tachybradycardia arrhythmia but no syndrome associated.   He is not feeling any tachycardic palpitations. He seems to have appropriate chronotropic response with activity. He's had no presyncope or syncope. There were no sustained pauses. Therefore, there is no class I indication for a pacemaker.  At this point given the junctional rhythm however I will reduce his beta blocker further.  CORONARY HEART DISEASE -  The patient has no new sypmtoms. No further cardiovascular testing is indicated. We will continue with aggressive risk reduction and meds as listed.   HYPERTENSION -  The blood pressure is at target. No change in medications is indicated. We will continue with therapeutic lifestyle changes (TLC).  Obesities, morbid -  The patient understands the need to lose weight with diet and exercise. We have discussed specific strategies for this.

## 2012-10-09 NOTE — Patient Instructions (Addendum)
Please decrease Labetalol to 200 mg one twice a day. Continue all other medications as listed  Your physician has requested that you have a carotid duplex inJanuary. This test is an ultrasound of the carotid arteries in your neck. It looks at blood flow through these arteries that supply the brain with blood. Allow one hour for this exam. There are no restrictions or special instructions.  Follow up in 6 months with Dr Antoine Poche.  You will receive a letter in the mail 2 months before you are due.  Please call us when you receive this letter to schedule your follow up appointment.

## 2012-10-10 DIAGNOSIS — L97509 Non-pressure chronic ulcer of other part of unspecified foot with unspecified severity: Secondary | ICD-10-CM | POA: Diagnosis not present

## 2012-10-10 DIAGNOSIS — L84 Corns and callosities: Secondary | ICD-10-CM | POA: Diagnosis not present

## 2012-10-10 DIAGNOSIS — E1169 Type 2 diabetes mellitus with other specified complication: Secondary | ICD-10-CM | POA: Diagnosis not present

## 2012-10-13 ENCOUNTER — Encounter: Payer: Self-pay | Admitting: Vascular Surgery

## 2012-10-16 ENCOUNTER — Encounter: Payer: Self-pay | Admitting: Vascular Surgery

## 2012-10-16 ENCOUNTER — Ambulatory Visit (INDEPENDENT_AMBULATORY_CARE_PROVIDER_SITE_OTHER): Payer: Medicare Other | Admitting: Vascular Surgery

## 2012-10-16 VITALS — BP 151/84 | HR 57 | Resp 18 | Ht 71.0 in | Wt 330.0 lb

## 2012-10-16 DIAGNOSIS — I83893 Varicose veins of bilateral lower extremities with other complications: Secondary | ICD-10-CM

## 2012-10-16 NOTE — Progress Notes (Signed)
Subjective:     Patient ID: Kerry Waters, male   DOB: 1945-12-15, 67 y.o.   MRN: [id number]  HPI this 67 year old male had laser ablation of the left great saphenous vein performed under local tumescent anesthesia for venous hypertension. Total of 1976 J of energy was utilized. He tolerated the procedure well.  Review of Systems     Objective:   Physical Exam BP 151/84  Pulse 57  Resp 18  Ht 5\' 11"  (1.803 m)  Wt 330 lb (149.687 kg)  BMI 46.05 kg/m2       Assessment:     Well-tolerated laser ablation left great saphenous vein performed under local tumescent anesthesia for venous hypertension with history of stasis ulcers    Plan:     Return in one week for venous duplex exam to confirm closure left great saphenous vein. Patient is being seen on weekly basis in the wound center

## 2012-10-16 NOTE — Progress Notes (Signed)
Laser Ablation Procedure      Date: 10/16/2012    Kerry Waters DOB:[birth year]  Consent signed: Yes  Surgeon:J.D. Hart [place]  Procedure: Laser Ablation: left Greater Saphenous Vein  BP 151/84  Pulse 57  Resp 18  Ht 5\' 11"  (1.803 m)  Wt 330 lb (149.687 kg)  BMI 46.05 kg/m2  Start time: 9:15   End time: 9:50  Tumescent Anesthesia: 350 cc 0.9% NaCl with 50 cc Lidocaine HCL with 1% Epi and 15 cc 8.4% NaHCO3  Local Anesthesia: 5 cc Lidocaine HCL and NaHCO3 (ratio 2:1)  Pulsed mode:yes Watts 15 Seconds 1 Pulses:1 Total Pulses:132 Total Energy: 1976 Total Time: 2:11     Patient tolerated procedure well: Yes  Notes:   Description of Procedure:  After marking the course of the saphenous vein and the secondary varicosities in the standing position, the patient was placed on the operating table in the supine position, and the left leg was prepped and draped in sterile fashion. Local anesthetic was administered, and under ultrasound guidance the saphenous vein was accessed with a micro needle and guide wire; then the micro puncture sheath was placed. A guide wire was inserted to the saphenofemoral junction, followed by a 5 french sheath.  The position of the sheath and then the laser fiber below the junction was confirmed using the ultrasound and visualization of the aiming beam.  Tumescent anesthesia was administered along the course of the saphenous vein using ultrasound guidance. Protective laser glasses were placed on the patient, and the laser was fired at 15 watt pulsed mode advancing 1-2 mm per sec.  For a total of 1976 joules.  A steri strip was applied to the puncture site.    ABD pads and thigh high compression stockings were applied.  Ace wrap bandages were applied over the phlebectomy sites and at the top of the saphenofemoral junction.  Blood loss was less than 15 cc.  The patient ambulated out of the operating room having tolerated the procedure well.

## 2012-10-17 ENCOUNTER — Telehealth: Payer: Self-pay | Admitting: *Deleted

## 2012-10-17 ENCOUNTER — Encounter (HOSPITAL_BASED_OUTPATIENT_CLINIC_OR_DEPARTMENT_OTHER): Payer: Medicare Other | Attending: General Surgery

## 2012-10-17 ENCOUNTER — Ambulatory Visit: Payer: Medicare Other | Admitting: Vascular Surgery

## 2012-10-17 ENCOUNTER — Encounter: Payer: Self-pay | Admitting: Vascular Surgery

## 2012-10-17 DIAGNOSIS — L84 Corns and callosities: Secondary | ICD-10-CM | POA: Insufficient documentation

## 2012-10-17 DIAGNOSIS — E1169 Type 2 diabetes mellitus with other specified complication: Secondary | ICD-10-CM | POA: Diagnosis not present

## 2012-10-17 DIAGNOSIS — G589 Mononeuropathy, unspecified: Secondary | ICD-10-CM | POA: Insufficient documentation

## 2012-10-17 DIAGNOSIS — L97509 Non-pressure chronic ulcer of other part of unspecified foot with unspecified severity: Secondary | ICD-10-CM | POA: Diagnosis not present

## 2012-10-17 NOTE — Telephone Encounter (Signed)
Pt doing ok. Following all instructions. Reminded him of his fu appt next week.

## 2012-10-23 ENCOUNTER — Encounter: Payer: Self-pay | Admitting: Vascular Surgery

## 2012-10-24 ENCOUNTER — Encounter: Payer: Self-pay | Admitting: Vascular Surgery

## 2012-10-24 ENCOUNTER — Ambulatory Visit (INDEPENDENT_AMBULATORY_CARE_PROVIDER_SITE_OTHER): Payer: Medicare Other | Admitting: Vascular Surgery

## 2012-10-24 ENCOUNTER — Encounter (INDEPENDENT_AMBULATORY_CARE_PROVIDER_SITE_OTHER): Payer: Medicare Other | Admitting: *Deleted

## 2012-10-24 VITALS — BP 127/60 | HR 71 | Ht 71.0 in | Wt 337.0 lb

## 2012-10-24 DIAGNOSIS — I83893 Varicose veins of bilateral lower extremities with other complications: Secondary | ICD-10-CM | POA: Diagnosis not present

## 2012-10-24 DIAGNOSIS — G589 Mononeuropathy, unspecified: Secondary | ICD-10-CM | POA: Diagnosis not present

## 2012-10-24 DIAGNOSIS — E1169 Type 2 diabetes mellitus with other specified complication: Secondary | ICD-10-CM | POA: Diagnosis not present

## 2012-10-24 DIAGNOSIS — L97509 Non-pressure chronic ulcer of other part of unspecified foot with unspecified severity: Secondary | ICD-10-CM | POA: Diagnosis not present

## 2012-10-24 DIAGNOSIS — L84 Corns and callosities: Secondary | ICD-10-CM | POA: Diagnosis not present

## 2012-10-24 NOTE — Progress Notes (Signed)
Subjective:     Patient ID: Kerry Waters, male   DOB: 02-05-1946, 67 y.o.   MRN: [id number]  HPI this 67 year old male returns 1 week post laser ablation left great saphenous vein for venous hypertension with severe skin changes and history of ulceration right lower extremity. He has had moderate discomfort along the course of the left great saphenous vein which is improving each day. He has been wearing a long leg elastic compression stocking and elevating his legs as instructed as well as taking ibuprofen. He states that both legs are starting to feel much less tight than they were prior to the procedures the right when feeling better than the left. He has been going to the wound Center weekly and states that the ulcer on the right toe has now healed.  Past Medical History  Diagnosis Date  . Joint pain   . Ulcer of foot   . Ischemic heart disease   . MI (myocardial infarction) 1987    ANTERIOR SEPTAL  . Hypokinesis     MILD INFERIOR  . Hypertension   . Edema of lower extremity   . Obesities, morbid   . SVT (supraventricular tachycardia)   . Hx of CABG 1993  . Abnormal cardiovascular stress test 2008    EF 48%. No clear cut ischemia. Low risk scan  . Diabetes mellitus     Dr. Timothy Lasso follows.  [name] PVD (peripheral vascular disease)   . Venous stasis   . Peripheral neuropathy   . Hyperlipidemia   . Coronary artery disease   . Sleep apnea     CPAP  . History of angina   . Cancer     basil cell carcinoma  . Stroke     pt states possible stroke on 04/25/2012    History  Substance Use Topics  . Smoking status: Former Smoker -- 1.00 packs/day for 30 years    Types: Cigarettes    Quit date: 04/19/1985  . Smokeless tobacco: Never Used  . Alcohol Use: No    Family History  Problem Relation Age of Onset  . Heart attack Father   . Heart disease Father     before age 50  . Cancer Mother     stomach cancer  . Heart attack Maternal Grandfather   . Heart attack Paternal  Grandfather   . Cancer Sister     breast  . Diabetes Son     No Known Allergies  Current outpatient prescriptions:aspirin 325 MG tablet, Take 325 mg by mouth daily. , Disp: , Rfl: ;  atorvastatin (LIPITOR) 40 MG tablet, , Disp: , Rfl: ;  Canagliflozin (INVOKANA) 100 MG TABS, Take by mouth., Disp: , Rfl: ;  Coenzyme Q10 300 MG CAPS, Take 1 capsule by mouth daily.  , Disp: , Rfl: ;  diltiazem (CARDIZEM CD) 180 MG 24 hr capsule, TAKE ONE CAPSULE BY MOUTH TWICE DAILY, Disp: 180 capsule, Rfl: 2 doxycycline (VIBRA-TABS) 100 MG tablet, Take 1 tablet by mouth 2 (two) times daily., Disp: , Rfl: ;  Exenatide (BYETTA 5 MCG PEN [place]), Inject 10 mg into the skin 2 (two) times daily. , Disp: , Rfl: ;  glimepiride (AMARYL) 4 MG tablet, 4 mg 2 (two) times daily. Take 1 in am and 1 in pm, Disp: , Rfl: ;  hydrochlorothiazide (HYDRODIURIL) 25 MG tablet, TAKE ONE TABLET BY MOUTH EVERY DAY, Disp: 30 tablet, Rfl: 3 labetalol (NORMODYNE) 200 MG tablet, Take 1 tablet (200 mg total) by mouth 2 (two) times  daily., Disp: , Rfl: ;  LEVEMIR FLEXPEN 100 UNIT/ML injection, Inject 45 Units as directed daily., Disp: , Rfl: ;  lisinopril (PRINIVIL,ZESTRIL) 40 MG tablet, TAKE ONE TABLET BY MOUTH EVERY DAY, Disp: 90 tablet, Rfl: 3;  metFORMIN (GLUCOPHAGE) 1000 MG tablet, Take 1,000 mg by mouth 2 (two) times daily with a meal.  , Disp: , Rfl:  Multiple Vitamin (MULTIVITAMIN) tablet, Take 1 tablet by mouth daily.  , Disp: , Rfl: ;  vitamin C (ASCORBIC ACID) 500 MG tablet, Take 500 mg by mouth daily., Disp: , Rfl: ;  Zinc 40 MG TABS, Take by mouth., Disp: , Rfl: ;  spironolactone (ALDACTONE) 25 MG tablet, Take 1 tablet (25 mg total) by mouth daily., Disp: 90 tablet, Rfl: 3  BP 127/60  Pulse 71  Ht 5\' 11"  (1.803 m)  Wt 337 lb (152.862 kg)  BMI 47.02 kg/m2  SpO2 90%  Body mass index is 47.02 kg/(m^2).          Review of Systems denies chest pain, dyspnea on exertion, PND, orthopnea, hemoptysis     Objective:   Physical Exam  blood pressure 127/60 heart rate 71 respirations 18 General well-developed well-nourished male-obese-no apparent distress alert and oriented x3 Left lower extremity with 3+ dorsalis pedis pulse palpable. Mild tenderness along course of left great saphenous vein. Chronic 1+ edema with severe hyperpigmentation lower third of the leg.  Today I ordered venous duplex exam the left leg which are reviewed and interpreted. The left great saphenous vein is totally closed from the calf to the saphenofemoral junction with no DVT.     Assessment:     Successful bilateral laser ablation left great and right great saphenous veins for venous hypertension with history of severe skin changes bilaterally and ulcer right leg which is now healed    Plan:     We'll continue elevation of legs at night and shortly elastic compression on a long-term basis Return to see [place] on when necessary basis

## 2012-10-31 DIAGNOSIS — E1169 Type 2 diabetes mellitus with other specified complication: Secondary | ICD-10-CM | POA: Diagnosis not present

## 2012-10-31 DIAGNOSIS — L97509 Non-pressure chronic ulcer of other part of unspecified foot with unspecified severity: Secondary | ICD-10-CM | POA: Diagnosis not present

## 2012-10-31 DIAGNOSIS — G589 Mononeuropathy, unspecified: Secondary | ICD-10-CM | POA: Diagnosis not present

## 2012-10-31 DIAGNOSIS — L84 Corns and callosities: Secondary | ICD-10-CM | POA: Diagnosis not present

## 2012-11-07 ENCOUNTER — Other Ambulatory Visit: Payer: Self-pay

## 2012-11-07 DIAGNOSIS — E1169 Type 2 diabetes mellitus with other specified complication: Secondary | ICD-10-CM | POA: Diagnosis not present

## 2012-11-07 DIAGNOSIS — L84 Corns and callosities: Secondary | ICD-10-CM | POA: Diagnosis not present

## 2012-11-07 DIAGNOSIS — L97509 Non-pressure chronic ulcer of other part of unspecified foot with unspecified severity: Secondary | ICD-10-CM | POA: Diagnosis not present

## 2012-11-07 DIAGNOSIS — G589 Mononeuropathy, unspecified: Secondary | ICD-10-CM | POA: Diagnosis not present

## 2012-11-07 MED ORDER — HYDROCHLOROTHIAZIDE 25 MG PO TABS: ORAL_TABLET | ORAL | Status: DC

## 2012-11-14 DIAGNOSIS — L97509 Non-pressure chronic ulcer of other part of unspecified foot with unspecified severity: Secondary | ICD-10-CM | POA: Diagnosis not present

## 2012-11-14 DIAGNOSIS — E1149 Type 2 diabetes mellitus with other diabetic neurological complication: Secondary | ICD-10-CM | POA: Diagnosis not present

## 2012-11-14 DIAGNOSIS — Z125 Encounter for screening for malignant neoplasm of prostate: Secondary | ICD-10-CM | POA: Diagnosis not present

## 2012-11-14 DIAGNOSIS — E785 Hyperlipidemia, unspecified: Secondary | ICD-10-CM | POA: Diagnosis not present

## 2012-11-14 DIAGNOSIS — L84 Corns and callosities: Secondary | ICD-10-CM | POA: Diagnosis not present

## 2012-11-14 DIAGNOSIS — I10 Essential (primary) hypertension: Secondary | ICD-10-CM | POA: Diagnosis not present

## 2012-11-14 DIAGNOSIS — I2510 Atherosclerotic heart disease of native coronary artery without angina pectoris: Secondary | ICD-10-CM | POA: Diagnosis not present

## 2012-11-14 DIAGNOSIS — G589 Mononeuropathy, unspecified: Secondary | ICD-10-CM | POA: Diagnosis not present

## 2012-11-14 DIAGNOSIS — E1169 Type 2 diabetes mellitus with other specified complication: Secondary | ICD-10-CM | POA: Diagnosis not present

## 2012-11-24 ENCOUNTER — Encounter: Payer: Self-pay | Admitting: Internal Medicine

## 2012-11-24 DIAGNOSIS — E1149 Type 2 diabetes mellitus with other diabetic neurological complication: Secondary | ICD-10-CM | POA: Diagnosis not present

## 2012-11-24 DIAGNOSIS — L97509 Non-pressure chronic ulcer of other part of unspecified foot with unspecified severity: Secondary | ICD-10-CM | POA: Diagnosis not present

## 2012-11-24 DIAGNOSIS — Z0000 Encounter for general adult medical examination without abnormal findings: Secondary | ICD-10-CM | POA: Diagnosis not present

## 2012-11-24 DIAGNOSIS — I6350 Cerebral infarction due to unspecified occlusion or stenosis of unspecified cerebral artery: Secondary | ICD-10-CM | POA: Diagnosis not present

## 2012-11-24 DIAGNOSIS — Z125 Encounter for screening for malignant neoplasm of prostate: Secondary | ICD-10-CM | POA: Diagnosis not present

## 2012-11-24 DIAGNOSIS — I2589 Other forms of chronic ischemic heart disease: Secondary | ICD-10-CM | POA: Diagnosis not present

## 2012-11-24 DIAGNOSIS — E785 Hyperlipidemia, unspecified: Secondary | ICD-10-CM | POA: Diagnosis not present

## 2012-11-24 DIAGNOSIS — Z23 Encounter for immunization: Secondary | ICD-10-CM | POA: Diagnosis not present

## 2012-11-24 DIAGNOSIS — H5340 Unspecified visual field defects: Secondary | ICD-10-CM | POA: Diagnosis not present

## 2012-11-24 DIAGNOSIS — I2510 Atherosclerotic heart disease of native coronary artery without angina pectoris: Secondary | ICD-10-CM | POA: Diagnosis not present

## 2012-11-27 DIAGNOSIS — Z1212 Encounter for screening for malignant neoplasm of rectum: Secondary | ICD-10-CM | POA: Diagnosis not present

## 2012-12-01 ENCOUNTER — Other Ambulatory Visit: Payer: Self-pay | Admitting: Cardiology

## 2012-12-14 ENCOUNTER — Other Ambulatory Visit: Payer: Self-pay | Admitting: Cardiology

## 2012-12-18 DIAGNOSIS — A419 Sepsis, unspecified organism: Secondary | ICD-10-CM

## 2012-12-18 HISTORY — DX: Sepsis, unspecified organism: A41.9

## 2012-12-25 ENCOUNTER — Encounter: Payer: Self-pay | Admitting: Internal Medicine

## 2012-12-25 ENCOUNTER — Ambulatory Visit (INDEPENDENT_AMBULATORY_CARE_PROVIDER_SITE_OTHER): Payer: Medicare Other | Admitting: Internal Medicine

## 2012-12-25 VITALS — BP 120/70 | HR 71 | Ht 71.0 in | Wt 342.2 lb

## 2012-12-25 DIAGNOSIS — Z23 Encounter for immunization: Secondary | ICD-10-CM | POA: Diagnosis not present

## 2012-12-25 DIAGNOSIS — G4733 Obstructive sleep apnea (adult) (pediatric): Secondary | ICD-10-CM

## 2012-12-25 NOTE — Progress Notes (Signed)
Subjective:    Patient ID: Kerry Waters, male    DOB: 01/14/1946, 67 y.o.   MRN: [id number]  HPI 12/24/10- 67 year old male former smoker followed for obstructive sleep apnea complicated by obesity, HBP CAD/MI/ hx SVT. Last here May 08, 2010  Wearing right boot for diabetic ulcer/ getting hyperbaric treatments.  He continues CPAP all night every night 19 cwp/ [facility]. Compliance and effectiveness are great, with no complaints from home that he is snoring.  Denies lung concerns. Followed now by Dr Antoine Poche for cardiology.   12/24/11- 67 year old male former smoker followed for obstructive sleep apnea complicated by obesity, HBP CAD/MI/ hx SVT. Wears CPAP19/ [facility] every night for approximately 8 hours; pressure doing well for patient. Rhinitis-mild nasal congestion and some wheeze in the last week. Does not feel sick. He wants to wait on flu vaccine due to this.  12/25/12- 67 year old male former smoker followed for obstructive sleep apnea complicated by obesity, HBP CAD/MI/ hx SVT. FOLLOWS [id number] CPAP 19/ [facility] every night for 6-8 hours; pressure working well for patient. No acute concerns this visit. We discussed comfort and alternatives to CPAP  Review of Systems- HPI Constitutional:   No-   weight loss, night sweats, fevers, chills, fatigue, lassitude. HEENT:   No-  headaches, difficulty swallowing, tooth/dental problems, sore throat,       No-  sneezing, itching, ear ache, nasal congestion, post nasal drip,  CV:  No-   chest pain, orthopnea, PND, swelling in lower extremities, anasarca, dizziness, palpitations Resp: No-   shortness of breath with exertion or at rest.              No-   productive cough,  No non-productive cough,  No-  coughing up of blood.              No-   change in color of mucus.  No- wheezing.   Skin: No-   rash or lesions. GI:  No-   heartburn, indigestion, abdominal pain, nausea, vomiting,  GU:  MS:  No-   joint pain or swelling.   + Diabetic foot ulcers Neuro- Psych:  No- change in mood or affect. No depression or anxiety.  No memory loss. Objective:   Physical Exam General- Alert, Oriented, Affect-appropriate, Distress- none acute   Very obese Skin- rash-none, lesions- none, excoriation- none Lymphadenopathy- none Head- atraumatic            Eyes- Gross vision intact, PERRLA, conjunctivae clear secretions            Ears- Hearing, canals - normal             Nose- + pale mucosa, No- Septal dev, mucus, polyps, erosion, perforation             Throat- Mallampati IV , mucosa clear , drainage- none, tonsils- atrophic  Tongue coated Neck- flexible , trachea midline, no stridor , thyroid nl, carotid no bruit Chest - symmetrical excursion , unlabored           Heart/CV- RRR , no murmur , no gallop  , no rub, nl s1 s2                           - JVD- none , edema- 2+/ support hose, stasis changes- none, varices- none           Lung- clear to P&A, wheeze- none, cough- none , dullness-none, rub- none  Chest wall-  Abd-  Br/ Gen/ Rectal- Not done, not indicated Extrem- cyanosis- none, clubbing, none, atrophy- none, strength- nl   Neuro- grossly intact to observation    Assessment & Plan:

## 2012-12-25 NOTE — Patient Instructions (Addendum)
We can continue CPAP 19/ [facility]  Flu vax

## 2012-12-31 NOTE — Assessment & Plan Note (Signed)
Good compliance and control with CPAP. Pressure is appropriate

## 2013-01-05 ENCOUNTER — Other Ambulatory Visit: Payer: Self-pay | Admitting: Cardiology

## 2013-01-07 ENCOUNTER — Encounter (HOSPITAL_COMMUNITY): Payer: Self-pay | Admitting: Emergency Medicine

## 2013-01-07 ENCOUNTER — Inpatient Hospital Stay (HOSPITAL_COMMUNITY)
Admission: EM | Admit: 2013-01-07 | Discharge: 2013-01-13 | DRG: 871 | Disposition: A | Discharge status: Home Health | Payer: Medicare Other | Attending: Internal Medicine | Admitting: Internal Medicine

## 2013-01-07 ENCOUNTER — Emergency Department (HOSPITAL_COMMUNITY): Payer: Medicare Other

## 2013-01-07 DIAGNOSIS — E1149 Type 2 diabetes mellitus with other diabetic neurological complication: Secondary | ICD-10-CM | POA: Diagnosis not present

## 2013-01-07 DIAGNOSIS — S298XXA Other specified injuries of thorax, initial encounter: Secondary | ICD-10-CM | POA: Diagnosis not present

## 2013-01-07 DIAGNOSIS — Z6841 Body Mass Index (BMI) 40.0 and over, adult: Secondary | ICD-10-CM | POA: Diagnosis not present

## 2013-01-07 DIAGNOSIS — R404 Transient alteration of awareness: Secondary | ICD-10-CM | POA: Diagnosis not present

## 2013-01-07 DIAGNOSIS — I872 Venous insufficiency (chronic) (peripheral): Secondary | ICD-10-CM | POA: Diagnosis present

## 2013-01-07 DIAGNOSIS — L02619 Cutaneous abscess of unspecified foot: Secondary | ICD-10-CM | POA: Diagnosis present

## 2013-01-07 DIAGNOSIS — R079 Chest pain, unspecified: Secondary | ICD-10-CM | POA: Diagnosis not present

## 2013-01-07 DIAGNOSIS — I2510 Atherosclerotic heart disease of native coronary artery without angina pectoris: Secondary | ICD-10-CM | POA: Diagnosis present

## 2013-01-07 DIAGNOSIS — J9600 Acute respiratory failure, unspecified whether with hypoxia or hypercapnia: Secondary | ICD-10-CM | POA: Diagnosis not present

## 2013-01-07 DIAGNOSIS — I83219 Varicose veins of right lower extremity with both ulcer of unspecified site and inflammation: Secondary | ICD-10-CM | POA: Diagnosis not present

## 2013-01-07 DIAGNOSIS — L0390 Cellulitis, unspecified: Secondary | ICD-10-CM

## 2013-01-07 DIAGNOSIS — I214 Non-ST elevation (NSTEMI) myocardial infarction: Secondary | ICD-10-CM | POA: Diagnosis present

## 2013-01-07 DIAGNOSIS — G4733 Obstructive sleep apnea (adult) (pediatric): Secondary | ICD-10-CM | POA: Diagnosis present

## 2013-01-07 DIAGNOSIS — Z79899 Other long term (current) drug therapy: Secondary | ICD-10-CM | POA: Diagnosis not present

## 2013-01-07 DIAGNOSIS — I498 Other specified cardiac arrhythmias: Secondary | ICD-10-CM | POA: Diagnosis present

## 2013-01-07 DIAGNOSIS — G609 Hereditary and idiopathic neuropathy, unspecified: Secondary | ICD-10-CM | POA: Diagnosis present

## 2013-01-07 DIAGNOSIS — R739 Hyperglycemia, unspecified: Secondary | ICD-10-CM | POA: Diagnosis present

## 2013-01-07 DIAGNOSIS — E785 Hyperlipidemia, unspecified: Secondary | ICD-10-CM | POA: Diagnosis present

## 2013-01-07 DIAGNOSIS — I5023 Acute on chronic systolic (congestive) heart failure: Secondary | ICD-10-CM | POA: Diagnosis present

## 2013-01-07 DIAGNOSIS — N179 Acute kidney failure, unspecified: Secondary | ICD-10-CM | POA: Diagnosis not present

## 2013-01-07 DIAGNOSIS — E1159 Type 2 diabetes mellitus with other circulatory complications: Secondary | ICD-10-CM | POA: Diagnosis present

## 2013-01-07 DIAGNOSIS — Z7982 Long term (current) use of aspirin: Secondary | ICD-10-CM | POA: Diagnosis not present

## 2013-01-07 DIAGNOSIS — Z85828 Personal history of other malignant neoplasm of skin: Secondary | ICD-10-CM | POA: Diagnosis not present

## 2013-01-07 DIAGNOSIS — L97919 Non-pressure chronic ulcer of unspecified part of right lower leg with unspecified severity: Secondary | ICD-10-CM | POA: Diagnosis present

## 2013-01-07 DIAGNOSIS — I739 Peripheral vascular disease, unspecified: Secondary | ICD-10-CM | POA: Diagnosis not present

## 2013-01-07 DIAGNOSIS — L02818 Cutaneous abscess of other sites: Secondary | ICD-10-CM | POA: Diagnosis not present

## 2013-01-07 DIAGNOSIS — I252 Old myocardial infarction: Secondary | ICD-10-CM

## 2013-01-07 DIAGNOSIS — I471 Supraventricular tachycardia, unspecified: Secondary | ICD-10-CM | POA: Diagnosis present

## 2013-01-07 DIAGNOSIS — L0291 Cutaneous abscess, unspecified: Secondary | ICD-10-CM | POA: Diagnosis not present

## 2013-01-07 DIAGNOSIS — R7989 Other specified abnormal findings of blood chemistry: Secondary | ICD-10-CM | POA: Diagnosis present

## 2013-01-07 DIAGNOSIS — I2589 Other forms of chronic ischemic heart disease: Secondary | ICD-10-CM | POA: Diagnosis present

## 2013-01-07 DIAGNOSIS — F4489 Other dissociative and conversion disorders: Secondary | ICD-10-CM | POA: Diagnosis not present

## 2013-01-07 DIAGNOSIS — I10 Essential (primary) hypertension: Secondary | ICD-10-CM | POA: Diagnosis not present

## 2013-01-07 DIAGNOSIS — R748 Abnormal levels of other serum enzymes: Secondary | ICD-10-CM | POA: Diagnosis not present

## 2013-01-07 DIAGNOSIS — E871 Hypo-osmolality and hyponatremia: Secondary | ICD-10-CM | POA: Diagnosis present

## 2013-01-07 DIAGNOSIS — I517 Cardiomegaly: Secondary | ICD-10-CM | POA: Diagnosis not present

## 2013-01-07 DIAGNOSIS — I5022 Chronic systolic (congestive) heart failure: Secondary | ICD-10-CM | POA: Diagnosis present

## 2013-01-07 DIAGNOSIS — F29 Unspecified psychosis not due to a substance or known physiological condition: Secondary | ICD-10-CM | POA: Diagnosis not present

## 2013-01-07 DIAGNOSIS — L97509 Non-pressure chronic ulcer of other part of unspecified foot with unspecified severity: Secondary | ICD-10-CM | POA: Diagnosis not present

## 2013-01-07 DIAGNOSIS — IMO0002 Reserved for concepts with insufficient information to code with codable children: Secondary | ICD-10-CM | POA: Diagnosis not present

## 2013-01-07 DIAGNOSIS — L03039 Cellulitis of unspecified toe: Secondary | ICD-10-CM | POA: Diagnosis present

## 2013-01-07 DIAGNOSIS — I2699 Other pulmonary embolism without acute cor pulmonale: Secondary | ICD-10-CM | POA: Diagnosis not present

## 2013-01-07 DIAGNOSIS — E119 Type 2 diabetes mellitus without complications: Secondary | ICD-10-CM | POA: Diagnosis not present

## 2013-01-07 DIAGNOSIS — R5381 Other malaise: Secondary | ICD-10-CM | POA: Diagnosis not present

## 2013-01-07 DIAGNOSIS — I798 Other disorders of arteries, arterioles and capillaries in diseases classified elsewhere: Secondary | ICD-10-CM | POA: Diagnosis present

## 2013-01-07 DIAGNOSIS — R410 Disorientation, unspecified: Secondary | ICD-10-CM | POA: Diagnosis present

## 2013-01-07 DIAGNOSIS — E1142 Type 2 diabetes mellitus with diabetic polyneuropathy: Secondary | ICD-10-CM | POA: Diagnosis present

## 2013-01-07 DIAGNOSIS — Z87891 Personal history of nicotine dependence: Secondary | ICD-10-CM | POA: Diagnosis not present

## 2013-01-07 DIAGNOSIS — Z951 Presence of aortocoronary bypass graft: Secondary | ICD-10-CM | POA: Diagnosis not present

## 2013-01-07 DIAGNOSIS — L97909 Non-pressure chronic ulcer of unspecified part of unspecified lower leg with unspecified severity: Secondary | ICD-10-CM | POA: Diagnosis not present

## 2013-01-07 DIAGNOSIS — A419 Sepsis, unspecified organism: Secondary | ICD-10-CM | POA: Diagnosis not present

## 2013-01-07 DIAGNOSIS — M25559 Pain in unspecified hip: Secondary | ICD-10-CM | POA: Diagnosis not present

## 2013-01-07 DIAGNOSIS — R7309 Other abnormal glucose: Secondary | ICD-10-CM | POA: Diagnosis not present

## 2013-01-07 DIAGNOSIS — S0990XA Unspecified injury of head, initial encounter: Secondary | ICD-10-CM | POA: Diagnosis not present

## 2013-01-07 LAB — LACTIC ACID, PLASMA: Lactic Acid, Venous: 2.6 mmol/L — ABNORMAL HIGH (ref 0.5–2.2)

## 2013-01-07 LAB — CBC WITH DIFFERENTIAL/PLATELET
Basophils Absolute: 0 10*3/uL (ref 0.0–0.1)
HCT: 37.1 % — ABNORMAL LOW (ref 39.0–52.0)
Hemoglobin: 13.3 g/dL (ref 13.0–17.0)
Lymphocytes Relative: 5 % — ABNORMAL LOW (ref 12–46)
Monocytes Absolute: 0.5 10*3/uL (ref 0.1–1.0)
Monocytes Relative: 4 % (ref 3–12)
Neutro Abs: 13.3 10*3/uL — ABNORMAL HIGH (ref 1.7–7.7)
Platelets: 126 10*3/uL — ABNORMAL LOW (ref 150–400)
RDW: 14.8 % (ref 11.5–15.5)
WBC: 14.6 10*3/uL — ABNORMAL HIGH (ref 4.0–10.5)

## 2013-01-07 LAB — COMPREHENSIVE METABOLIC PANEL
ALT: 22 U/L (ref 0–53)
AST: 35 U/L (ref 0–37)
Alkaline Phosphatase: 58 U/L (ref 39–117)
BUN: 25 mg/dL — ABNORMAL HIGH (ref 6–23)
CO2: 20 mEq/L (ref 19–32)
Chloride: 93 mEq/L — ABNORMAL LOW (ref 96–112)
Creatinine, Ser: 1.39 mg/dL — ABNORMAL HIGH (ref 0.50–1.35)
GFR calc Af Amer: 59 mL/min — ABNORMAL LOW (ref >90)
GFR calc non Af Amer: 51 mL/min — ABNORMAL LOW (ref >90)
Total Bilirubin: 0.8 mg/dL (ref 0.3–1.2)

## 2013-01-07 LAB — URINALYSIS W MICROSCOPIC + REFLEX CULTURE
Bilirubin Urine: NEGATIVE
Glucose, UA: >1000 mg/dL — AB
Ketones, ur: 15 mg/dL — AB
pH: 5 (ref 5.0–8.0)

## 2013-01-07 LAB — PRO B NATRIURETIC PEPTIDE: Pro B Natriuretic peptide (BNP): 17379 pg/mL — ABNORMAL HIGH (ref 0–125)

## 2013-01-07 LAB — D-DIMER, QUANTITATIVE: D-Dimer, Quant: 2.66 ug/mL-FEU — ABNORMAL HIGH (ref 0.00–0.48)

## 2013-01-07 MED ORDER — ACETAMINOPHEN 325 MG PO TABS
650.0000 mg | ORAL_TABLET | Freq: Four times a day (QID) | ORAL | Status: DC | PRN
  Administered 2013-01-08 – 2013-01-13 (×8): 650 mg via ORAL
  Filled 2013-01-07 (×8): qty 2

## 2013-01-07 MED ORDER — ENOXAPARIN SODIUM 40 MG/0.4ML ~~LOC~~ SOLN: 40.0000 mg | SUBCUTANEOUS | Status: DC

## 2013-01-07 MED ORDER — HYDROCHLOROTHIAZIDE 25 MG PO TABS
25.0000 mg | ORAL_TABLET | Freq: Every day | ORAL | Status: DC
  Filled 2013-01-07: qty 1

## 2013-01-07 MED ORDER — ATORVASTATIN CALCIUM 80 MG PO TABS
80.0000 mg | ORAL_TABLET | Freq: Every day | ORAL | Status: DC
  Administered 2013-01-08 – 2013-01-13 (×6): 80 mg via ORAL
  Filled 2013-01-07 (×6): qty 1

## 2013-01-07 MED ORDER — SODIUM CHLORIDE 0.9 % IJ SOLN
3.0000 mL | Freq: Two times a day (BID) | INTRAMUSCULAR | Status: DC
  Administered 2013-01-08 – 2013-01-09 (×3): 3 mL via INTRAVENOUS

## 2013-01-07 MED ORDER — ACETAMINOPHEN 650 MG RE SUPP: 650.0000 mg | Freq: Four times a day (QID) | RECTAL | Status: DC | PRN

## 2013-01-07 MED ORDER — SACCHAROMYCES BOULARDII 250 MG PO CAPS
250.0000 mg | ORAL_CAPSULE | Freq: Two times a day (BID) | ORAL | Status: DC
Start: 2013-01-08 — End: 2013-01-13
  Administered 2013-01-08 – 2013-01-13 (×11): 250 mg via ORAL
  Filled 2013-01-07 (×13): qty 1

## 2013-01-07 MED ORDER — CLOPIDOGREL BISULFATE 75 MG PO TABS
75.0000 mg | ORAL_TABLET | Freq: Every day | ORAL | Status: DC
  Administered 2013-01-08 – 2013-01-13 (×6): 75 mg via ORAL
  Filled 2013-01-07 (×6): qty 1

## 2013-01-07 MED ORDER — SODIUM CHLORIDE 0.9 % IV BOLUS (SEPSIS)
1000.0000 mL | Freq: Once | INTRAVENOUS | Status: AC
  Administered 2013-01-07: 1000 mL via INTRAVENOUS

## 2013-01-07 MED ORDER — SPIRONOLACTONE 25 MG PO TABS
25.0000 mg | ORAL_TABLET | Freq: Every day | ORAL | Status: DC
  Administered 2013-01-08 – 2013-01-13 (×6): 25 mg via ORAL
  Filled 2013-01-07 (×6): qty 1

## 2013-01-07 MED ORDER — INSULIN ASPART 100 UNIT/ML ~~LOC~~ SOLN
3.0000 [IU] | Freq: Three times a day (TID) | SUBCUTANEOUS | Status: DC
  Administered 2013-01-08 – 2013-01-13 (×17): 3 [IU] via SUBCUTANEOUS

## 2013-01-07 MED ORDER — ADULT MULTIVITAMIN W/MINERALS CH
1.0000 | ORAL_TABLET | Freq: Every day | ORAL | Status: DC
  Administered 2013-01-08 – 2013-01-13 (×6): 1 via ORAL
  Filled 2013-01-07 (×6): qty 1

## 2013-01-07 MED ORDER — ENOXAPARIN SODIUM 150 MG/ML ~~LOC~~ SOLN
150.0000 mg | Freq: Two times a day (BID) | SUBCUTANEOUS | Status: DC
  Filled 2013-01-07: qty 1

## 2013-01-07 MED ORDER — LISINOPRIL 10 MG PO TABS
10.0000 mg | ORAL_TABLET | Freq: Two times a day (BID) | ORAL | Status: DC
  Administered 2013-01-08 – 2013-01-13 (×11): 10 mg via ORAL
  Filled 2013-01-07 (×15): qty 1

## 2013-01-07 MED ORDER — SODIUM CHLORIDE 0.9 % IV SOLN
INTRAVENOUS | Status: DC
  Administered 2013-01-08 – 2013-01-10 (×2): via INTRAVENOUS

## 2013-01-07 MED ORDER — VANCOMYCIN HCL 10 G IV SOLR
2000.0000 mg | INTRAVENOUS | Status: AC
  Administered 2013-01-07: 2000 mg via INTRAVENOUS
  Filled 2013-01-07: qty 2000

## 2013-01-07 MED ORDER — ASPIRIN EC 325 MG PO TBEC
325.0000 mg | DELAYED_RELEASE_TABLET | Freq: Every day | ORAL | Status: DC
  Administered 2013-01-08 – 2013-01-13 (×6): 325 mg via ORAL
  Filled 2013-01-07 (×6): qty 1

## 2013-01-07 MED ORDER — INSULIN DETEMIR 100 UNIT/ML ~~LOC~~ SOLN
45.0000 [IU] | Freq: Every day | SUBCUTANEOUS | Status: DC
  Administered 2013-01-08 – 2013-01-12 (×6): 45 [IU] via SUBCUTANEOUS
  Filled 2013-01-07 (×7): qty 0.45

## 2013-01-07 MED ORDER — INSULIN ASPART 100 UNIT/ML ~~LOC~~ SOLN
0.0000 [IU] | Freq: Every day | SUBCUTANEOUS | Status: DC
  Administered 2013-01-07 – 2013-01-09 (×3): 3 [IU] via SUBCUTANEOUS
  Administered 2013-01-10: 2 [IU] via SUBCUTANEOUS

## 2013-01-07 MED ORDER — DILTIAZEM HCL ER COATED BEADS 180 MG PO CP24
180.0000 mg | ORAL_CAPSULE | Freq: Two times a day (BID) | ORAL | Status: DC
  Administered 2013-01-08 – 2013-01-13 (×11): 180 mg via ORAL
  Filled 2013-01-07 (×14): qty 1

## 2013-01-07 MED ORDER — PIPERACILLIN-TAZOBACTAM 3.375 G IVPB 30 MIN
3.3750 g | INTRAVENOUS | Status: AC
  Administered 2013-01-07: 3.375 g via INTRAVENOUS
  Filled 2013-01-07: qty 50

## 2013-01-07 MED ORDER — LABETALOL HCL 200 MG PO TABS
200.0000 mg | ORAL_TABLET | Freq: Two times a day (BID) | ORAL | Status: DC
  Administered 2013-01-08 – 2013-01-13 (×11): 200 mg via ORAL
  Filled 2013-01-07 (×13): qty 1

## 2013-01-07 MED ORDER — INSULIN DETEMIR 100 UNIT/ML ~~LOC~~ SOLN
45.0000 [IU] | Freq: Every day | SUBCUTANEOUS | Status: DC
  Filled 2013-01-07: qty 0.45

## 2013-01-07 MED ORDER — INSULIN ASPART 100 UNIT/ML ~~LOC~~ SOLN
0.0000 [IU] | Freq: Three times a day (TID) | SUBCUTANEOUS | Status: DC
  Administered 2013-01-08 – 2013-01-09 (×6): 3 [IU] via SUBCUTANEOUS
  Administered 2013-01-10: 2 [IU] via SUBCUTANEOUS
  Administered 2013-01-10 (×2): 3 [IU] via SUBCUTANEOUS
  Administered 2013-01-11: 5 [IU] via SUBCUTANEOUS
  Administered 2013-01-11 – 2013-01-13 (×7): 3 [IU] via SUBCUTANEOUS

## 2013-01-07 MED ORDER — ASPIRIN 81 MG PO CHEW
324.0000 mg | CHEWABLE_TABLET | Freq: Once | ORAL | Status: AC
  Administered 2013-01-07: 324 mg via ORAL
  Filled 2013-01-07: qty 4

## 2013-01-07 NOTE — Consult Note (Addendum)
Cardiology Consult Note  Admit date: 01/07/2013 Name: Kerry Waters 67 y.o.  male DOB:  1945-06-21 MRN:  [id number]  Today's date:  01/07/2013  Referring Physician:  [facility]  Primary Physician:   Dr. Creola Corn  Reason for Consultation:    Abnormal troponin  IMPRESSIONS: 1. Elevated troponin in a patient with what appears to be impending or active sepsis, other differential possibilities would include demand ischemia, possibility of pulmonary embolus 2. History of coronary artery disease with bypass grafting in 1993 with no angina 3. Severe cellulitis and infected toe on the right leg 4. Acute respiratory distress 5. 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 chronic venous insufficiency 7. Hyperlipidemia 8. Previous stroke 9. Severe morbid obesity 10. Hypertension 11. Possible right heart or diastolic congestive heart failure with elevated BNP 12, Hyponatremia  RECOMMENDATION: 1. Obtain serial cardiac enzymes but go ahead and treat sepsis and possible cellulitis. 2. Consider full dose anticoagulation 3. Continue other cardiac medications 4. Check d dimer and consider evaluation for pulmonary embolus  HISTORY: This 67 year old male has a very complex past history. His cardiac history is remarkable for a previous anteroseptal infarction and he has previous bypass grafting in 1993. He had chest pain prior to bypass and has not had any since then. He has severe morbid obesity and also has history of chronic venous i.nsufficiency as well as peripheral vascular disease getting hyperbaric treatments are the infection on his foot. He had a stroke earlier last year and had some junctional bradycardia but no atrial fibrillation. He has had worsening shortness of breath and presented to the emergency room tonight with disorientation confusion and fever.  He evidently fell last night and had a head CT and a troponin returned that was abnormal and cardiology  was asked to evaluate him. He completely denies any chest pain but is having a lot of shortness of breath and tachypnea at the present time.   Past Medical History  Diagnosis Date  . Joint pain   . Ulcer of foot   . Ischemic heart disease   . MI (myocardial infarction) 1987    ANTERIOR SEPTAL  . Hypokinesis     MILD INFERIOR  . Hypertension   . Edema of lower extremity   . Obesities, morbid   . SVT (supraventricular tachycardia)   . Hx of CABG 1993  . Abnormal cardiovascular stress test 2008    EF 48%. No clear cut ischemia. Low risk scan  . Diabetes mellitus     Dr. Timothy Lasso follows.  [name] PVD (peripheral vascular disease)   . Venous stasis   . Peripheral neuropathy   . Hyperlipidemia   . Coronary artery disease   . Sleep apnea     CPAP  . History of angina   . Cancer     basil cell carcinoma  . Stroke     pt states possible stroke on 04/25/2012      Past Surgical History  Procedure Laterality Date  . Coronary artery bypass graft  1993    LIMA to LAD with patch angioplasty, SVG to OM, SVG to OM & PD and patch angioplasy to PDA  . Left ankle orif  2000     Allergies:  has No Known Allergies.   Medications: Prior to Admission medications   Medication Sig Start Date End Date Taking? Authorizing Provider  aspirin EC 325 MG tablet Take 325 mg by mouth daily.   Yes Historical Provider, MD  atorvastatin (LIPITOR) 80  MG tablet Take 80 mg by mouth daily.   Yes Historical Provider, MD  Canagliflozin (INVOKANA) 100 MG TABS Take 100 mg by mouth daily.    Yes Historical Provider, MD  clopidogrel (PLAVIX) 75 MG tablet Take 75 mg by mouth daily.   Yes Historical Provider, MD  Coenzyme Q10 300 MG CAPS Take 1 capsule by mouth daily.     Yes Historical Provider, MD  diltiazem (CARDIZEM CD) 180 MG 24 hr capsule Take 180 mg by mouth 2 (two) times daily.   Yes Historical Provider, MD  Exenatide (BYETTA 5 MCG PEN [place]) Inject 10 mg into the skin 2 (two) times daily.    Yes Historical  Provider, MD  glimepiride (AMARYL) 4 MG tablet Take 4 mg by mouth 2 (two) times daily. Take 1 in am and 1 in pm   Yes Historical Provider, MD  hydrochlorothiazide (HYDRODIURIL) 25 MG tablet Take 25 mg by mouth daily.   Yes Historical Provider, MD  ibuprofen (ADVIL,MOTRIN) 200 MG tablet Take 200 mg by mouth every 6 (six) hours as needed for pain.   Yes Historical Provider, MD  insulin detemir (LEVEMIR) 100 UNIT/ML injection Inject 45 Units into the skin at bedtime.   Yes Historical Provider, MD  labetalol (NORMODYNE) 200 MG tablet Take 200 mg by mouth 2 (two) times daily.    Yes Historical Provider, MD  lisinopril (PRINIVIL,ZESTRIL) 40 MG tablet Take 40 mg by mouth daily.   Yes Historical Provider, MD  metFORMIN (GLUCOPHAGE) 1000 MG tablet Take 1,000 mg by mouth 2 (two) times daily with a meal.   Yes Historical Provider, MD  Multiple Vitamin (MULTIVITAMIN WITH MINERALS) TABS tablet Take 1 tablet by mouth daily.   Yes Historical Provider, MD  Multiple Vitamin (MULTIVITAMIN) tablet Take 1 tablet by mouth daily.     Yes Historical Provider, MD  naproxen sodium (ANAPROX) 220 MG tablet Take 440 mg by mouth daily as needed (for pain).   Yes Historical Provider, MD  spironolactone (ALDACTONE) 25 MG tablet Take 25 mg by mouth daily.   Yes Historical Provider, MD  vitamin C (ASCORBIC ACID) 500 MG tablet Take 500 mg by mouth daily.   Yes Historical Provider, MD  Zinc 40 MG TABS Take 40 mg by mouth daily.    Yes Historical Provider, MD  cefdinir (OMNICEF) 300 MG capsule Take 300 mg by mouth 2 (two) times daily. Started 01/07/13, for 10 days, ending 01/16/13    Historical Provider, MD    Family History: Family Status  Relation Status Death Age  . Father Deceased 55    MI  . Mother Deceased 44  . Maternal Grandfather Deceased     MI  . Paternal Grandfather Deceased     MI    Social History:   reports that he quit smoking about 27 years ago. His smoking use included Cigarettes. He has a 30 pack-year  smoking history. He has never used smokeless tobacco. He reports that he does not drink alcohol or use illicit drugs.   History   Social History Narrative  . No narrative on file    Review of Systems: He is severely dyspneic with activity. He wears CPAP at night. He is complained of incontinence, urinary frequency over the past 2 weeks. He had a previous stroke but he was treated for in [facility] earlier this year. He has had significant chronic venous stasis and swelling of his lower extremities. He had fever earlier today. Other than as noted above the remainder  of the review of systems is unremarkable.  Physical Exam: BP 100/59  Pulse 74  Temp(Src) 99.1 F (37.3 C) (Oral)  Resp 20  SpO2 99%  General appearance: Extremely large white male who is tachypneic, wheezing and lying in bed in is a somewhat poor historian. Head: Normocephalic, without obvious abnormality, atraumatic Eyes: conjunctivae/corneas clear. PERRL, EOM's intact. Fundi not examined  Neck: Unable to assess JVD, very difficult to hear whether carotid bruits present or not Lungs: Wheezing bilaterally Heart: regular rate and rhythm, S1, S2 normal, no murmur, click, rub or gallop Abdomen: Quite large and soft without rebound Rectal: deferred Pulses: Femoral pulses are deep and difficult to feel, difficult to feel pedal pulses on feet Skin: Significant cellulitis present on the right leg, venous stasis changes on the left leg Extremities: There is an infected ulcer on the right great toe with erythema extending from the foot up to just below the knee, previous saphenous vein harvesting scars are noted. He also has venous stasis changes on the left with 1-2+ edema of both legs. Neurologic: He is oriented to the hospital but is a somewhat difficult historian.  Labs: CBC  Recent Labs  01/07/13 1940  WBC 14.6*  RBC 3.94*  HGB 13.3  HCT 37.1*  PLT 126*  MCV 94.2  MCH 33.8  MCHC 35.8  RDW 14.8  LYMPHSABS 0.7   MONOABS 0.5  EOSABS 0.0  BASOSABS 0.0   CMP   Recent Labs  01/07/13 1940  NA 129*  K 4.3  CL 93*  CO2 20  GLUCOSE 239*  BUN 25*  CREATININE 1.39*  CALCIUM 8.9  PROT 6.9  ALBUMIN 3.1*  AST 35  ALT 22  ALKPHOS 58  BILITOT 0.8  GFRNONAA 51*  GFRAA 59*   BNP (last 3 results)  Recent Labs  01/07/13 1940  PROBNP 17379.0*   Cardiac Panel (last 3 results)  Recent Labs  01/07/13 1940 01/07/13 2126  TROPONINI 2.32* 2.70*     Radiology: Clear lung fields  EKG: Nonspecific ST changes, old anteroseptal infarction  Signed:  W. Ashley Royalty MD [facility]   Cardiology Consultant  01/07/2013, 10:54 PM

## 2013-01-07 NOTE — ED Provider Notes (Signed)
Elevated Troponin  Added serial cardiac markers, reviewed.  The patient's EKG, which has minimal ST depressions in the anterolateral leads.  His BNP is greater than 17,000 and 5.  Call for cardiac consult.  Patient will be admitted to medicine for rule out  Arman Filter, NP 01/07/13 2109 I spoke with Dr. Viann Fish [facility] cardiology, who would be glad to consult on the patient  Arman Filter, NP 01/11/13 1958

## 2013-01-07 NOTE — ED Provider Notes (Signed)
CSN: [id number]     Arrival date & time 01/07/13  1725 History   First MD Initiated Contact with Patient 01/07/13 1730     No chief complaint on file.  (Consider location/radiation/quality/duration/timing/severity/associated sxs/prior Treatment) Patient is a 67 y.o. male presenting with altered mental status. The history is provided by the patient.  Altered Mental Status Presenting symptoms: behavior changes and disorientation   Severity:  Mild Most recent episode:  Today Episode history:  Continuous Timing:  Constant Progression:  Unchanged Chronicity:  New Context comment:  Fall last night Associated symptoms: no abdominal pain, no fever, no headaches, no nausea and no vomiting     Past Medical History  Diagnosis Date  . Joint pain   . Ulcer of foot   . Ischemic heart disease   . MI (myocardial infarction) 1987    ANTERIOR SEPTAL  . Hypokinesis     MILD INFERIOR  . Hypertension   . Edema of lower extremity   . Obesities, morbid   . SVT (supraventricular tachycardia)   . Hx of CABG 1993  . Abnormal cardiovascular stress test 2008    EF 48%. No clear cut ischemia. Low risk scan  . Diabetes mellitus     Dr. Timothy Lasso follows.  [name] PVD (peripheral vascular disease)   . Venous stasis   . Peripheral neuropathy   . Hyperlipidemia   . Coronary artery disease   . Sleep apnea     CPAP  . History of angina   . Cancer     basil cell carcinoma  . Stroke     pt states possible stroke on 04/25/2012   Past Surgical History  Procedure Laterality Date  . Coronary artery bypass graft  1993    LIMA to LAD with patch angioplasty, SVG to OM, SVG to OM & PD and patch angioplasy to PDA  . Left ankle orif  2000   Family History  Problem Relation Age of Onset  . Heart attack Father   . Heart disease Father     before age 13  . Cancer Mother     stomach cancer  . Heart attack Maternal Grandfather   . Heart attack Paternal Grandfather   . Cancer Sister     breast  . Diabetes Son     History  Substance Use Topics  . Smoking status: Former Smoker -- 1.00 packs/day for 30 years    Types: Cigarettes    Quit date: 04/19/1985  . Smokeless tobacco: Never Used  . Alcohol Use: No    Review of Systems  Constitutional: Negative for fever.  HENT: Negative for rhinorrhea, drooling and neck pain.   Eyes: Negative for pain.  Respiratory: Negative for cough and shortness of breath.   Cardiovascular: Negative for chest pain and leg swelling.  Gastrointestinal: Negative for nausea, vomiting, abdominal pain and diarrhea.  Genitourinary: Negative for dysuria and hematuria.  Musculoskeletal: Negative for gait problem.  Skin: Negative for color change.  Neurological: Negative for numbness and headaches.       Confusion  Hematological: Negative for adenopathy.  Psychiatric/Behavioral: Negative for behavioral problems.  All other systems reviewed and are negative.    Allergies  Review of patient's allergies indicates no known allergies.  Home Medications   Current Outpatient Rx  Name  Route  Sig  Dispense  Refill  . atorvastatin (LIPITOR) 80 MG tablet   Oral   Take 80 mg by mouth daily.         . cefdinir (OMNICEF)  300 MG capsule   Oral   Take 300 mg by mouth 2 (two) times daily. Started 01/07/13, for 10 days, ending 01/16/13         . diltiazem (TIAZAC) 180 MG 24 hr capsule   Oral   Take 180 mg by mouth 2 (two) times daily.         . hydrochlorothiazide (HYDRODIURIL) 25 MG tablet   Oral   Take 25 mg by mouth daily.         . insulin detemir (LEVEMIR) 100 UNIT/ML injection   Subcutaneous   Inject 45 Units into the skin at bedtime.         [name] labetalol (NORMODYNE) 200 MG tablet   Oral   Take 400 mg by mouth 3 (three) times daily.         [name] lisinopril (PRINIVIL,ZESTRIL) 40 MG tablet   Oral   Take 40 mg by mouth daily.         . metFORMIN (GLUCOPHAGE) 1000 MG tablet   Oral   Take 1,000 mg by mouth 2 (two) times daily with a meal.         .  spironolactone (ALDACTONE) 25 MG tablet   Oral   Take 25 mg by mouth daily.         [name] aspirin 325 MG tablet   Oral   Take 325 mg by mouth daily.          . Canagliflozin (INVOKANA) 100 MG TABS   Oral   Take by mouth.         . Coenzyme Q10 300 MG CAPS   Oral   Take 1 capsule by mouth daily.           . Exenatide (BYETTA 5 MCG PEN [place])   Subcutaneous   Inject 10 mg into the skin 2 (two) times daily.          [name] glimepiride (AMARYL) 4 MG tablet      4 mg 2 (two) times daily. Take 1 in am and 1 in pm         . Multiple Vitamin (MULTIVITAMIN) tablet   Oral   Take 1 tablet by mouth daily.           . vitamin C (ASCORBIC ACID) 500 MG tablet   Oral   Take 500 mg by mouth daily.         . Zinc 40 MG TABS   Oral   Take by mouth.          There were no vitals taken for this visit. Physical Exam  Nursing note and vitals reviewed. Constitutional: He is oriented to person, place, and time. He appears well-developed and well-nourished.  HENT:  Head: Normocephalic and atraumatic.  Right Ear: External ear normal.  Left Ear: External ear normal.  Nose: Nose normal.  Mouth/Throat: Oropharynx is clear and moist. No oropharyngeal exudate.  Eyes: Conjunctivae and EOM are normal. Pupils are equal, round, and reactive to light.  Neck: Normal range of motion. Neck supple.  Cardiovascular: Normal rate, regular rhythm, normal heart sounds and intact distal pulses.  Exam reveals no gallop and no friction rub.   No murmur heard. Pulmonary/Chest: Breath sounds normal. No respiratory distress. He has no wheezes.  Mild tachypnea.   Abdominal: Soft. Bowel sounds are normal. He exhibits no distension. There is no tenderness. There is no rebound and no guarding.  Musculoskeletal: Normal range of motion. He exhibits no edema and no  tenderness.  Mild abrasion to left lateral arm.   Erythema diffusely of right lower leg extending from knee to foot. Chronic ulcer to medial surface  of right foot, first digit.   Neurological: He is alert and oriented to person, place, and time.  Skin: Skin is warm and dry.  Psychiatric: He has a normal mood and affect. His behavior is normal.    ED Course  Procedures (including critical care time) Labs Review Labs Reviewed  CBC WITH DIFFERENTIAL - Abnormal; Notable for the following:    WBC 14.6 (*)    RBC 3.94 (*)    HCT 37.1 (*)    Platelets 126 (*)    Neutrophils Relative % 91 (*)    Neutro Abs 13.3 (*)    Lymphocytes Relative 5 (*)    All other components within normal limits  COMPREHENSIVE METABOLIC PANEL - Abnormal; Notable for the following:    Sodium 129 (*)    Chloride 93 (*)    Glucose, Bld 239 (*)    BUN 25 (*)    Creatinine, Ser 1.39 (*)    Albumin 3.1 (*)    GFR calc non Af Amer 51 (*)    GFR calc Af Amer 59 (*)    All other components within normal limits  TROPONIN I - Abnormal; Notable for the following:    Troponin I 2.32 (*)    All other components within normal limits  PRO B NATRIURETIC PEPTIDE - Abnormal; Notable for the following:    Pro B Natriuretic peptide (BNP) 17379.0 (*)    All other components within normal limits  LACTIC ACID, PLASMA - Abnormal; Notable for the following:    Lactic Acid, Venous 2.6 (*)    All other components within normal limits  CULTURE, BLOOD (ROUTINE X 2)  CULTURE, BLOOD (ROUTINE X 2)  URINALYSIS W MICROSCOPIC + REFLEX CULTURE  BLOOD GAS, ARTERIAL  TROPONIN I  TROPONIN I   Imaging Review Dg Chest 2 View  01/07/2013   CLINICAL DATA:  Chest pain status post fall. History of hypertension and diabetes.  EXAM: CHEST  2 VIEW  COMPARISON:  06/02/2010.  FINDINGS: The heart size and mediastinal contours are stable allowing for lordotic positioning. Patient is status post CABG. The upper sternotomy wires are fractured. The lungs are clear. There is no pleural effusion or pneumothorax. Telemetry leads overlie the chest. Mild thoracic spine degenerative changes are stable.   IMPRESSION: Stable postoperative chest. No acute cardiopulmonary process.   Electronically Signed   By: Roxy Horseman   On: 01/07/2013 19:24   Dg Pelvis 1-2 Views  01/07/2013   CLINICAL DATA:  Joint pain status post fall.  EXAM: PELVIS - 1-2 VIEW  COMPARISON:  None.  FINDINGS: The mineralization and alignment are normal. There is no evidence of acute fracture or dislocation. The hip joint spaces are preserved. Scattered vascular calcifications are noted.  IMPRESSION: No acute osseous findings.   Electronically Signed   By: Roxy Horseman   On: 01/07/2013 19:19   Ct Head Wo Contrast  01/07/2013   CLINICAL DATA:  Confusion; recent trauma; fever  EXAM: CT HEAD WITHOUT CONTRAST  TECHNIQUE: Contiguous axial images were obtained from the base of the skull through the vertex without intravenous contrast. Study was obtained within 24 hr of patient's arrival at the emergency department.  COMPARISON:  May 23, 2012  FINDINGS: Ventricles are normal in size and configuration. There is no mass, hemorrhage, extra-axial fluid collection, or midline shift. There is  evidence of old infarct in the medial right occipital lobe. Elsewhere, gray-white compartments are normal. There is no demonstrable acute infarct.  Bony calvarium appears intact. The mastoid air cells are clear.  IMPRESSION: Remote medial aspect right occipital lobe infarct. Study otherwise unremarkable. In particular, there is no appreciable mass, hemorrhage, or acute appearing infarct.   Electronically Signed   By: Bretta Bang   On: 01/07/2013 19:26     Date: 01/07/2013  Rate: 64  Rhythm: normal sinus rhythm  QRS Axis: normal  Intervals: borderline prolgoned PR, QTc of 445 ms  ST/T Wave abnormalities: Non-specific ST changes  Conduction Disutrbances:none  Narrative Interpretation: Minimal non-specific ST depression in anterolateral leads  Old EKG Reviewed: changes noted   MDM   1. Cellulitis   2. Sepsis   3. Elevated troponin   4.  Hyponatremia   5. Hyperglycemia   6. Elevated brain natriuretic peptide (BNP) level   7. Elevated lactic acid level   8. Confusion    5:44 PM 67 y.o. male who presents with mild confusion and instability with walking which was noticed this morning. The family notes that the patient fell last night while getting up to go to the bathroom. He denies hitting his head and states that this was a mechanical fall. The patient is alert and oriented x3 here. He has no complaints on exam. Will perform infectious workup and get CT of his head.   Imaging shows no acute findings. Awaiting labs. Will start vanc/zosyn empirically for cellulitis of RLE.   8:30 PM Transferred care to Earley Favor NP.   Junius Argyle, MD 01/07/13 2208

## 2013-01-07 NOTE — ED Notes (Signed)
Let pt know that we needed a urine sample and gave him a urinal.

## 2013-01-07 NOTE — ED Notes (Addendum)
Patient from home, fell last night since then per family patient has had trouble walking and periods of confusion.  Patient has had discussion with PCP and advised patient to be seen for possible UTI, denies frequent urination alert and oriented x4. Per EMS patient is having gait issues, EMS had to come out to home to help patient get out of the restroom.

## 2013-01-07 NOTE — H&P (Addendum)
Kerry Waters is an 67 y.o. male.   Chief Complaint: felt bad HPI: Kerry Waters is a 67 yo man with pmhx as below. He has felt bad for the last 5 days.  He may have had a fever in the last day or so. He may have also had some confusion. He denies cp.  He has had a normal appetite.   In the ER he is found to have significant RLE acute cellulitis. He also has an elevated wbc as well as possible ecg changes and abnL trop I and bnp.   He will require admission. 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 He has not felt driving.  No pain right now.  He fell last nigh possibly although the story is not clear on this as he denies it today.  Past Medical History  Diagnosis Date  . Joint pain   . Ulcer of foot   . Ischemic heart disease   . MI (myocardial infarction) 1987    ANTERIOR SEPTAL  . Hypokinesis     MILD INFERIOR  . Hypertension   . Edema of lower extremity   . Obesities, morbid   . SVT (supraventricular tachycardia)   . Hx of CABG 1993  . Abnormal cardiovascular stress test 2008    EF 48%. No clear cut ischemia. Low risk scan  . Diabetes mellitus     Dr. Timothy Lasso follows.  [name] PVD (peripheral vascular disease)   . Venous stasis   . Peripheral neuropathy   . Hyperlipidemia   . Coronary artery disease   . Sleep apnea     CPAP  . History of angina   . Cancer     basil cell carcinoma  . Stroke     pt states possible stroke on 04/25/2012    Past Surgical History  Procedure Laterality Date  . Coronary artery bypass graft  1993    LIMA to LAD with patch angioplasty, SVG to OM, SVG to OM & PD and patch angioplasy to PDA  . Left ankle orif  2000    Family History  Problem Relation Age of Onset  . Heart attack Father   . Heart disease Father     before age 81  . Cancer Mother     stomach cancer  . Heart attack Maternal Grandfather   . Heart attack Paternal Grandfather   . Cancer Sister     breast  . Diabetes Son    Social History:  reports that he quit smoking  about 27 years ago. His smoking use included Cigarettes. He has a 30 pack-year smoking history. He has never used smokeless tobacco. He reports that he does not drink alcohol or use illicit drugs.  Wife Nelva Bush,  Daughter Noreene Larsson are with him.  Allergies: No Known Allergies  Meds:  Aspirin 325 mg daily invokana 100 mg daily levemir 45 units each evening Spironolactone 25 mg daily Glucosamine sometimes Metformin 1000 mg po bid Lisinopril 40 mg daily lipitor 80 mg daily hctz 25 mg daily amaryl 4 mg po bid Diltiazem xr 180 mg daily coq10 100 mg daily mvi daily Labetalol 200 mg po bid byetta 10 mcg sq bid    Results for orders placed during the hospital encounter of 01/07/13 (from the past 48 hour(s))  CBC WITH DIFFERENTIAL     Status: Abnormal   Collection Time    01/07/13  7:40 PM      Result Value Range   WBC  14.6 (*) 4.0 - 10.5 K/uL   RBC 3.94 (*) 4.22 - 5.81 MIL/uL   Hemoglobin 13.3  13.0 - 17.0 g/dL   HCT 16.1 (*) 09.6 - 04.5 %   MCV 94.2  78.0 - 100.0 fL   MCH 33.8  26.0 - 34.0 pg   MCHC 35.8  30.0 - 36.0 g/dL   RDW 40.9  81.1 - 91.4 %   Platelets 126 (*) 150 - 400 K/uL   Neutrophils Relative % 91 (*) 43 - 77 %   Neutro Abs 13.3 (*) 1.7 - 7.7 K/uL   Lymphocytes Relative 5 (*) 12 - 46 %   Lymphs Abs 0.7  0.7 - 4.0 K/uL   Monocytes Relative 4  3 - 12 %   Monocytes Absolute 0.5  0.1 - 1.0 K/uL   Eosinophils Relative 0  0 - 5 %   Eosinophils Absolute 0.0  0.0 - 0.7 K/uL   Basophils Relative 0  0 - 1 %   Basophils Absolute 0.0  0.0 - 0.1 K/uL  COMPREHENSIVE METABOLIC PANEL     Status: Abnormal   Collection Time    01/07/13  7:40 PM      Result Value Range   Sodium 129 (*) 135 - 145 mEq/L   Potassium 4.3  3.5 - 5.1 mEq/L   Chloride 93 (*) 96 - 112 mEq/L   CO2 20  19 - 32 mEq/L   Glucose, Bld 239 (*) 70 - 99 mg/dL   BUN 25 (*) 6 - 23 mg/dL   Creatinine, Ser 7.82 (*) 0.50 - 1.35 mg/dL   Calcium 8.9  8.4 - 95.6 mg/dL   Total Protein 6.9  6.0 - 8.3 g/dL   Albumin 3.1  (*) 3.5 - 5.2 g/dL   AST 35  0 - 37 U/L   ALT 22  0 - 53 U/L   Alkaline Phosphatase 58  39 - 117 U/L   Total Bilirubin 0.8  0.3 - 1.2 mg/dL   GFR calc non Af Amer 51 (*) >90 mL/min   GFR calc Af Amer 59 (*) >90 mL/min   Comment: (NOTE)     The eGFR has been calculated using the CKD EPI equation.     This calculation has not been validated in all clinical situations.     eGFR's persistently <90 mL/min signify possible Chronic Kidney     Disease.  TROPONIN I     Status: Abnormal   Collection Time    01/07/13  7:40 PM      Result Value Range   Troponin I 2.32 (*) <0.30 ng/mL   Comment:            Due to the release kinetics of cTnI,     a negative result within the first hours     of the onset of symptoms does not rule out     myocardial infarction with certainty.     If myocardial infarction is still suspected,     repeat the test at appropriate intervals.     REPEATED TO VERIFY     CRITICAL RESULT CALLED TO, READ BACK BY AND VERIFIED WITH:     GRANT,G RN 01/07/13 2055 WOOTEN,K  PRO B NATRIURETIC PEPTIDE     Status: Abnormal   Collection Time    01/07/13  7:40 PM      Result Value Range   Pro B Natriuretic peptide (BNP) 17379.0 (*) 0 - 125 pg/mL  LACTIC ACID, PLASMA  Status: Abnormal   Collection Time    01/07/13  7:40 PM      Result Value Range   Lactic Acid, Venous 2.6 (*) 0.5 - 2.2 mmol/L  URINALYSIS W MICROSCOPIC + REFLEX CULTURE     Status: Abnormal   Collection Time    01/07/13  8:49 PM      Result Value Range   Color, Urine YELLOW  YELLOW   APPearance CLEAR  CLEAR   Specific Gravity, Urine 1.027  1.005 - 1.030   pH 5.0  5.0 - 8.0   Glucose, UA >1000 (*) NEGATIVE mg/dL   Hgb urine dipstick SMALL (*) NEGATIVE   Bilirubin Urine NEGATIVE  NEGATIVE   Ketones, ur 15 (*) NEGATIVE mg/dL   Protein, ur 30 (*) NEGATIVE mg/dL   Urobilinogen, UA 0.2  0.0 - 1.0 mg/dL   Nitrite NEGATIVE  NEGATIVE   Leukocytes, UA NEGATIVE  NEGATIVE   WBC, UA 0-2  <3 WBC/hpf   RBC / HPF  0-2  <3 RBC/hpf   Squamous Epithelial / LPF FEW (*) RARE   Dg Chest 2 View  01/07/2013   CLINICAL DATA:  Chest pain status post fall. History of hypertension and diabetes.  EXAM: CHEST  2 VIEW  COMPARISON:  06/02/2010.  FINDINGS: The heart size and mediastinal contours are stable allowing for lordotic positioning. Patient is status post CABG. The upper sternotomy wires are fractured. The lungs are clear. There is no pleural effusion or pneumothorax. Telemetry leads overlie the chest. Mild thoracic spine degenerative changes are stable.  IMPRESSION: Stable postoperative chest. No acute cardiopulmonary process.   Electronically Signed   By: Roxy Horseman   On: 01/07/2013 19:24   Dg Pelvis 1-2 Views  01/07/2013   CLINICAL DATA:  Joint pain status post fall.  EXAM: PELVIS - 1-2 VIEW  COMPARISON:  None.  FINDINGS: The mineralization and alignment are normal. There is no evidence of acute fracture or dislocation. The hip joint spaces are preserved. Scattered vascular calcifications are noted.  IMPRESSION: No acute osseous findings.   Electronically Signed   By: Roxy Horseman   On: 01/07/2013 19:19   Ct Head Wo Contrast  01/07/2013   CLINICAL DATA:  Confusion; recent trauma; fever  EXAM: CT HEAD WITHOUT CONTRAST  TECHNIQUE: Contiguous axial images were obtained from the base of the skull through the vertex without intravenous contrast. Study was obtained within 24 hr of patient's arrival at the emergency department.  COMPARISON:  May 23, 2012  FINDINGS: Ventricles are normal in size and configuration. There is no mass, hemorrhage, extra-axial fluid collection, or midline shift. There is evidence of old infarct in the medial right occipital lobe. Elsewhere, gray-white compartments are normal. There is no demonstrable acute infarct.  Bony calvarium appears intact. The mastoid air cells are clear.  IMPRESSION: Remote medial aspect right occipital lobe infarct. Study otherwise unremarkable. In particular, there is  no appreciable mass, hemorrhage, or acute appearing infarct.   Electronically Signed   By: Bretta Bang   On: 01/07/2013 19:26    ROS:as per hpi  Blood pressure 100/59, pulse 74, temperature 99.1 F (37.3 C), temperature source Oral, resp. rate 20, SpO2 99.00%.  morbidly obese gentleman with increased work of breathing and pursed lip breathing. he has some upper airway wheeze sounds.  no jvd noted.  lungs reveal bronchial bs bilaterally but w/r/r. Heart is rrr with no m/r/g. abd, obese, soft, nt, nd, no mass or hsm.  legs reveal 1+ bilat edema. He  has acute cellulitis changes to right leg to just below the knee.  He has chronic venous stasis changes to his left leg.  He has an infected great toe ulceration with purulent material coming from it.  Assessment/Plan Seriously Ill male with acute cellulitis and infected right great toe (this is concerning that this may need surgery) as well as elevated troponin, elevated bnp and abnl ecg.  His increased work of breathing is concerning for impending sepsis syndrome.  Admit to stepdown bed.  Check abg and lactic acid level. Treat with iv vanco and zosyn. Cardiology consult is pending for the cardiac questions here.  He is a full code status.   Ezequiel Kayser, MD 01/07/2013, 10:11 PM

## 2013-01-08 ENCOUNTER — Encounter (HOSPITAL_COMMUNITY): Payer: Self-pay | Admitting: Internal Medicine

## 2013-01-08 ENCOUNTER — Inpatient Hospital Stay (HOSPITAL_COMMUNITY): Payer: Medicare Other

## 2013-01-08 DIAGNOSIS — I2699 Other pulmonary embolism without acute cor pulmonale: Secondary | ICD-10-CM

## 2013-01-08 DIAGNOSIS — R7989 Other specified abnormal findings of blood chemistry: Secondary | ICD-10-CM

## 2013-01-08 DIAGNOSIS — L0291 Cutaneous abscess, unspecified: Secondary | ICD-10-CM

## 2013-01-08 DIAGNOSIS — I2510 Atherosclerotic heart disease of native coronary artery without angina pectoris: Secondary | ICD-10-CM

## 2013-01-08 DIAGNOSIS — I517 Cardiomegaly: Secondary | ICD-10-CM

## 2013-01-08 DIAGNOSIS — F29 Unspecified psychosis not due to a substance or known physiological condition: Secondary | ICD-10-CM

## 2013-01-08 DIAGNOSIS — L0390 Cellulitis, unspecified: Secondary | ICD-10-CM

## 2013-01-08 DIAGNOSIS — A419 Sepsis, unspecified organism: Principal | ICD-10-CM

## 2013-01-08 LAB — CBC WITH DIFFERENTIAL/PLATELET
Basophils Absolute: 0 10*3/uL (ref 0.0–0.1)
Eosinophils Absolute: 0 10*3/uL (ref 0.0–0.7)
Eosinophils Relative: 0 % (ref 0–5)
HCT: 36.2 % — ABNORMAL LOW (ref 39.0–52.0)
Hemoglobin: 12.8 g/dL — ABNORMAL LOW (ref 13.0–17.0)
Lymphocytes Relative: 6 % — ABNORMAL LOW (ref 12–46)
Lymphs Abs: 1 10*3/uL (ref 0.7–4.0)
MCH: 33.4 pg (ref 26.0–34.0)
Monocytes Absolute: 0.7 10*3/uL (ref 0.1–1.0)
Monocytes Relative: 5 % (ref 3–12)
Neutro Abs: 13.4 10*3/uL — ABNORMAL HIGH (ref 1.7–7.7)
Neutrophils Relative %: 89 % — ABNORMAL HIGH (ref 43–77)
RBC: 3.83 MIL/uL — ABNORMAL LOW (ref 4.22–5.81)

## 2013-01-08 LAB — GLUCOSE, CAPILLARY
Glucose-Capillary: 193 mg/dL — ABNORMAL HIGH (ref 70–99)
Glucose-Capillary: 196 mg/dL — ABNORMAL HIGH (ref 70–99)
Glucose-Capillary: 167 mg/dL — ABNORMAL HIGH (ref 70–99)
Glucose-Capillary: 265 mg/dL — ABNORMAL HIGH (ref 70–99)

## 2013-01-08 LAB — COMPREHENSIVE METABOLIC PANEL
AST: 43 U/L — ABNORMAL HIGH (ref 0–37)
BUN: 26 mg/dL — ABNORMAL HIGH (ref 6–23)
CO2: 23 mEq/L (ref 19–32)
Calcium: 8.8 mg/dL (ref 8.4–10.5)
Creatinine, Ser: 1.4 mg/dL — ABNORMAL HIGH (ref 0.50–1.35)
GFR calc Af Amer: 59 mL/min — ABNORMAL LOW (ref >90)
GFR calc non Af Amer: 50 mL/min — ABNORMAL LOW (ref >90)
Glucose, Bld: 218 mg/dL — ABNORMAL HIGH (ref 70–99)
Sodium: 130 mEq/L — ABNORMAL LOW (ref 135–145)
Total Bilirubin: 0.7 mg/dL (ref 0.3–1.2)
Total Protein: 6.8 g/dL (ref 6.0–8.3)

## 2013-01-08 LAB — MRSA PCR SCREENING: MRSA by PCR: POSITIVE — AB

## 2013-01-08 LAB — TROPONIN I: Troponin I: 2.57 ng/mL (ref <0.30)

## 2013-01-08 MED ORDER — MUPIROCIN 2 % EX OINT
1.0000 "application " | TOPICAL_OINTMENT | Freq: Two times a day (BID) | CUTANEOUS | Status: AC
  Administered 2013-01-08 – 2013-01-12 (×10): 1 via NASAL
  Filled 2013-01-08 (×2): qty 22

## 2013-01-08 MED ORDER — PERFLUTREN LIPID MICROSPHERE
1.0000 mL | INTRAVENOUS | Status: AC | PRN
  Administered 2013-01-08: 2 mL via INTRAVENOUS
  Filled 2013-01-08: qty 10

## 2013-01-08 MED ORDER — ENOXAPARIN SODIUM 150 MG/ML ~~LOC~~ SOLN
150.0000 mg | Freq: Two times a day (BID) | SUBCUTANEOUS | Status: DC
  Administered 2013-01-08 – 2013-01-09 (×5): 150 mg via SUBCUTANEOUS
  Filled 2013-01-08 (×7): qty 1

## 2013-01-08 MED ORDER — CHLORHEXIDINE GLUCONATE CLOTH 2 % EX PADS
6.0000 | MEDICATED_PAD | Freq: Every day | CUTANEOUS | Status: AC
  Administered 2013-01-08 – 2013-01-12 (×5): 6 via TOPICAL

## 2013-01-08 MED ORDER — PIPERACILLIN-TAZOBACTAM 3.375 G IVPB
3.3750 g | Freq: Three times a day (TID) | INTRAVENOUS | Status: AC
  Administered 2013-01-08 – 2013-01-12 (×13): 3.375 g via INTRAVENOUS
  Filled 2013-01-08 (×15): qty 50

## 2013-01-08 MED ORDER — VANCOMYCIN HCL 10 G IV SOLR
1500.0000 mg | Freq: Two times a day (BID) | INTRAVENOUS | Status: AC
  Administered 2013-01-08 – 2013-01-12 (×8): 1500 mg via INTRAVENOUS
  Filled 2013-01-08 (×10): qty 1500

## 2013-01-08 NOTE — Progress Notes (Signed)
SUBJECTIVE:  He denies chest pain or SOB.    PHYSICAL EXAM Filed Vitals:   01/07/13 2338 01/08/13 0111 01/08/13 0327 01/08/13 0719  BP: 119/58  136/80 146/88  Pulse: 87 88 101   Temp: 98.6 F (37 C)  98.2 F (36.8 C) 102.1 F (38.9 C)  TempSrc: Oral  Oral Axillary  Resp: 27 28 30    Height: 5\' 11"  (1.803 m)     Weight: 335 lb 12.2 oz (152.3 kg)  333 lb 12.4 oz (151.4 kg)   SpO2: 95% 97% 100%    General:  No distress Lungs:  Few expiratory wheezes Heart:  RRR Abdomen:  Positive bowel sounds, no rebound no guarding Extremities:  Right greater than left leg edema.    LABS: Lab Results  Component Value Date   TROPONINI 2.57* 01/08/2013   Results for orders placed during the hospital encounter of 01/07/13 (from the past 24 hour(s))  CBC WITH DIFFERENTIAL     Status: Abnormal   Collection Time    01/07/13  7:40 PM      Result Value Range   WBC 14.6 (*) 4.0 - 10.5 K/uL   RBC 3.94 (*) 4.22 - 5.81 MIL/uL   Hemoglobin 13.3  13.0 - 17.0 g/dL   HCT 91.4 (*) 78.2 - 95.6 %   MCV 94.2  78.0 - 100.0 fL   MCH 33.8  26.0 - 34.0 pg   MCHC 35.8  30.0 - 36.0 g/dL   RDW 21.3  08.6 - 57.8 %   Platelets 126 (*) 150 - 400 K/uL   Neutrophils Relative % 91 (*) 43 - 77 %   Neutro Abs 13.3 (*) 1.7 - 7.7 K/uL   Lymphocytes Relative 5 (*) 12 - 46 %   Lymphs Abs 0.7  0.7 - 4.0 K/uL   Monocytes Relative 4  3 - 12 %   Monocytes Absolute 0.5  0.1 - 1.0 K/uL   Eosinophils Relative 0  0 - 5 %   Eosinophils Absolute 0.0  0.0 - 0.7 K/uL   Basophils Relative 0  0 - 1 %   Basophils Absolute 0.0  0.0 - 0.1 K/uL  COMPREHENSIVE METABOLIC PANEL     Status: Abnormal   Collection Time    01/07/13  7:40 PM      Result Value Range   Sodium 129 (*) 135 - 145 mEq/L   Potassium 4.3  3.5 - 5.1 mEq/L   Chloride 93 (*) 96 - 112 mEq/L   CO2 20  19 - 32 mEq/L   Glucose, Bld 239 (*) 70 - 99 mg/dL   BUN 25 (*) 6 - 23 mg/dL   Creatinine, Ser 4.69 (*) 0.50 - 1.35 mg/dL   Calcium 8.9  8.4 - 62.9 mg/dL   Total  Protein 6.9  6.0 - 8.3 g/dL   Albumin 3.1 (*) 3.5 - 5.2 g/dL   AST 35  0 - 37 U/L   ALT 22  0 - 53 U/L   Alkaline Phosphatase 58  39 - 117 U/L   Total Bilirubin 0.8  0.3 - 1.2 mg/dL   GFR calc non Af Amer 51 (*) >90 mL/min   GFR calc Af Amer 59 (*) >90 mL/min  TROPONIN I     Status: Abnormal   Collection Time    01/07/13  7:40 PM      Result Value Range   Troponin I 2.32 (*) <0.30 ng/mL  PRO B NATRIURETIC PEPTIDE     Status: Abnormal  Collection Time    01/07/13  7:40 PM      Result Value Range   Pro B Natriuretic peptide (BNP) 17379.0 (*) 0 - 125 pg/mL  LACTIC ACID, PLASMA     Status: Abnormal   Collection Time    01/07/13  7:40 PM      Result Value Range   Lactic Acid, Venous 2.6 (*) 0.5 - 2.2 mmol/L  URINALYSIS W MICROSCOPIC + REFLEX CULTURE     Status: Abnormal   Collection Time    01/07/13  8:49 PM      Result Value Range   Color, Urine YELLOW  YELLOW   APPearance CLEAR  CLEAR   Specific Gravity, Urine 1.027  1.005 - 1.030   pH 5.0  5.0 - 8.0   Glucose, UA >1000 (*) NEGATIVE mg/dL   Hgb urine dipstick SMALL (*) NEGATIVE   Bilirubin Urine NEGATIVE  NEGATIVE   Ketones, ur 15 (*) NEGATIVE mg/dL   Protein, ur 30 (*) NEGATIVE mg/dL   Urobilinogen, UA 0.2  0.0 - 1.0 mg/dL   Nitrite NEGATIVE  NEGATIVE   Leukocytes, UA NEGATIVE  NEGATIVE   WBC, UA 0-2  <3 WBC/hpf   RBC / HPF 0-2  <3 RBC/hpf   Squamous Epithelial / LPF FEW (*) RARE  TROPONIN I     Status: Abnormal   Collection Time    01/07/13  9:26 PM      Result Value Range   Troponin I 2.70 (*) <0.30 ng/mL  D-DIMER, QUANTITATIVE     Status: Abnormal   Collection Time    01/07/13 11:13 PM      Result Value Range   D-Dimer, Quant 2.66 (*) 0.00 - 0.48 ug/mL-FEU  GLUCOSE, CAPILLARY     Status: Abnormal   Collection Time    01/07/13 11:39 PM      Result Value Range   Glucose-Capillary 286 (*) 70 - 99 mg/dL   Comment 1 Notify RN    TROPONIN I     Status: Abnormal   Collection Time    01/08/13  3:50 AM       Result Value Range   Troponin I 2.57 (*) <0.30 ng/mL  COMPREHENSIVE METABOLIC PANEL     Status: Abnormal   Collection Time    01/08/13  3:50 AM      Result Value Range   Sodium 130 (*) 135 - 145 mEq/L   Potassium 3.9  3.5 - 5.1 mEq/L   Chloride 94 (*) 96 - 112 mEq/L   CO2 23  19 - 32 mEq/L   Glucose, Bld 218 (*) 70 - 99 mg/dL   BUN 26 (*) 6 - 23 mg/dL   Creatinine, Ser 4.40 (*) 0.50 - 1.35 mg/dL   Calcium 8.8  8.4 - 10.2 mg/dL   Total Protein 6.8  6.0 - 8.3 g/dL   Albumin 3.0 (*) 3.5 - 5.2 g/dL   AST 43 (*) 0 - 37 U/L   ALT 22  0 - 53 U/L   Alkaline Phosphatase 69  39 - 117 U/L   Total Bilirubin 0.7  0.3 - 1.2 mg/dL   GFR calc non Af Amer 50 (*) >90 mL/min   GFR calc Af Amer 59 (*) >90 mL/min  CBC WITH DIFFERENTIAL     Status: Abnormal   Collection Time    01/08/13  3:50 AM      Result Value Range   WBC 15.1 (*) 4.0 - 10.5 K/uL   RBC 3.83 (*) 4.22 -  5.81 MIL/uL   Hemoglobin 12.8 (*) 13.0 - 17.0 g/dL   HCT 04.5 (*) 40.9 - 81.1 %   MCV 94.5  78.0 - 100.0 fL   MCH 33.4  26.0 - 34.0 pg   MCHC 35.4  30.0 - 36.0 g/dL   RDW 91.4  78.2 - 95.6 %   Platelets 130 (*) 150 - 400 K/uL   Neutrophils Relative % 89 (*) 43 - 77 %   Neutro Abs 13.4 (*) 1.7 - 7.7 K/uL   Lymphocytes Relative 6 (*) 12 - 46 %   Lymphs Abs 1.0  0.7 - 4.0 K/uL   Monocytes Relative 5  3 - 12 %   Monocytes Absolute 0.7  0.1 - 1.0 K/uL   Eosinophils Relative 0  0 - 5 %   Eosinophils Absolute 0.0  0.0 - 0.7 K/uL   Basophils Relative 0  0 - 1 %   Basophils Absolute 0.0  0.0 - 0.1 K/uL  MRSA PCR SCREENING     Status: Abnormal   Collection Time    01/08/13  6:21 AM      Result Value Range   MRSA by PCR POSITIVE (*) NEGATIVE  GLUCOSE, CAPILLARY     Status: Abnormal   Collection Time    01/08/13  7:22 AM      Result Value Range   Glucose-Capillary 193 (*) 70 - 99 mg/dL   No intake or output data in the 24 hours ending 01/08/13 0838  EKG:  Sinus tachycardia, no acute ST T wave changes.  01/08/2013  ASSESSMENT  AND PLAN:  ELEVATED TROPONIN:  History of CAD.  However, this is suspected to be a type II enzyme bump.  Echo pending.  Continue current therapy.   HISTORY OF BRADYCARDIA:  Telemetry without bradycardia overnight.  Brief run of SVT.   Rollene Rotunda 01/08/2013 8:38 AM

## 2013-01-08 NOTE — Progress Notes (Signed)
PULMONARY  / CRITICAL CARE MEDICINE  Name: Kerry Waters MRN: [id number] DOB: March 12, 1946    ADMISSION DATE:  01/07/2013 CONSULTATION DATE:  01/08/2013  REFERRING MD :  Dr. Timothy Waters PRIMARY SERVICE: Internal Medicine  CHIEF COMPLAINT:  Confusion  BRIEF PATIENT DESCRIPTION: 67 year-old male with multiple medical problems some of the highlights of which include CAD, diabetes, hypertension, morbid obesity and sleep apnea who presents with confusion in the setting of cellulitis and elevated troponin.  SIGNIFICANT EVENTS / STUDIES:  1. BNP 17379.0 2. Trop 2.70 3. WBC 14.6 (91% N) 4. D-Dimer 2.66  LINES / TUBES: 1. PIV  CULTURES: 1. None  ANTIBIOTICS: 1. Vancomycin 9/21- 2. Zosyn 9/21  SUBJECTIVE No chest pain. Still tachycardia.    PHYSICAL EXAM  VITAL SIGNS: Temp:  [98.2 F (36.8 C)-102.1 F (38.9 C)] 102.1 F (38.9 C) (09/22 0719) Pulse Rate:  [74-101] 101 (09/22 0327) Resp:  [20-35] 30 (09/22 0327) BP: (100-146)/(47-88) 146/88 mmHg (09/22 0719) SpO2:  [90 %-100 %] 100 % (09/22 0327) Weight:  [151.4 kg (333 lb 12.4 oz)-152.3 kg (335 lb 12.2 oz)] 151.4 kg (333 lb 12.4 oz) (09/22 0327)  HEMODYNAMICS: Runs of SVT.  BP ok    VENTILATOR SETTINGS: [place] oxygen    INTAKE / OUTPUT: Intake/Output     09/21 0701 - 09/22 0700 09/22 0701 - 09/23 0700        Urine Occurrence 1 x      PHYSICAL EXAMINATION: General:  Morbidly obese male in no acute distress Neuro:  Grossly intact HEENT:   sclera anicteric, conjunctiva pink. Mucous membranes moist, oropharynx clear. Neck:  Trachea supple in midline, no lymphadenopathy or JVD Cardiovascular:  regular rate rhythm, normal S1-S2, no murmurs rubs or gallops>>runs of SVT Lungs:  Difficult examination secondary to patient body habitus, no gross rales or wheezes Abdomen:   obese, nontender, nondistended, positive bowels Musculoskeletal:  No clubbing or cyanosis; no edema of the left leg. 1+ edema to the knee on the right   Skin:  Erythema throughout the right leg to the proximal tibia. Chronic venous stasis changes on the left.   LABS:  CBC Recent Labs     01/07/13  1940  01/08/13  0350  WBC  14.6*  15.1*  HGB  13.3  12.8*  HCT  37.1*  36.2*  PLT  126*  130*    Coag's No results found for this basename: APTT, INR,  in the last 72 hours  BMET Recent Labs     01/07/13  1940  01/08/13  0350  NA  129*  130*  K  4.3  3.9  CL  93*  94*  CO2  20  23  BUN  25*  26*  CREATININE  1.39*  1.40*  GLUCOSE  239*  218*    Electrolytes Recent Labs     01/07/13  1940  01/08/13  0350  CALCIUM  8.9  8.8    Sepsis Markers No results found for this basename: LACTICACIDVEN, PROCALCITON, O2SATVEN,  in the last 72 hours  ABG No results found for this basename: PHART, PCO2ART, PO2ART,  in the last 72 hours  Liver Enzymes Recent Labs     01/07/13  1940  01/08/13  0350  AST  35  43*  ALT  22  22  ALKPHOS  58  69  BILITOT  0.8  0.7  ALBUMIN  3.1*  3.0*    Cardiac Enzymes Recent Labs     01/07/13  1940  01/07/13  2126  01/08/13  0350  TROPONINI  2.32*  2.70*  2.57*  PROBNP  17379.0*   --    --     Glucose Recent Labs     01/07/13  2339  01/08/13  0722  GLUCAP  286*  193*    Imaging Dg Chest 2 View  01/07/2013   CLINICAL DATA:  Chest pain status post fall. History of hypertension and diabetes.  EXAM: CHEST  2 VIEW  COMPARISON:  06/02/2010.  FINDINGS: The heart size and mediastinal contours are stable allowing for lordotic positioning. Patient is status post CABG. The upper sternotomy wires are fractured. The lungs are clear. There is no pleural effusion or pneumothorax. Telemetry leads overlie the chest. Mild thoracic spine degenerative changes are stable.  IMPRESSION: Stable postoperative chest. No acute cardiopulmonary process.   Electronically Signed   By: Kerry Waters   On: 01/07/2013 19:24   Dg Pelvis 1-2 Views  01/07/2013   CLINICAL DATA:  Joint pain status post fall.  EXAM:  PELVIS - 1-2 VIEW  COMPARISON:  None.  FINDINGS: The mineralization and alignment are normal. There is no evidence of acute fracture or dislocation. The hip joint spaces are preserved. Scattered vascular calcifications are noted.  IMPRESSION: No acute osseous findings.   Electronically Signed   By: Kerry Waters   On: 01/07/2013 19:19   Ct Head Wo Contrast  01/07/2013   CLINICAL DATA:  Confusion; recent trauma; fever  EXAM: CT HEAD WITHOUT CONTRAST  TECHNIQUE: Contiguous axial images were obtained from the base of the skull through the vertex without intravenous contrast. Study was obtained within 24 hr of patient's arrival at the emergency department.  COMPARISON:  May 23, 2012  FINDINGS: Ventricles are normal in size and configuration. There is no mass, hemorrhage, extra-axial fluid collection, or midline shift. There is evidence of old infarct in the medial right occipital lobe. Elsewhere, gray-white compartments are normal. There is no demonstrable acute infarct.  Bony calvarium appears intact. The mastoid air cells are clear.  IMPRESSION: Remote medial aspect right occipital lobe infarct. Study otherwise unremarkable. In particular, there is no appreciable mass, hemorrhage, or acute appearing infarct.   Electronically Signed   By: Kerry Waters   On: 01/07/2013 19:26    EKG: No acute ST changes. CXR: Chest x-ray from today was personally reviewed by me. It is essentially normal, however, limited by patient body habitus.  ASSESSMENT / PLAN: Active Problems:   Cellulitis   Sepsis   Elevated troponin   Hyponatremia   Hyperglycemia   Elevated brain natriuretic peptide (BNP) level   Elevated lactic acid level   Confusion   PULMONARY A: 1. Mild tachypnea: This is likely secondary to the patient's minimal acidosis. While he denies shortness of breath he does appear slightly shortness of breath on examination. 2. Sleep apnea  P:   F/u dopplers LE, v/q scan, empiric  anticoagulation  cpap qhs   CARDIOVASCULAR A:  1. DM, demand ischemia, tachycardia  Acute heart failure:  2. Suspected severe sepsis: Mr. Kerry Waters does meet at least 2 surgical criteria and the setting of infection and severe sepsis is a reasonable consideration.  P:   Defer management of MI and heart failure to cardiology  Antibiotics per below; would not add additional fluid at this time  RENAL A:  1. Acute kidney injury: Suspect this is secondary to either sepsis or acute fluid overload  P:    Serial creatinine  GASTROINTESTINAL A:  No acute issue   HEMATOLOGIC A:    No Acute Issue  INFECTIOUS A: 1. Right lower extremity  cellulitis:   P:   Cont vanco/zosyn  ENDOCRINE A:   1. Diabetes:   P:    Defer to PCP   NEUROLOGIC A:  Altered mental status: improved am 9/22 P:    Defer workup to primary care physician   PCCM will see again PRN.  SDU level of care is appropriate at this time  Caryl Bis  [phone]  Cell  [phone]  If no response or cell goes to voicemail, call beeper [phone]  01/08/2013, 9:23 AM

## 2013-01-08 NOTE — Progress Notes (Signed)
ANTIBIOTIC CONSULT NOTE - INITIAL  Pharmacy Consult for vancomycin, zosyn, lovenox Indication: acute cellulitis and right toe infection, r/o MI  No Known Allergies  Patient Measurements:   Body Weight: 155 kg  Vital Signs: Temp: 99.1 F (37.3 C) (09/21 1746) Temp src: Oral (09/21 1746) BP: 115/58 mmHg (09/21 2245) Pulse Rate: 101 (09/21 2245) Intake/Output from previous day:   Intake/Output from this shift:    Labs:  Recent Labs  01/07/13 1940  WBC 14.6*  HGB 13.3  PLT 126*  CREATININE 1.39*   The CrCl is unknown because both a height and weight (above a minimum accepted value) are required for this calculation. No results found for this basename: VANCOTROUGH, VANCOPEAK, VANCORANDOM, GENTTROUGH, GENTPEAK, GENTRANDOM, TOBRATROUGH, TOBRAPEAK, TOBRARND, AMIKACINPEAK, AMIKACINTROU, AMIKACIN,  in the last 72 hours   Microbiology: No results found for this or any previous visit (from the past 720 hour(s)).  Medical History: Past Medical History  Diagnosis Date  . Joint pain   . Ulcer of foot   . Ischemic heart disease   . MI (myocardial infarction) 1987    ANTERIOR SEPTAL  . Hypokinesis     MILD INFERIOR  . Hypertension   . Edema of lower extremity   . Obesities, morbid   . SVT (supraventricular tachycardia)   . Hx of CABG 1993  . Abnormal cardiovascular stress test 2008    EF 48%. No clear cut ischemia. Low risk scan  . Diabetes mellitus     Dr. Timothy Lasso follows.  [name] PVD (peripheral vascular disease)   . Venous stasis   . Peripheral neuropathy   . Hyperlipidemia   . Coronary artery disease   . Sleep apnea     CPAP  . History of angina   . Cancer     basil cell carcinoma  . Stroke     pt states possible stroke on 04/25/2012    Medications:  Prescriptions prior to admission  Medication Sig Dispense Refill  . aspirin EC 325 MG tablet Take 325 mg by mouth daily.      [name] atorvastatin (LIPITOR) 80 MG tablet Take 80 mg by mouth daily.      . Canagliflozin  (INVOKANA) 100 MG TABS Take 100 mg by mouth daily.       . clopidogrel (PLAVIX) 75 MG tablet Take 75 mg by mouth daily.      . Coenzyme Q10 300 MG CAPS Take 1 capsule by mouth daily.        [name] diltiazem (CARDIZEM CD) 180 MG 24 hr capsule Take 180 mg by mouth 2 (two) times daily.      . Exenatide (BYETTA 5 MCG PEN [place]) Inject 10 mg into the skin 2 (two) times daily.       [name] glimepiride (AMARYL) 4 MG tablet Take 4 mg by mouth 2 (two) times daily. Take 1 in am and 1 in pm      . hydrochlorothiazide (HYDRODIURIL) 25 MG tablet Take 25 mg by mouth daily.      [name] ibuprofen (ADVIL,MOTRIN) 200 MG tablet Take 200 mg by mouth every 6 (six) hours as needed for pain.      [name] insulin detemir (LEVEMIR) 100 UNIT/ML injection Inject 45 Units into the skin at bedtime.      [name] labetalol (NORMODYNE) 200 MG tablet Take 200 mg by mouth 2 (two) times daily.       [name] lisinopril (PRINIVIL,ZESTRIL) 40 MG tablet Take 40 mg by mouth daily.      [name]  metFORMIN (GLUCOPHAGE) 1000 MG tablet Take 1,000 mg by mouth 2 (two) times daily with a meal.      . Multiple Vitamin (MULTIVITAMIN WITH MINERALS) TABS tablet Take 1 tablet by mouth daily.      . Multiple Vitamin (MULTIVITAMIN) tablet Take 1 tablet by mouth daily.        . naproxen sodium (ANAPROX) 220 MG tablet Take 440 mg by mouth daily as needed (for pain).      [name] spironolactone (ALDACTONE) 25 MG tablet Take 25 mg by mouth daily.      . vitamin C (ASCORBIC ACID) 500 MG tablet Take 500 mg by mouth daily.      . Zinc 40 MG TABS Take 40 mg by mouth daily.       . cefdinir (OMNICEF) 300 MG capsule Take 300 mg by mouth 2 (two) times daily. Started 01/07/13, for 10 days, ending 01/16/13       Assessment: 67 yo man to start empiric antibiotics for cellulits, r toe infection.  He received vanc 2 gm and zosyn 3.375 in ED. He was noted to have positive tropnins possibly due to sepsis, demand ischemia or PE.  He will start lovenox.  Goal of Therapy:  Vancomycin trough level 15-20  mcg/ml Eradication of infection 4 hr Xa level 0.6-1 unit/ml  Plan:  Vancomycin 1500 mg IV q12 hours Zosyn 3.375 gm IV q8 hours  Lovenox 150 mg sq q12 hours. F/u renal function, cultures and clinical course  Talbert Cage Poteet 01/08/2013,12:20 AM

## 2013-01-08 NOTE — Progress Notes (Addendum)
*  PRELIMINARY RESULTS* Vascular Ultrasound Lower extremity venous duplex has been completed.  Preliminary findings: Technically limited study due to body habitus. No obvious DVT noted in visualized veins of lower extremities.  Pulmonary embolism cannot be ruled out with this test.   Farrel Demark, RDMS, RVT  01/08/2013, 10:17 AM

## 2013-01-08 NOTE — Consult Note (Signed)
Reason for Consult: Ulcer right great toe with cellulitis and venous stasis insufficiency right lower extremity Referring Physician: Dr Lucile Crater is an 67 y.o. male.  HPI: Patient is a 67 year old gentleman who states he's been treated for 3 years at the hyperbaric wound clinic. He states he is undergone hyperbaric oxygen therapy total contact casting pressure unloading shoes. States he's been released from the wound center despite him still having a persistent ulcer on the plantar aspect of the IP joint right great toe.  Past Medical History  Diagnosis Date  . Joint pain   . Ulcer of foot   . Ischemic heart disease   . MI (myocardial infarction) 1987    ANTERIOR SEPTAL  . Hypokinesis     MILD INFERIOR  . Hypertension   . Edema of lower extremity   . Obesities, morbid   . SVT (supraventricular tachycardia)   . Hx of CABG 1993  . Abnormal cardiovascular stress test 2008    EF 48%. No clear cut ischemia. Low risk scan  . Diabetes mellitus     Dr. Timothy Lasso follows.  [name] PVD (peripheral vascular disease)   . Venous stasis   . Peripheral neuropathy   . Hyperlipidemia   . Coronary artery disease   . Sleep apnea     CPAP  . History of angina   . Cancer     basil cell carcinoma  . Stroke     pt states possible stroke on 04/25/2012    Past Surgical History  Procedure Laterality Date  . Coronary artery bypass graft  1993    LIMA to LAD with patch angioplasty, SVG to OM, SVG to OM & PD and patch angioplasy to PDA  . Left ankle orif  2000    Family History  Problem Relation Age of Onset  . Heart attack Father   . Heart disease Father     before age 58  . Cancer Mother     stomach cancer  . Heart attack Maternal Grandfather   . Heart attack Paternal Grandfather   . Cancer Sister     breast  . Diabetes Son     Social History:  reports that he quit smoking about 27 years ago. His smoking use included Cigarettes. He has a 30 pack-year smoking history. He has never  used smokeless tobacco. He reports that he does not drink alcohol or use illicit drugs.  Allergies: No Known Allergies  Medications: I have reviewed the patient's current medications.  Results for orders placed during the hospital encounter of 01/07/13 (from the past 48 hour(s))  CBC WITH DIFFERENTIAL     Status: Abnormal   Collection Time    01/07/13  7:40 PM      Result Value Range   WBC 14.6 (*) 4.0 - 10.5 K/uL   RBC 3.94 (*) 4.22 - 5.81 MIL/uL   Hemoglobin 13.3  13.0 - 17.0 g/dL   HCT 16.1 (*) 09.6 - 04.5 %   MCV 94.2  78.0 - 100.0 fL   MCH 33.8  26.0 - 34.0 pg   MCHC 35.8  30.0 - 36.0 g/dL   RDW 40.9  81.1 - 91.4 %   Platelets 126 (*) 150 - 400 K/uL   Neutrophils Relative % 91 (*) 43 - 77 %   Neutro Abs 13.3 (*) 1.7 - 7.7 K/uL   Lymphocytes Relative 5 (*) 12 - 46 %   Lymphs Abs 0.7  0.7 - 4.0 K/uL   Monocytes  Relative 4  3 - 12 %   Monocytes Absolute 0.5  0.1 - 1.0 K/uL   Eosinophils Relative 0  0 - 5 %   Eosinophils Absolute 0.0  0.0 - 0.7 K/uL   Basophils Relative 0  0 - 1 %   Basophils Absolute 0.0  0.0 - 0.1 K/uL  COMPREHENSIVE METABOLIC PANEL     Status: Abnormal   Collection Time    01/07/13  7:40 PM      Result Value Range   Sodium 129 (*) 135 - 145 mEq/L   Potassium 4.3  3.5 - 5.1 mEq/L   Chloride 93 (*) 96 - 112 mEq/L   CO2 20  19 - 32 mEq/L   Glucose, Bld 239 (*) 70 - 99 mg/dL   BUN 25 (*) 6 - 23 mg/dL   Creatinine, Ser 4.09 (*) 0.50 - 1.35 mg/dL   Calcium 8.9  8.4 - 81.1 mg/dL   Total Protein 6.9  6.0 - 8.3 g/dL   Albumin 3.1 (*) 3.5 - 5.2 g/dL   AST 35  0 - 37 U/L   ALT 22  0 - 53 U/L   Alkaline Phosphatase 58  39 - 117 U/L   Total Bilirubin 0.8  0.3 - 1.2 mg/dL   GFR calc non Af Amer 51 (*) >90 mL/min   GFR calc Af Amer 59 (*) >90 mL/min   Comment: (NOTE)     The eGFR has been calculated using the CKD EPI equation.     This calculation has not been validated in all clinical situations.     eGFR's persistently <90 mL/min signify possible Chronic  Kidney     Disease.  TROPONIN I     Status: Abnormal   Collection Time    01/07/13  7:40 PM      Result Value Range   Troponin I 2.32 (*) <0.30 ng/mL   Comment:            Due to the release kinetics of cTnI,     a negative result within the first hours     of the onset of symptoms does not rule out     myocardial infarction with certainty.     If myocardial infarction is still suspected,     repeat the test at appropriate intervals.     REPEATED TO VERIFY     CRITICAL RESULT CALLED TO, READ BACK BY AND VERIFIED WITH:     GRANT,G RN 01/07/13 2055 WOOTEN,K  PRO B NATRIURETIC PEPTIDE     Status: Abnormal   Collection Time    01/07/13  7:40 PM      Result Value Range   Pro B Natriuretic peptide (BNP) 17379.0 (*) 0 - 125 pg/mL  LACTIC ACID, PLASMA     Status: Abnormal   Collection Time    01/07/13  7:40 PM      Result Value Range   Lactic Acid, Venous 2.6 (*) 0.5 - 2.2 mmol/L  URINALYSIS W MICROSCOPIC + REFLEX CULTURE     Status: Abnormal   Collection Time    01/07/13  8:49 PM      Result Value Range   Color, Urine YELLOW  YELLOW   APPearance CLEAR  CLEAR   Specific Gravity, Urine 1.027  1.005 - 1.030   pH 5.0  5.0 - 8.0   Glucose, UA >1000 (*) NEGATIVE mg/dL   Hgb urine dipstick SMALL (*) NEGATIVE   Bilirubin Urine NEGATIVE  NEGATIVE   Ketones, ur  15 (*) NEGATIVE mg/dL   Protein, ur 30 (*) NEGATIVE mg/dL   Urobilinogen, UA 0.2  0.0 - 1.0 mg/dL   Nitrite NEGATIVE  NEGATIVE   Leukocytes, UA NEGATIVE  NEGATIVE   WBC, UA 0-2  <3 WBC/hpf   RBC / HPF 0-2  <3 RBC/hpf   Squamous Epithelial / LPF FEW (*) RARE  TROPONIN I     Status: Abnormal   Collection Time    01/07/13  9:26 PM      Result Value Range   Troponin I 2.70 (*) <0.30 ng/mL   Comment:            Due to the release kinetics of cTnI,     a negative result within the first hours     of the onset of symptoms does not rule out     myocardial infarction with certainty.     If myocardial infarction is still suspected,      repeat the test at appropriate intervals.     REPEATED TO VERIFY     CRITICAL RESULT CALLED TO, READ BACK BY AND VERIFIED WITH:     Judee Clara RN [id number] EBANKS COLCLOUGH, S  D-DIMER, QUANTITATIVE     Status: Abnormal   Collection Time    01/07/13 11:13 PM      Result Value Range   D-Dimer, Quant 2.66 (*) 0.00 - 0.48 ug/mL-FEU   Comment:            AT THE INHOUSE ESTABLISHED CUTOFF     VALUE OF 0.48 ug/mL FEU,     THIS ASSAY HAS BEEN DOCUMENTED     IN THE LITERATURE TO HAVE     A SENSITIVITY AND NEGATIVE     PREDICTIVE VALUE OF AT LEAST     98 TO 99%.  THE TEST RESULT     SHOULD BE CORRELATED WITH     AN ASSESSMENT OF THE CLINICAL     PROBABILITY OF DVT / VTE.  GLUCOSE, CAPILLARY     Status: Abnormal   Collection Time    01/07/13 11:39 PM      Result Value Range   Glucose-Capillary 286 (*) 70 - 99 mg/dL   Comment 1 Notify RN    TROPONIN I     Status: Abnormal   Collection Time    01/08/13  3:50 AM      Result Value Range   Troponin I 2.57 (*) <0.30 ng/mL   Comment:            Due to the release kinetics of cTnI,     a negative result within the first hours     of the onset of symptoms does not rule out     myocardial infarction with certainty.     If myocardial infarction is still suspected,     repeat the test at appropriate intervals.     REPEATED TO VERIFY     CRITICAL VALUE NOTED.  VALUE IS CONSISTENT WITH PREVIOUSLY REPORTED AND CALLED VALUE.  COMPREHENSIVE METABOLIC PANEL     Status: Abnormal   Collection Time    01/08/13  3:50 AM      Result Value Range   Sodium 130 (*) 135 - 145 mEq/L   Potassium 3.9  3.5 - 5.1 mEq/L   Chloride 94 (*) 96 - 112 mEq/L   CO2 23  19 - 32 mEq/L   Glucose, Bld 218 (*) 70 - 99 mg/dL   BUN 26 (*) 6 -  23 mg/dL   Creatinine, Ser 1.61 (*) 0.50 - 1.35 mg/dL   Calcium 8.8  8.4 - 09.6 mg/dL   Total Protein 6.8  6.0 - 8.3 g/dL   Albumin 3.0 (*) 3.5 - 5.2 g/dL   AST 43 (*) 0 - 37 U/L   ALT 22  0 - 53 U/L   Alkaline  Phosphatase 69  39 - 117 U/L   Total Bilirubin 0.7  0.3 - 1.2 mg/dL   GFR calc non Af Amer 50 (*) >90 mL/min   GFR calc Af Amer 59 (*) >90 mL/min   Comment: (NOTE)     The eGFR has been calculated using the CKD EPI equation.     This calculation has not been validated in all clinical situations.     eGFR's persistently <90 mL/min signify possible Chronic Kidney     Disease.  CBC WITH DIFFERENTIAL     Status: Abnormal   Collection Time    01/08/13  3:50 AM      Result Value Range   WBC 15.1 (*) 4.0 - 10.5 K/uL   RBC 3.83 (*) 4.22 - 5.81 MIL/uL   Hemoglobin 12.8 (*) 13.0 - 17.0 g/dL   HCT 04.5 (*) 40.9 - 81.1 %   MCV 94.5  78.0 - 100.0 fL   MCH 33.4  26.0 - 34.0 pg   MCHC 35.4  30.0 - 36.0 g/dL   RDW 91.4  78.2 - 95.6 %   Platelets 130 (*) 150 - 400 K/uL   Neutrophils Relative % 89 (*) 43 - 77 %   Neutro Abs 13.4 (*) 1.7 - 7.7 K/uL   Lymphocytes Relative 6 (*) 12 - 46 %   Lymphs Abs 1.0  0.7 - 4.0 K/uL   Monocytes Relative 5  3 - 12 %   Monocytes Absolute 0.7  0.1 - 1.0 K/uL   Eosinophils Relative 0  0 - 5 %   Eosinophils Absolute 0.0  0.0 - 0.7 K/uL   Basophils Relative 0  0 - 1 %   Basophils Absolute 0.0  0.0 - 0.1 K/uL  MRSA PCR SCREENING     Status: Abnormal   Collection Time    01/08/13  6:21 AM      Result Value Range   MRSA by PCR POSITIVE (*) NEGATIVE   Comment:            The GeneXpert MRSA Assay (FDA     approved for NASAL specimens     only), is one component of a     comprehensive MRSA colonization     surveillance program. It is not     intended to diagnose MRSA     infection nor to guide or     monitor treatment for     MRSA infections.     RESULT CALLED TO, READ BACK BY AND VERIFIED WITH:     H MILLS,RN 9/22 0747 BY K SCHULTZ  GLUCOSE, CAPILLARY     Status: Abnormal   Collection Time    01/08/13  7:22 AM      Result Value Range   Glucose-Capillary 193 (*) 70 - 99 mg/dL  GLUCOSE, CAPILLARY     Status: Abnormal   Collection Time    01/08/13 12:17 PM       Result Value Range   Glucose-Capillary 196 (*) 70 - 99 mg/dL   Comment 1 Notify RN     Comment 2 Documented in Chart      Dg Chest  2 View  01/07/2013   CLINICAL DATA:  Chest pain status post fall. History of hypertension and diabetes.  EXAM: CHEST  2 VIEW  COMPARISON:  06/02/2010.  FINDINGS: The heart size and mediastinal contours are stable allowing for lordotic positioning. Patient is status post CABG. The upper sternotomy wires are fractured. The lungs are clear. There is no pleural effusion or pneumothorax. Telemetry leads overlie the chest. Mild thoracic spine degenerative changes are stable.  IMPRESSION: Stable postoperative chest. No acute cardiopulmonary process.   Electronically Signed   By: Roxy Horseman   On: 01/07/2013 19:24   Dg Pelvis 1-2 Views  01/07/2013   CLINICAL DATA:  Joint pain status post fall.  EXAM: PELVIS - 1-2 VIEW  COMPARISON:  None.  FINDINGS: The mineralization and alignment are normal. There is no evidence of acute fracture or dislocation. The hip joint spaces are preserved. Scattered vascular calcifications are noted.  IMPRESSION: No acute osseous findings.   Electronically Signed   By: Roxy Horseman   On: 01/07/2013 19:19   Ct Head Wo Contrast  01/07/2013   CLINICAL DATA:  Confusion; recent trauma; fever  EXAM: CT HEAD WITHOUT CONTRAST  TECHNIQUE: Contiguous axial images were obtained from the base of the skull through the vertex without intravenous contrast. Study was obtained within 24 hr of patient's arrival at the emergency department.  COMPARISON:  May 23, 2012  FINDINGS: Ventricles are normal in size and configuration. There is no mass, hemorrhage, extra-axial fluid collection, or midline shift. There is evidence of old infarct in the medial right occipital lobe. Elsewhere, gray-white compartments are normal. There is no demonstrable acute infarct.  Bony calvarium appears intact. The mastoid air cells are clear.  IMPRESSION: Remote medial aspect right  occipital lobe infarct. Study otherwise unremarkable. In particular, there is no appreciable mass, hemorrhage, or acute appearing infarct.   Electronically Signed   By: Bretta Bang   On: 01/07/2013 19:26   Dg Foot Complete Right  01/08/2013   CLINICAL DATA:  Ulcer right great toe, diabetes, question osteomyelitis  EXAM: RIGHT FOOT COMPLETE - 3+ VIEW  COMPARISON:  02/24/2012 great toe radiographs, right foot radiographs 06/02/2010  FINDINGS: Dressing artifacts at great toe.  Diffuse osseous demineralization.  Joint spaces preserved.  Soft tissue swelling greatest at dorsum of foot.  Cortices at the phalanges of the great toe appear intact.  No fracture, dislocation or bone destruction.  Specifically no definite evidence of bone destruction at the great toe to suggest osteomyelitis.  Small subchondral cyst is noted at the base of the middle phalanx of the 2nd toe.  Scattered atherosclerotic calcification at ankle.  IMPRESSION: No definite evidence of osteomyelitis at the great toe. Significant soft tissue swelling greatest at dorsum of right foot.  If there is persistent clinical concern for osteomyelitis, then recommend MR imaging of the right foot with and without contrast for further evaluation.   Electronically Signed   By: Ulyses Southward M.D.   On: 01/08/2013 13:10    Review of Systems  All other systems reviewed and are negative.   Blood pressure 100/61, pulse 101, temperature 98.6 F (37 C), temperature source Oral, resp. rate 30, height 5\' 11"  (1.803 m), weight 151.4 kg (333 lb 12.4 oz), SpO2 100.00%. Physical Exam On examination patient has a faintly palpable dorsalis pedis pulse. He does not have ischemic changes to the foot. He has brawny skin color changes in both legs from chronic venous stasis insufficiency. He has cellulitis and increased swelling  to the right foot and right lower extremity. The right great toe has a chronic ulcer beneath the IP joint. The ulcer does not probe the bone  was about 5 mm in diameter about 5 mm deep. Review of the radiograph shows lytic changes of the distal phalanx at the IP joint of the right great toe. Patient states he did have an MRI scan about 10 months ago which was reported as normal. Assessment/Plan: Assessment: Venous stasis insufficiency with cellulitis right lower extremity. #2 chronic ulcer right great toe with lytic changes to the distal phalanx of the right great toe at the IP joint.  Plan: We'll plan for an MRI scan to evaluate for the possibility of chronic osteomyelitis in light of the chronic ulcer and the lytic bone changes. Will have the patient placed in a Profore wrap to decrease the swelling continue IV antibiotics I will followup tomorrow.  Kristinia Leavy V 01/08/2013, 5:45 PM

## 2013-01-08 NOTE — Progress Notes (Addendum)
Subjective: Admitted last night c fever, Confusion, RLE Cellulitis, Toe Ulcer, Leukocytosis, Elevated Trop I and Sepsis Seen by Cards and CC overnight. Now in Stepdown on IV Abx. Trying to get OOB. More interactive  Objective: Vital signs in last 24 hours: Temp:  [98.2 F (36.8 C)-102.1 F (38.9 C)] 102.1 F (38.9 C) (09/22 0719) Pulse Rate:  [74-101] 101 (09/22 0327) Resp:  [20-35] 30 (09/22 0327) BP: (100-146)/(47-88) 146/88 mmHg (09/22 0719) SpO2:  [90 %-100 %] 100 % (09/22 0327) Weight:  [151.4 kg (333 lb 12.4 oz)-152.3 kg (335 lb 12.2 oz)] 151.4 kg (333 lb 12.4 oz) (09/22 0327) Weight change:     CBG (last 3)   Recent Labs  01/07/13 2339 01/08/13 0722  GLUCAP 286* 193*    Intake/Output from previous day: No intake or output data in the 24 hours ending 01/08/13 0735     Physical Exam General appearance: Obese. Alert.  A little off Eyes: no scleral icterus Throat: oropharynx moist without erythema Resp: Rhonchi. Cardio: Reg. Sternotomy. tachy GI: IObese. soft, non-tender; bowel sounds normal; no masses,  no organomegaly Extremities: + Edema.  CVI LLE.  RLE c sig Cellulitis to below knee.  R Great toe Wrapped up.  Lab Results:  Recent Labs  01/07/13 1940 01/08/13 0350  NA 129* 130*  K 4.3 3.9  CL 93* 94*  CO2 20 23  GLUCOSE 239* 218*  BUN 25* 26*  CREATININE 1.39* 1.40*  CALCIUM 8.9 8.8     Recent Labs  01/07/13 1940 01/08/13 0350  AST 35 43*  ALT 22 22  ALKPHOS 58 69  BILITOT 0.8 0.7  PROT 6.9 6.8  ALBUMIN 3.1* 3.0*     Recent Labs  01/07/13 1940 01/08/13 0350  WBC 14.6* 15.1*  NEUTROABS 13.3* 13.4*  HGB 13.3 12.8*  HCT 37.1* 36.2*  MCV 94.2 94.5  PLT 126* 130*    No results found for this basename: INR, PROTIME     Recent Labs  01/07/13 1940 01/07/13 2126 01/08/13 0350  TROPONINI 2.32* 2.70* 2.57*    No results found for this basename: TSH, T4TOTAL, FREET3, T3FREE, THYROIDAB,  in the last 72 hours  No results  found for this basename: VITAMINB12, FOLATE, FERRITIN, TIBC, IRON, RETICCTPCT,  in the last 72 hours  Micro Results: No results found for this or any previous visit (from the past 240 hour(s)).   Studies/Results: Dg Chest 2 View  01/07/2013   CLINICAL DATA:  Chest pain status post fall. History of hypertension and diabetes.  EXAM: CHEST  2 VIEW  COMPARISON:  06/02/2010.  FINDINGS: The heart size and mediastinal contours are stable allowing for lordotic positioning. Patient is status post CABG. The upper sternotomy wires are fractured. The lungs are clear. There is no pleural effusion or pneumothorax. Telemetry leads overlie the chest. Mild thoracic spine degenerative changes are stable.  IMPRESSION: Stable postoperative chest. No acute cardiopulmonary process.   Electronically Signed   By: Roxy Horseman   On: 01/07/2013 19:24   Dg Pelvis 1-2 Views  01/07/2013   CLINICAL DATA:  Joint pain status post fall.  EXAM: PELVIS - 1-2 VIEW  COMPARISON:  None.  FINDINGS: The mineralization and alignment are normal. There is no evidence of acute fracture or dislocation. The hip joint spaces are preserved. Scattered vascular calcifications are noted.  IMPRESSION: No acute osseous findings.   Electronically Signed   By: Roxy Horseman   On: 01/07/2013 19:19   Ct Head Wo Contrast  01/07/2013  CLINICAL DATA:  Confusion; recent trauma; fever  EXAM: CT HEAD WITHOUT CONTRAST  TECHNIQUE: Contiguous axial images were obtained from the base of the skull through the vertex without intravenous contrast. Study was obtained within 24 hr of patient's arrival at the emergency department.  COMPARISON:  May 23, 2012  FINDINGS: Ventricles are normal in size and configuration. There is no mass, hemorrhage, extra-axial fluid collection, or midline shift. There is evidence of old infarct in the medial right occipital lobe. Elsewhere, gray-white compartments are normal. There is no demonstrable acute infarct.  Bony calvarium appears  intact. The mastoid air cells are clear.  IMPRESSION: Remote medial aspect right occipital lobe infarct. Study otherwise unremarkable. In particular, there is no appreciable mass, hemorrhage, or acute appearing infarct.   Electronically Signed   By: Bretta Bang   On: 01/07/2013 19:26     Medications: Scheduled: . aspirin EC  325 mg Oral Daily  . atorvastatin  80 mg Oral Daily  . clopidogrel  75 mg Oral Q breakfast  . diltiazem  180 mg Oral BID  . enoxaparin (LOVENOX) injection  150 mg Subcutaneous Q12H  . hydrochlorothiazide  25 mg Oral Daily  . insulin aspart  0-15 Units Subcutaneous TID WC  . insulin aspart  0-5 Units Subcutaneous QHS  . insulin aspart  3 Units Subcutaneous TID WC  . insulin detemir  45 Units Subcutaneous QHS  . labetalol  200 mg Oral BID  . lisinopril  10 mg Oral BID  . multivitamin with minerals  1 tablet Oral Daily  . saccharomyces boulardii  250 mg Oral BID  . sodium chloride  3 mL Intravenous Q12H  . spironolactone  25 mg Oral Daily   Continuous: . sodium chloride 30 mL/hr at 01/08/13 0700     Assessment/Plan: Active Problems:   Cellulitis   Sepsis   Elevated troponin   Hyponatremia   Hyperglycemia   Elevated brain natriuretic peptide (BNP) level   Elevated lactic acid level   Confusion  RLE Cellulitis/Toe Ulcer - Will continue IV Abx.  CCM suggested switching to vancomycin and meropenem to reduce likelihood of propagating renal injury.  Will monitor cr and adjust as needed.   Ortho to see @ LE and toe Amputation - Maybe more.  LE Venous Duplex and LE Art Dopplers ordered.  Fever/Confusion/Leukocytosis/Tachypnea/Acidosis/Elevated D Dimer due to Sepsis.  Seen by CC last night.  LE Dopplers/V-Q scan. As far as confusion - CCT = Old CVA nothing acute.  He is near baseline this am.  Elevated troponin due to sepsis vrs Demand Ischemia.  Seen by Dr Donnie Aho last night and his recs AND MINE ARE: 1. Obtain serial cardiac enzymes but go ahead and treat  sepsis and possible cellulitis. - Peak Trop 2.57. 2. On full dose anticoagulation  3. Continue other cardiac medications  4. Check 2d echo ORDERED  CAD/CABG c Demand ischemia and Acute CHF  AKI - Mild Cr bump up to 1.4  CVI - Chronic  Morbid Obesity - Needs weight loss.  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    LOS: 1 day   Avalina Benko M 01/08/2013, 7:35 AM  From 11/2012 APE:  Past Medical History (reviewed - no changes required): CAD/Ischemic CM/Myocardial infarction (1987)/CBG 4 v CABG. EF 45%. H/O SVT.  H/O Venous Stasis Ulcers. Cellulitis, LLE. CVI, LE Edema. Fatty Liver. Ventral Hernia. Obesity. OA, OSA on CPAP, Hyperlipidemia. Broken ankle and wrist, BPH, DM2. H/O infected R Toe Callous/Ulcer  Surgical History (reviewed - no changes required): Excision of basal cell carcinoma from right shoulder (2005)  Open reduction and internal fixation of left ankle (2000)  CABG (1993)  Pilonidal cyst x 2 (1983)  R arm tumor removal. Now S/P B cataracts  Problem # 1: HEALTH MAINTENANCE EXAM (ICD-V70.0) (ICD10-Z00.8)  Flu - Upcoming  PNA in past ?time- Prevnar today  Td/Tdap 01/2008  Zostavax not done yet  DRE done today  CXR 03/2010 - see below  EKG 10/09/12 - Junctional Brady c pauses.  Colonoscopy - Never had one. Set up  Safety, Sunscreen, Seatbelts  Lab Reviewed  Skin Exam  FLOWSHEET Values:   Colonoscopy:recommended (11/24/2012 10:43:37 AM)  Chest X-Ray:Chest X-ray showed athersclerotic findings/H/O CABG/and NACPDz (04/03/2010 3:41:34 PM)  EKG:10/09/12 EKG: Junctional rhythm w/ this Atrial contractions, axis wnl, early transition, NSTW changes Hochrein. (10/09/2012 10:22:37 AM)  DEXA: never (09/14/2012 3:07:08 PM)  Smoking Status: Former smoker (11/24/2012 10:43:37 AM)  Td Vaccine: DONE (02/07/2008 12:50:10 PM)  Pneumovax: Has had dose-unsure of when (02/10/2012 9:54:26 AM)  Zostavax:  Eye Examination: 04/27/12 check w/ Dr.SNipes-possible stroke that affected visionper pt (04/27/2012 4:12:18 PM)  EGFR: 73.1 (?) (07/07/2012 2:27:00 PM) ,60.4 (?) (07/07/2012 2:27:00 PM)  Flu Vaccine: Fluvax (02/10/2012 9:54:26 AM)  TDAP:  Prevnar:  Orders:  Immunization Admin ([id number])  Prevnar ([id number])  Digital Rectal Exam Prostate CA Screen (CPT-G0102)  Colonoscopy (COLON)  Problem # 2: 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  still c Residual.  Do not expect more to come back.  Problem # 3: CVA (ICD-434.91) (ICD10-I63.9)  His updated medication list for this problem includes:  Aspirin 325 Mg Tabs (Aspirin) .[name]... 1 qd  Had a CVA 04/2012 c improved but still noticable L Visual Field Defect.  Sxs are still present but some improvement in eyesight.  Peripheral Vision is most affected.  off Generic plavix  On ASA - can go off for colon for short term as it has been 7+ months since CVA  Problem # 4: DIABETIC FOOT ULCER, GREAT TOE, RIGHT (ICD-707.15) (JYN82-N56.213)  Dr Wiliam Ke had been treating toe for 3 yrs.  Finally healed.  Will wer DM socks, DM2 shoes and corn pad  Go back prn  Dr Hart Rochester did R Leg Saph Vein ablation.  Seen at wound center @ 2weeks ago and toe is healed!!  No Pain,  No Wound.  No skin breakdown.  has Rx for me to fill out for Orthotics per [employer] - for DM2 and Plantar Fasciitis.  Saw Dr Forest Becker in past.  Problem # 5: CARDIOMYOPATHY,  ISCHEMIC (ICD-414.8) (ICD10-I25.5)  His updated medication list for this problem includes:  Aspirin 325 Mg Tabs (Aspirin) .[name]... 1 qd  Spironolactone 25 Mg Tabs (Spironolactone) .[name]... 1 qd  Lisinopril 40 Mg Tabs (Lisinopril) .[name]... Take one by mouth every day  Hydrochlorothiazide  25 Mg Tabs (Hydrochlorothiazide) .[name]... Take one tablet by mouth once a day  Diltiazem Hcl Er Beads 180 Mg Xr24h-cap (Diltiazem hcl er beads) .[name]... Take one capsule by mouth once a day  Labetalol Hcl 200 Mg Tabs (Labetalol hcl) ..... One tab in am and 2 in pm  Per Dr Antoine Poche  No Angina or CHF.  Euvolemic.  needs weight loss  Last Heart Function was 40-48% @ 2008  repeat ECHO 04/2042 c CVA reviewed. EF 55%? - hard to read the report.  Problem # 6: CORONARY ARTERY DISEASE (ICD-414.00) (ICD10-I25.10)  His updated medication list for this problem includes:  Aspirin 325 Mg Tabs (Aspirin) .[name]... 1 qd  Spironolactone 25 Mg Tabs (Spironolactone) .[name]... 1 qd  Lisinopril 40 Mg Tabs (Lisinopril) .[name]... Take one by mouth every day  Hydrochlorothiazide 25 Mg Tabs (Hydrochlorothiazide) .[name]... Take one tablet by mouth once a day  Diltiazem Hcl Er Beads 180 Mg Xr24h-cap (Diltiazem hcl er beads) .[name]... Take one capsule by mouth once a day  Labetalol Hcl 200 Mg Tabs (Labetalol hcl) ..... One tab in am and 2 in pm  CAD/Ischemic CM/Myocardial infarction (1987)/CBG 4 v CABG - No Angina or DOE - Stable  BP fine - Down to 2 Labetolol per day.  Known ischemic heart disease and had an anteroseptal myocardial infarction in 1987. He subsequent had coronary artery bypass graft in 1993. His last stress Cardiolite study was in June of 2008. He an ejection fraction of 40% at that time with mild inferior hypokinesis possible apical hypokinesis  Heart doing fine.  No CP or angina.  Saw Cards June 14. Junctional Rhythm with PACs and Lebatolol was Decreased.  Problem # 7: DIABETES MELLITUS, TYPE II, WITH NEUROLOGICAL COMPLICATIONS (ICD-250.60)  (ICD10-E11.40)  His updated medication list for this problem includes:  Aspirin 325 Mg Tabs (Aspirin) .[name]... 1 qd  Invokana 100 Mg Tabs (Canagliflozin) ..... One po daily.  Levemir Flexpen 100 Unit/ml Soln (Insulin detemir) ..... Inject 45 u a day  Metformin Hcl 1000 Mg Tabs (Metformin hcl) .[name]... Take one by mouth twice a day  Lisinopril 40 Mg Tabs (Lisinopril) .[name]... Take one by mouth every day  Amaryl 4 Mg Tabs (Glimepiride) .[name]... 1 po bid  Byetta 10 Mcg Pen 10 Mcg/0.58ml Soln (Exenatide) ..... Inject 10 mcg bid [place]on  Theodis Sato has worked very well and when uses daily it is helpful. I gave samples.  get back to losing.  Hgb A1C: 8.1  Off Innvokana and A1C went up some.  B/P: 110/61  Eye Examination: 04/27/12 check w/ Dr.SNipes-possible stroke that affected visionper pt (04/27/2012 4:12:18 PM)  Foot Exam: normal sensation skin intact normal monofilament. callous/wound better  LDL: 114  Microalbumin/Creatinine: 48.9 MG/DL, 16.1 MG/DL  GFR if AFA: 09.6 (?)  GFI if not AFA: 60.4 (?)  Levimir 45 HS.  Will continue Invokana 300 daily. samples given. He will cut in 1/2  Problem # 8: HYPERLIPIDEMIA (ICD-272.4) (ICD10-E78.5)  His updated medication list for this problem includes:  Lipitor 80 Mg Tabs (Atorvastatin calcium) .[name]... 1 po qd  TC 206/TG 198/hdl 42/ldl 124 - still off - needs weight off as we cannot go higher on dosing.  Zetia??  Labs Reviewed:  Chol: 205 (07/07/2012) HDL: 38 (10/24/2008) LDL: 045 (07/07/2012)  SGOT: 21 (07/07/2012) SGPT: 23 (07/07/2012)  Problem # 9: HYPERTENSION (ICD-401.9) (ICD10-I10)  His updated medication list for this problem includes:  Spironolactone 25 Mg Tabs (Spironolactone) .[name]... 1 qd  Lisinopril 40 Mg Tabs (Lisinopril) .[name]... Take one  by mouth every day  Hydrochlorothiazide 25 Mg Tabs (Hydrochlorothiazide) .[name]... Take one tablet by mouth once a day  Diltiazem Hcl Er Beads 180 Mg Xr24h-cap (Diltiazem hcl er beads) .[name]... Take one capsule by mouth once a day   Labetalol Hcl 200 Mg Tabs (Labetalol hcl) ..... One tab in am and 2 in pm  BP today: 110/61  Prior BP: 110/60 (09/14/2012)  Labs Reviewed:  Creat: 1.2 (07/07/2012)  Chol: 205 (07/07/2012) HDL: 38 (10/24/2008) LDL: 161 (07/07/2012)  Problem # 10: SLEEP APNEA, OBSTRUCTIVE (ICD-327.23) (WRU04-V40.98)  Dr Maple Hudson in September.  Mask/CPAP working welll and called his friend.  Problem # 11: OSTEOARTHRITIS (ICD-715.90) (ICD10-M19.90)  R ankle, L knee, back, Shoulder pains  Living with it  His updated medication list for this problem includes:  Aspirin 325 Mg Tabs (Aspirin) .[name]... 1 qd  Problem # 12: FATTY LIVER DISEASE (ICD-571.8) (ICD10-K76.0)  LFTs are fine.  Complete Medication List:  1) Aspirin 325 Mg Tabs (Aspirin) .[name].. 1 qd  2) Invokana 100 Mg Tabs (Canagliflozin) .... One po daily.  3) Onetouch Ultra Blue Strp (Glucose blood) .... Use 1 strip 4 times per day to test blood sugar dx: 250.60  4) Levemir Flexpen 100 Unit/ml Soln (Insulin detemir) .... Inject 45 u a day  5) Spironolactone 25 Mg Tabs (Spironolactone) .[name].. 1 qd  6) Viagra 100 Mg Tabs (Sildenafil citrate) .... Use 1 po 1 hour prior. as directed  7) Accu-chek Comfort Curve Strp (Glucose blood) .... Use as directed qid  8) Px Extra Short Pen Needles 31g X 6 Mm Misc (Insulin pen needle) .... Use as directed 2 qd  9) Accu-chek Aviva Strp (Glucose blood) .... Use 1 strip 4 x daily  10) Glucosamine-chondroitin-msm Tabs (Glucosamine-chondroitin-msm tabs) .... Take one tablet by mouth every day  11) Metformin Hcl 1000 Mg Tabs (Metformin hcl) .... Take one by mouth twice a day  12) Lisinopril 40 Mg Tabs (Lisinopril) .... Take one by mouth every day  13) Lipitor 80 Mg Tabs (Atorvastatin calcium) .[name].. 1 po qd  14) Hydrochlorothiazide 25 Mg Tabs (Hydrochlorothiazide) .... Take one tablet by mouth once a day  15) Amaryl 4 Mg Tabs (Glimepiride) .[name].. 1 po bid  16) Diltiazem Hcl Er Beads 180 Mg Xr24h-cap (Diltiazem hcl er beads) .... Take one  capsule by mouth once a day  17) Coq10 100 Mg Caps (Coenzyme q10) .... Take three capsules by mouth every day  18) Multivitamins Tabs (Multiple vitamin) .... Take one tablet by mouth once a day  19) Labetalol Hcl 200 Mg Tabs (Labetalol hcl) .... One tab in am and 2 in pm  20) Byetta 10 Mcg Pen 10 Mcg/0.79ml Soln (Exenatide) .... Inject 10 mcg bid   21) Bd Pen Needle Ultrafine 29g X 12.34mm Misc (Insulin pen needle) .... 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  His updated medication list for this problem includes:  Aspirin 325 Mg Tabs (Aspirin) .[name]... 1 qd  Spironolactone 25 Mg Tabs (Spironolactone) .[name]... 1 qd  Lisinopril 40 Mg Tabs (Lisinopril) .[name]... Take one by mouth every day  Hydrochlorothiazide 25 Mg Tabs (Hydrochlorothiazide) .[name]... Take one tablet by mouth once a day  Diltiazem  Hcl Er Beads 180 Mg Xr24h-cap (Diltiazem hcl er beads) .[name]... Take one capsule by mouth once a day  Labetalol Hcl 200 Mg Tabs (Labetalol hcl) ..... One tab in am and 2 in pm  Problem # 14: OBESITY (ICD-278.00) (ICD10-E66.9)  Obesity. - wt fluctuated 320-346 over last 4 yrs.  Walk, Less Calories and better Calories. Needs weight off.  Counseled on diet and exercise with a recommendation of limiting trans and saturated fats, increasing daily servings of fruits and vegetables, and engaging in 150 minutes or more of moderate intensity physical activity weekly.  Invokana did help initially  Problem # 15: HERNIA, VENTRAL (ICD-553.20) (ICD10-K43.9)  no Change. needs wt loss  Surgery not needed  Problem # 16: VENOUS INSUFFICIENCY, CHRONIC (ICD-459.81) (ICD10-I87.2)  H/O Venous Stasis/ CVI - not much LE Edema today  Wears Stocking  daily  S/P L Saph vein ablation and R is upcoming.  Dr Hart Rochester.  Other Orders:  Immunization Admin [id number])  Prevnar [id number])  Digital Rectal Exam Prostate CA Screen (CPT-G0102)  Prevnar 13 Intramuscular Suspension ([id number])  Colonoscopy (COLON)  Vaccines Administered/Entered:  Vaccination Group: Pneumococcal PCV13  Series: 1  Vaccination: Prevnar 13 Intramuscular Suspension  Administered Date: 11/24/2012 11:55 AM  VFC Eligibility: Not VFC Eligible  Dose/Route/Site : 0.5 ml / IM / Left Deltoid  Mfr/Lot#/Exp.Date: Atlee Abide / [id number] / 08/17/2013  VIS Date : 06/16/2011  Administered by : Antonieta Pert CMA, Melissa  Comments : Pt tolerated well  Electronically signed by Gwen Pounds MD on 11/24/2012 at 5:38 PM

## 2013-01-08 NOTE — Consult Note (Signed)
PULMONARY  / CRITICAL CARE MEDICINE  Name: Kerry Waters MRN: [id number] DOB: 08/11/1945    ADMISSION DATE:  01/07/2013 CONSULTATION DATE:  01/08/2013  REFERRING MD :  Dr. Timothy Lasso PRIMARY SERVICE: Internal Medicine  CHIEF COMPLAINT:  Confusion  BRIEF PATIENT DESCRIPTION: 67 year-old male with multiple medical problems some of the highlights of which include CAD, diabetes, hypertension, morbid obesity and sleep apnea who presents with confusion in the setting of cellulitis and elevated troponin.  SIGNIFICANT EVENTS / STUDIES:  1. BNP 17379.0 2. Trop 2.70 3. WBC 14.6 (91% N) 4. D-Dimer 2.66  LINES / TUBES: 1. PIV  CULTURES: 1. None  ANTIBIOTICS: 1. Vancomycin 9/21- 2. Zosyn 9/21  HISTORY OF PRESENT ILLNESS:  Mr. [name] is a 67 year old male with innumerable medical problems some of the highlights of which include CAD status post MI, diabetes, hypertension, morbid obesity, and sleep apnea who presented to [facility] on 9/21 with confusion and difficulty with ambulation. His wife and daughter insisted he present to the hospital after they found him "just not like himself" with an inability to communicate his needs, incontinence, and a fall last night. He notes that he has had increasing "infection" of the right lower shotty over the last 3 weeks. He has not seen a doctor for it. He denies recent  chills, headaches, neck stiffness, nausea, vomiting, chest pain, cough, or other rash. He did have a fever yesterday.  PAST MEDICAL HISTORY :  Past Medical History  Diagnosis Date  . Joint pain   . Ulcer of foot   . Ischemic heart disease   . MI (myocardial infarction) 1987    ANTERIOR SEPTAL  . Hypokinesis     MILD INFERIOR  . Hypertension   . Edema of lower extremity   . Obesities, morbid   . SVT (supraventricular tachycardia)   . Hx of CABG 1993  . Abnormal cardiovascular stress test 2008    EF 48%. No clear cut ischemia. Low risk scan  . Diabetes mellitus     Dr. Timothy Lasso  follows.  [name] PVD (peripheral vascular disease)   . Venous stasis   . Peripheral neuropathy   . Hyperlipidemia   . Coronary artery disease   . Sleep apnea     CPAP  . History of angina   . Cancer     basil cell carcinoma  . Stroke     pt states possible stroke on 04/25/2012    Past Surgical History  Procedure Laterality Date  . Coronary artery bypass graft  1993    LIMA to LAD with patch angioplasty, SVG to OM, SVG to OM & PD and patch angioplasy to PDA  . Left ankle orif  2000    Prior to Admission medications   Medication Sig Start Date End Date Taking? Authorizing Provider  aspirin EC 325 MG tablet Take 325 mg by mouth daily.   Yes Historical Provider, MD  atorvastatin (LIPITOR) 80 MG tablet Take 80 mg by mouth daily.   Yes Historical Provider, MD  Canagliflozin (INVOKANA) 100 MG TABS Take 100 mg by mouth daily.    Yes Historical Provider, MD  clopidogrel (PLAVIX) 75 MG tablet Take 75 mg by mouth daily.   Yes Historical Provider, MD  Coenzyme Q10 300 MG CAPS Take 1 capsule by mouth daily.     Yes Historical Provider, MD  diltiazem (CARDIZEM CD) 180 MG 24 hr capsule Take 180 mg by mouth 2 (two) times daily.   Yes Historical Provider, MD  Exenatide (BYETTA 5 MCG PEN [place]) Inject 10 mg into the skin 2 (two) times daily.    Yes Historical Provider, MD  glimepiride (AMARYL) 4 MG tablet Take 4 mg by mouth 2 (two) times daily. Take 1 in am and 1 in pm   Yes Historical Provider, MD  hydrochlorothiazide (HYDRODIURIL) 25 MG tablet Take 25 mg by mouth daily.   Yes Historical Provider, MD  ibuprofen (ADVIL,MOTRIN) 200 MG tablet Take 200 mg by mouth every 6 (six) hours as needed for pain.   Yes Historical Provider, MD  insulin detemir (LEVEMIR) 100 UNIT/ML injection Inject 45 Units into the skin at bedtime.   Yes Historical Provider, MD  labetalol (NORMODYNE) 200 MG tablet Take 200 mg by mouth 2 (two) times daily.    Yes Historical Provider, MD  lisinopril (PRINIVIL,ZESTRIL) 40 MG tablet Take  40 mg by mouth daily.   Yes Historical Provider, MD  metFORMIN (GLUCOPHAGE) 1000 MG tablet Take 1,000 mg by mouth 2 (two) times daily with a meal.   Yes Historical Provider, MD  Multiple Vitamin (MULTIVITAMIN WITH MINERALS) TABS tablet Take 1 tablet by mouth daily.   Yes Historical Provider, MD  Multiple Vitamin (MULTIVITAMIN) tablet Take 1 tablet by mouth daily.     Yes Historical Provider, MD  naproxen sodium (ANAPROX) 220 MG tablet Take 440 mg by mouth daily as needed (for pain).   Yes Historical Provider, MD  spironolactone (ALDACTONE) 25 MG tablet Take 25 mg by mouth daily.   Yes Historical Provider, MD  vitamin C (ASCORBIC ACID) 500 MG tablet Take 500 mg by mouth daily.   Yes Historical Provider, MD  Zinc 40 MG TABS Take 40 mg by mouth daily.    Yes Historical Provider, MD  cefdinir (OMNICEF) 300 MG capsule Take 300 mg by mouth 2 (two) times daily. Started 01/07/13, for 10 days, ending 01/16/13    Historical Provider, MD    No Known Allergies  FAMILY HISTORY:  Family History  Problem Relation Age of Onset  . Heart attack Father   . Heart disease Father     before age 17  . Cancer Mother     stomach cancer  . Heart attack Maternal Grandfather   . Heart attack Paternal Grandfather   . Cancer Sister     breast  . Diabetes Son     SOCIAL HISTORY:  reports that he quit smoking about 27 years ago. His smoking use included Cigarettes. He has a 30 pack-year smoking history. He has never used smokeless tobacco. He reports that he does not drink alcohol or use illicit drugs.  REVIEW OF SYSTEMS:  A 12 system review of systems was conducted and, unless otherwise specified in the history of present illness, was negative.  PHYSICAL EXAM  VITAL SIGNS: Temp:  [99.1 F (37.3 C)] 99.1 F (37.3 C) (09/21 1746) Pulse Rate:  [74-101] 101 (09/21 2245) Resp:  [20-35] 24 (09/21 2245) BP: (100-122)/(47-63) 115/58 mmHg (09/21 2245) SpO2:  [90 %-99 %] 96 % (09/21 2245)  HEMODYNAMICS:     VENTILATOR SETTINGS:    INTAKE / OUTPUT: Intake/Output   None     PHYSICAL EXAMINATION: General:  Morbidly obese male in no acute distress Neuro:  Grossly intact HEENT:   sclera anicteric, conjunctiva pink. Mucous membranes moist, oropharynx clear. Neck:  Trachea supple in midline, no lymphadenopathy or JVD Cardiovascular:  regular rate rhythm, normal S1-S2, no murmurs rubs or gallops Lungs:  Difficult examination secondary to patient body habitus, no gross rales  or wheezes Abdomen:   obese, nontender, nondistended, positive bowels Musculoskeletal:  No clubbing or cyanosis; no edema of the left leg. 1+ edema to the knee on the right  Skin:  Erythema throughout the right leg to the proximal tibia. Chronic venous stasis changes on the left.   LABS:  CBC Recent Labs     01/07/13  1940  WBC  14.6*  HGB  13.3  HCT  37.1*  PLT  126*    Coag's No results found for this basename: APTT, INR,  in the last 72 hours  BMET Recent Labs     01/07/13  1940  NA  129*  K  4.3  CL  93*  CO2  20  BUN  25*  CREATININE  1.39*  GLUCOSE  239*    Electrolytes Recent Labs     01/07/13  1940  CALCIUM  8.9    Sepsis Markers No results found for this basename: LACTICACIDVEN, PROCALCITON, O2SATVEN,  in the last 72 hours  ABG No results found for this basename: PHART, PCO2ART, PO2ART,  in the last 72 hours  Liver Enzymes Recent Labs     01/07/13  1940  AST  35  ALT  22  ALKPHOS  58  BILITOT  0.8  ALBUMIN  3.1*    Cardiac Enzymes Recent Labs     01/07/13  1940  01/07/13  2126  TROPONINI  2.32*  2.70*  PROBNP  17379.0*   --     Glucose No results found for this basename: GLUCAP,  in the last 72 hours  Imaging Dg Chest 2 View  01/07/2013   CLINICAL DATA:  Chest pain status post fall. History of hypertension and diabetes.  EXAM: CHEST  2 VIEW  COMPARISON:  06/02/2010.  FINDINGS: The heart size and mediastinal contours are stable allowing for lordotic positioning.  Patient is status post CABG. The upper sternotomy wires are fractured. The lungs are clear. There is no pleural effusion or pneumothorax. Telemetry leads overlie the chest. Mild thoracic spine degenerative changes are stable.  IMPRESSION: Stable postoperative chest. No acute cardiopulmonary process.   Electronically Signed   By: Roxy Horseman   On: 01/07/2013 19:24   Dg Pelvis 1-2 Views  01/07/2013   CLINICAL DATA:  Joint pain status post fall.  EXAM: PELVIS - 1-2 VIEW  COMPARISON:  None.  FINDINGS: The mineralization and alignment are normal. There is no evidence of acute fracture or dislocation. The hip joint spaces are preserved. Scattered vascular calcifications are noted.  IMPRESSION: No acute osseous findings.   Electronically Signed   By: Roxy Horseman   On: 01/07/2013 19:19   Ct Head Wo Contrast  01/07/2013   CLINICAL DATA:  Confusion; recent trauma; fever  EXAM: CT HEAD WITHOUT CONTRAST  TECHNIQUE: Contiguous axial images were obtained from the base of the skull through the vertex without intravenous contrast. Study was obtained within 24 hr of patient's arrival at the emergency department.  COMPARISON:  May 23, 2012  FINDINGS: Ventricles are normal in size and configuration. There is no mass, hemorrhage, extra-axial fluid collection, or midline shift. There is evidence of old infarct in the medial right occipital lobe. Elsewhere, gray-white compartments are normal. There is no demonstrable acute infarct.  Bony calvarium appears intact. The mastoid air cells are clear.  IMPRESSION: Remote medial aspect right occipital lobe infarct. Study otherwise unremarkable. In particular, there is no appreciable mass, hemorrhage, or acute appearing infarct.   Electronically Signed   By:  Bretta Bang   On: 01/07/2013 19:26    EKG: No acute ST changes. CXR: Chest x-ray from today was personally reviewed by me. It is essentially normal, however, limited by patient body habitus.  ASSESSMENT /  PLAN: Active Problems:   Cellulitis   Sepsis   Elevated troponin   Hyponatremia   Hyperglycemia   Elevated brain natriuretic peptide (BNP) level   Elevated lactic acid level   Confusion   PULMONARY A: 1. Mild tachypnea: This is likely secondary to the patient's minimal acidosis. While he denies shortness of breath he does appear slightly shortness of breath on examination. 2. Sleep apnea  P:    Agree with lower extremity Dopplers; would also suggest VQ scan  Will defer anticoagulation at decision to cardiology, however, it seems reasonable especially while the troponin is climbing  CPAP with a pressure of 19  CARDIOVASCULAR A:  1. Suspected demand ischemia: It is less clear to me that Mr. Canipe significant troponin leak is entirely secondary to sepsis. The cellulitis appears mild. I suspect that he has a significant degree of underlying coronary disease. In addition, an MI without chest pain is reasonable to suspect in a severe diabetic.  2. Acute heart failure:  3. Suspected severe sepsis: Mr. Netherton does meet at least 2 surgical criteria and the setting of infection and severe sepsis is a reasonable consideration.  P:   Defer management of MI and heart failure to cardiology  Antibiotics per below; would not add additional fluid at this time  RENAL A:  1. Acute kidney injury: Suspect this is secondary to either sepsis or acute fluid overload  P:    Serial creatinine  GASTROINTESTINAL A:   No acute issue   HEMATOLOGIC A:    No Acute Issue  INFECTIOUS A: 1. Right lower extremity  cellulitis:   P:    Agree with vancomycin and Zosyn, however, would consider switching to vancomycin and meropenem to reduce likelihood of propagating renal injury  ENDOCRINE A:   1. Diabetes:   P:    Defer to PCP   NEUROLOGIC A:  1. Altered mental status: There is no evidence of meningitis at this time. I suspect this is secondary to the patient's ongoing sepsis  versus acute myocardial infarction.  P:    Defer workup to primary care physician   TODAY'S SUMMARY:   I have personally obtained a history, examined the patient, evaluated laboratory and imaging results, formulated the assessment and plan and placed orders.  Evalyn Casco, MD [facility] Pager: [phone]  01/08/2013, 12:09 AM

## 2013-01-08 NOTE — Progress Notes (Signed)
Echocardiogram 2D Echocardiogram with Definity has been performed.  Kerry Waters 01/08/2013, 2:17 PM

## 2013-01-08 NOTE — Progress Notes (Signed)
Utilization review completed.  

## 2013-01-09 DIAGNOSIS — I739 Peripheral vascular disease, unspecified: Secondary | ICD-10-CM

## 2013-01-09 DIAGNOSIS — L97909 Non-pressure chronic ulcer of unspecified part of unspecified lower leg with unspecified severity: Secondary | ICD-10-CM

## 2013-01-09 LAB — CBC WITH DIFFERENTIAL/PLATELET
Eosinophils Absolute: 0.1 10*3/uL (ref 0.0–0.7)
Eosinophils Relative: 1 % (ref 0–5)
HCT: 33 % — ABNORMAL LOW (ref 39.0–52.0)
Hemoglobin: 11.6 g/dL — ABNORMAL LOW (ref 13.0–17.0)
Lymphocytes Relative: 9 % — ABNORMAL LOW (ref 12–46)
Lymphs Abs: 0.9 10*3/uL (ref 0.7–4.0)
MCV: 93.5 fL (ref 78.0–100.0)
Monocytes Relative: 7 % (ref 3–12)
Neutro Abs: 8.2 10*3/uL — ABNORMAL HIGH (ref 1.7–7.7)
Neutrophils Relative %: 83 % — ABNORMAL HIGH (ref 43–77)
Platelets: 118 10*3/uL — ABNORMAL LOW (ref 150–400)
RBC: 3.53 MIL/uL — ABNORMAL LOW (ref 4.22–5.81)
RDW: 15.2 % (ref 11.5–15.5)
WBC: 9.9 10*3/uL (ref 4.0–10.5)

## 2013-01-09 LAB — COMPREHENSIVE METABOLIC PANEL
AST: 45 U/L — ABNORMAL HIGH (ref 0–37)
BUN: 24 mg/dL — ABNORMAL HIGH (ref 6–23)
CO2: 22 mEq/L (ref 19–32)
Calcium: 8.7 mg/dL (ref 8.4–10.5)
Chloride: 97 mEq/L (ref 96–112)
Creatinine, Ser: 1.31 mg/dL (ref 0.50–1.35)
GFR calc non Af Amer: 55 mL/min — ABNORMAL LOW (ref >90)
Total Bilirubin: 0.6 mg/dL (ref 0.3–1.2)
Total Protein: 6.5 g/dL (ref 6.0–8.3)

## 2013-01-09 LAB — GLUCOSE, CAPILLARY
Glucose-Capillary: 157 mg/dL — ABNORMAL HIGH (ref 70–99)
Glucose-Capillary: 153 mg/dL — ABNORMAL HIGH (ref 70–99)

## 2013-01-09 NOTE — Progress Notes (Signed)
Placed pt. On CPAP of 19cm H2O via FFM with 2L O2 bled in. Pt. Is tolerating CPAP well at this time without any complications.

## 2013-01-09 NOTE — Progress Notes (Signed)
SUBJECTIVE:  The patient is much more awake and alert today. He denies any chest discomfort or shortness of breath.   PHYSICAL EXAM Filed Vitals:   01/08/13 2328 01/08/13 2335 01/09/13 0435 01/09/13 0736  BP: 116/72  96/47 111/54  Pulse: 82 80 62 64  Temp: 100.6 F (38.1 C)  97.8 F (36.6 C) 99.9 F (37.7 C)  TempSrc: Axillary  Axillary Oral  Resp: 25 20 19 21   Height:      Weight:   332 lb 10.8 oz (150.9 kg)   SpO2: 94% 94% 98% 96%   General:  No distress Lungs:  Few expiratory wheezes Heart:  RRR Abdomen:  Positive bowel sounds, no rebound no guarding Extremities:  Right greater than left leg edema.    LABS: Lab Results  Component Value Date   TROPONINI 2.57* 01/08/2013   Results for orders placed during the hospital encounter of 01/07/13 (from the past 24 hour(s))  GLUCOSE, CAPILLARY     Status: Abnormal   Collection Time    01/08/13 12:17 PM      Result Value Range   Glucose-Capillary 196 (*) 70 - 99 mg/dL   Comment 1 Notify RN     Comment 2 Documented in Chart    GLUCOSE, CAPILLARY     Status: Abnormal   Collection Time    01/08/13  5:41 PM      Result Value Range   Glucose-Capillary 167 (*) 70 - 99 mg/dL   Comment 1 Notify RN     Comment 2 Documented in Chart    GLUCOSE, CAPILLARY     Status: Abnormal   Collection Time    01/08/13 10:04 PM      Result Value Range   Glucose-Capillary 265 (*) 70 - 99 mg/dL  COMPREHENSIVE METABOLIC PANEL     Status: Abnormal   Collection Time    01/09/13  4:55 AM      Result Value Range   Sodium 132 (*) 135 - 145 mEq/L   Potassium 3.9  3.5 - 5.1 mEq/L   Chloride 97  96 - 112 mEq/L   CO2 22  19 - 32 mEq/L   Glucose, Bld 148 (*) 70 - 99 mg/dL   BUN 24 (*) 6 - 23 mg/dL   Creatinine, Ser 1.61  0.50 - 1.35 mg/dL   Calcium 8.7  8.4 - 09.6 mg/dL   Total Protein 6.5  6.0 - 8.3 g/dL   Albumin 2.6 (*) 3.5 - 5.2 g/dL   AST 45 (*) 0 - 37 U/L   ALT 21  0 - 53 U/L   Alkaline Phosphatase 63  39 - 117 U/L   Total Bilirubin 0.6   0.3 - 1.2 mg/dL   GFR calc non Af Amer 55 (*) >90 mL/min   GFR calc Af Amer 63 (*) >90 mL/min  CBC WITH DIFFERENTIAL     Status: Abnormal   Collection Time    01/09/13  4:55 AM      Result Value Range   WBC 9.9  4.0 - 10.5 K/uL   RBC 3.53 (*) 4.22 - 5.81 MIL/uL   Hemoglobin 11.6 (*) 13.0 - 17.0 g/dL   HCT 04.5 (*) 40.9 - 81.1 %   MCV 93.5  78.0 - 100.0 fL   MCH 32.9  26.0 - 34.0 pg   MCHC 35.2  30.0 - 36.0 g/dL   RDW 91.4  78.2 - 95.6 %   Platelets 118 (*) 150 - 400 K/uL   Neutrophils  Relative % 83 (*) 43 - 77 %   Neutro Abs 8.2 (*) 1.7 - 7.7 K/uL   Lymphocytes Relative 9 (*) 12 - 46 %   Lymphs Abs 0.9  0.7 - 4.0 K/uL   Monocytes Relative 7  3 - 12 %   Monocytes Absolute 0.7  0.1 - 1.0 K/uL   Eosinophils Relative 1  0 - 5 %   Eosinophils Absolute 0.1  0.0 - 0.7 K/uL   Basophils Relative 0  0 - 1 %   Basophils Absolute 0.0  0.0 - 0.1 K/uL    Intake/Output Summary (Last 24 hours) at 01/09/13 0847 Last data filed at 01/09/13 0800  Gross per 24 hour  Intake   1643 ml  Output   1000 ml  Net    643 ml    EKG:  Sinus tachycardia, no acute ST T wave changes.  01/09/2013  ASSESSMENT AND PLAN:  ELEVATED TROPONIN:  History of CAD.  EF 25% on echo.   I will check a MUGA as his acoustic windows were very poor and this would reflect a significant change in his ejection fraction.  The patient may of had some increasing dyspnea but he's had no chest discomfort.  HTN:  His blood pressure is well controlled. I may further titrate his medications in the future particularly with increasing his ACE inhibitor given potentially reduced ejection fraction compared with previous.  Fayrene Fearing [facility] 01/09/2013 8:47 AM

## 2013-01-09 NOTE — Progress Notes (Signed)
Pt exceeds weight limit for MUGA. D/w Dr. Patty Sermons. We will defer to Dr. Antoine Poche tomorrow in rounds to decide regarding further eval of LV function. Darsha Zumstein PA-C

## 2013-01-09 NOTE — Consult Note (Signed)
WOC consult Note Reason for Consult: Profore application after MRI. Wound type: Venous stasis Dressing procedure/placement/frequency:  Unable to apply Profore today as MRI has not been completed at this time. Will re-evaluate for Profore application in the AM.  Maple Hudson RN BSN [facility] Pager [phone]

## 2013-01-09 NOTE — Progress Notes (Signed)
Subjective: F/up of  fever, Confusion, RLE Cellulitis, Toe Ulcer, Leukocytosis, Elevated Trop I and Sepsis In Stepdown on IV Abx. Mentally better. AVSS. #s improving. No new c/o  Objective: Vital signs in last 24 hours: Temp:  [97.8 F (36.6 C)-100.6 F (38.1 C)] 97.8 F (36.6 C) (09/23 0435) Pulse Rate:  [62-91] 62 (09/23 0435) Resp:  [19-25] 19 (09/23 0435) BP: (96-132)/(42-74) 96/47 mmHg (09/23 0435) SpO2:  [94 %-98 %] 98 % (09/23 0435) Weight:  [150.9 kg (332 lb 10.8 oz)] 150.9 kg (332 lb 10.8 oz) (09/23 0435) Weight change: -1.4 kg (-3 lb 1.4 oz)    CBG (last 3)   Recent Labs  01/08/13 1217 01/08/13 1741 01/08/13 2204  GLUCAP 196* 167* 265*    Intake/Output from previous day:  Intake/Output Summary (Last 24 hours) at 01/09/13 0740 Last data filed at 01/09/13 0600  Gross per 24 hour  Intake   1463 ml  Output   1000 ml  Net    463 ml   09/22 0701 - 09/23 0700 In: 1463 [I.V.:363; IV Piggyback:1100] Out: 1000 [Urine:1000]   Physical Exam General appearance: Obese. Alert.  Back to his normal. Throat: oropharynx moist without erythema Resp: More clear Cardio: Reg. Sternotomy. HR 60s occ PVCs GI: Obese. soft, non-tender; bowel sounds normal; no masses,  no organomegaly Extremities:  Less Edema.  CVI LLE.  RLE c sig Cellulitis to below knee.  R Great toe Wrapped up.  Lab Results:  Recent Labs  01/08/13 0350 01/09/13 0455  NA 130* 132*  K 3.9 3.9  CL 94* 97  CO2 23 22  GLUCOSE 218* 148*  BUN 26* 24*  CREATININE 1.40* 1.31  CALCIUM 8.8 8.7     Recent Labs  01/08/13 0350 01/09/13 0455  AST 43* 45*  ALT 22 21  ALKPHOS 69 63  BILITOT 0.7 0.6  PROT 6.8 6.5  ALBUMIN 3.0* 2.6*     Recent Labs  01/08/13 0350 01/09/13 0455  WBC 15.1* 9.9  NEUTROABS 13.4* 8.2*  HGB 12.8* 11.6*  HCT 36.2* 33.0*  MCV 94.5 93.5  PLT 130* 118*    No results found for this basename: INR,  PROTIME     Recent Labs  01/07/13 1940 01/07/13 2126  01/08/13 0350  TROPONINI 2.32* 2.70* 2.57*    No results found for this basename: TSH, T4TOTAL, FREET3, T3FREE, THYROIDAB,  in the last 72 hours  No results found for this basename: VITAMINB12, FOLATE, FERRITIN, TIBC, IRON, RETICCTPCT,  in the last 72 hours  Micro Results: Recent Results (from the past 240 hour(s))  MRSA PCR SCREENING     Status: Abnormal   Collection Time    01/08/13  6:21 AM      Result Value Range Status   MRSA by PCR POSITIVE (*) NEGATIVE Final   Comment:            The GeneXpert MRSA Assay (FDA     approved for NASAL specimens     only), is one component of a     comprehensive MRSA colonization     surveillance program. It is not     intended to diagnose MRSA     infection nor to guide or     monitor treatment for     MRSA infections.     RESULT CALLED TO, READ BACK BY AND VERIFIED WITH:     H MILLS,RN 9/22 0747 BY K SCHULTZ     Studies/Results: Dg Chest 2 View  01/07/2013  CLINICAL DATA:  Chest pain status post fall. History of hypertension and diabetes.  EXAM: CHEST  2 VIEW  COMPARISON:  06/02/2010.  FINDINGS: The heart size and mediastinal contours are stable allowing for lordotic positioning. Patient is status post CABG. The upper sternotomy wires are fractured. The lungs are clear. There is no pleural effusion or pneumothorax. Telemetry leads overlie the chest. Mild thoracic spine degenerative changes are stable.  IMPRESSION: Stable postoperative chest. No acute cardiopulmonary process.   Electronically Signed   By: Roxy Horseman   On: 01/07/2013 19:24   Dg Pelvis 1-2 Views  01/07/2013   CLINICAL DATA:  Joint pain status post fall.  EXAM: PELVIS - 1-2 VIEW  COMPARISON:  None.  FINDINGS: The mineralization and alignment are normal. There is no evidence of acute fracture or dislocation. The hip joint spaces are preserved. Scattered vascular calcifications are noted.  IMPRESSION: No acute osseous findings.   Electronically Signed   By: Roxy Horseman   On:  01/07/2013 19:19   Ct Head Wo Contrast  01/07/2013   CLINICAL DATA:  Confusion; recent trauma; fever  EXAM: CT HEAD WITHOUT CONTRAST  TECHNIQUE: Contiguous axial images were obtained from the base of the skull through the vertex without intravenous contrast. Study was obtained within 24 hr of patient's arrival at the emergency department.  COMPARISON:  May 23, 2012  FINDINGS: Ventricles are normal in size and configuration. There is no mass, hemorrhage, extra-axial fluid collection, or midline shift. There is evidence of old infarct in the medial right occipital lobe. Elsewhere, gray-white compartments are normal. There is no demonstrable acute infarct.  Bony calvarium appears intact. The mastoid air cells are clear.  IMPRESSION: Remote medial aspect right occipital lobe infarct. Study otherwise unremarkable. In particular, there is no appreciable mass, hemorrhage, or acute appearing infarct.   Electronically Signed   By: Bretta Bang   On: 01/07/2013 19:26   Dg Foot Complete Right  01/08/2013   CLINICAL DATA:  Ulcer right great toe, diabetes, question osteomyelitis  EXAM: RIGHT FOOT COMPLETE - 3+ VIEW  COMPARISON:  02/24/2012 great toe radiographs, right foot radiographs 06/02/2010  FINDINGS: Dressing artifacts at great toe.  Diffuse osseous demineralization.  Joint spaces preserved.  Soft tissue swelling greatest at dorsum of foot.  Cortices at the phalanges of the great toe appear intact.  No fracture, dislocation or bone destruction.  Specifically no definite evidence of bone destruction at the great toe to suggest osteomyelitis.  Small subchondral cyst is noted at the base of the middle phalanx of the 2nd toe.  Scattered atherosclerotic calcification at ankle.  IMPRESSION: No definite evidence of osteomyelitis at the great toe. Significant soft tissue swelling greatest at dorsum of right foot.  If there is persistent clinical concern for osteomyelitis, then recommend MR imaging of the right foot  with and without contrast for further evaluation.   Electronically Signed   By: Ulyses Southward M.D.   On: 01/08/2013 13:10     Medications: Scheduled: . aspirin EC  325 mg Oral Daily  . atorvastatin  80 mg Oral Daily  . Chlorhexidine Gluconate Cloth  6 each Topical Q0600  . clopidogrel  75 mg Oral Q breakfast  . diltiazem  180 mg Oral BID  . enoxaparin (LOVENOX) injection  150 mg Subcutaneous Q12H  . insulin aspart  0-15 Units Subcutaneous TID WC  . insulin aspart  0-5 Units Subcutaneous QHS  . insulin aspart  3 Units Subcutaneous TID WC  . insulin detemir  45 Units Subcutaneous QHS  . labetalol  200 mg Oral BID  . lisinopril  10 mg Oral BID  . multivitamin with minerals  1 tablet Oral Daily  . mupirocin ointment  1 application Nasal BID  . piperacillin-tazobactam (ZOSYN)  IV  3.375 g Intravenous Q8H  . saccharomyces boulardii  250 mg Oral BID  . sodium chloride  3 mL Intravenous Q12H  . spironolactone  25 mg Oral Daily  . vancomycin  1,500 mg Intravenous Q12H   Continuous: . sodium chloride 30 mL/hr at 01/08/13 2300     Assessment/Plan: Active Problems:   Cellulitis   Sepsis   Elevated troponin   Hyponatremia   Hyperglycemia   Elevated brain natriuretic peptide (BNP) level   Elevated lactic acid level   Confusion  RLE Cellulitis/Toe Ulcer - Will continue IV Abx.  CCM suggested switching to vancomycin and meropenem to reduce likelihood of propagating renal injury.  Will monitor cr and adjust as needed.  Cr today a little better.  Dr Lajoyce Corners saw pt regarding the infection and to see @ LE and toe Amputation.  His recs were; We'll plan for an MRI scan(and compare to 04/2012) to evaluate for the possibility of chronic osteomyelitis in light of the chronic ulcer and the lytic bone changes. Will have the patient placed in a Profore wrap to decrease the swelling continue IV antibiotics.  LE Venous Duplex were (-) and no DVT. LE Art Dopplers ordered and do not appear to have been done  yet. Prior result from 02/2012 was fine c good blood flow. Dr Hart Rochester has done R Leg Saph Vein ablation.   Fever/Confusion/Leukocytosis/Tachypnea/Acidosis/Elevated D Dimer due to Sepsis.  Seen by CCM.  LE Dopplers (-)/V-Q scan P - Seems low likely for PE. As far as confusion - CCT = Old CVA nothing acute.  He remains near baseline this am.  White count has improved. No more fever.  Elevated troponin due to sepsis +/- Demand Ischemia.  Seen by Dr Donnie Aho and Dr Antoine Poche.  Current EF is worse than baseline 1. Peak Trop 2.57. 2. Remains on full dose anticoagulation  3. Continue other cardiac medications  4. Brief run of SVT noted by cards -- H/O prior SVT.  CAD/Ischemic CM/Myocardial infarction (1987)/CABG 4 v (1993) -- c Demand ischemia and Acute CHF.  Prior EF was 45%.  Current ECHo down to 25-30%.  DD.  Wall motion issues noted.  Mild LVH  AKI - Mild Cr bump up to 1.4 - now 1.3 and baseline is 1.2.  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  Morbid Obesity - Needs weight loss.  HTN  Hyperlipidemia.  OSA - CPAP. Dr Maple Hudson has been helping manage this as outpatient.  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 - Levimir and ISS here only -   Recent Labs  01/08/13 1217 01/08/13 1741 01/08/13 2204  GLUCAP 196* 167* 265*   ID -  Anti-infectives   Start     Dose/Rate Route Frequency Ordered Stop   01/08/13 1000  vancomycin (VANCOCIN) 1,500 mg in sodium chloride 0.9 % 500 mL IVPB     1,500 mg 250 mL/hr over 120 Minutes Intravenous Every 12 hours 01/08/13 0945     01/08/13 1000  piperacillin-tazobactam (ZOSYN) IVPB 3.375 g     3.375 g 12.5 mL/hr over 240 Minutes Intravenous 3 times per day 01/08/13 0945     01/07/13 2015  vancomycin (VANCOCIN) 2,000 mg in sodium chloride  0.9 % 500 mL IVPB     2,000 mg 250 mL/hr over 120 Minutes Intravenous STAT 01/07/13 2012 01/07/13 2344   01/07/13 2015  piperacillin-tazobactam (ZOSYN) IVPB 3.375 g      3.375 g 100 mL/hr over 30 Minutes Intravenous STAT 01/07/13 2012 01/07/13 2145     DVT Prophylaxis - on Full dose anticoagulation.    LOS: 2 days   Kerry Waters M 01/09/2013, 7:40 AM

## 2013-01-09 NOTE — Progress Notes (Signed)
Pt. Was placed on CPAP of 19cm H2O via nasal mask with 2L O2 bled in. Pt. Is tolerating CPAP well at this time without any complications.

## 2013-01-09 NOTE — Progress Notes (Signed)
VASCULAR LAB PRELIMINARY  ARTERIAL  ABI completed:    RIGHT    LEFT    PRESSURE WAVEFORM  PRESSURE WAVEFORM  BRACHIAL 141 triphasic BRACHIAL 136 triphasic  DP   DP    AT 97 monophasic AT 162 triphasic  PT 108 monophasic PT 172 triphasic  PER   PER    GREAT TOE  NA GREAT TOE  NA    RIGHT LEFT  ABI 0.77 >1.0     Saurabh Hettich, RVT 01/09/2013, 5:19 PM

## 2013-01-10 ENCOUNTER — Inpatient Hospital Stay (HOSPITAL_COMMUNITY): Payer: Medicare Other

## 2013-01-10 LAB — COMPREHENSIVE METABOLIC PANEL
ALT: 24 U/L (ref 0–53)
Alkaline Phosphatase: 78 U/L (ref 39–117)
BUN: 24 mg/dL — ABNORMAL HIGH (ref 6–23)
CO2: 19 mEq/L (ref 19–32)
Calcium: 8.1 mg/dL — ABNORMAL LOW (ref 8.4–10.5)
GFR calc Af Amer: 61 mL/min — ABNORMAL LOW (ref >90)
GFR calc non Af Amer: 52 mL/min — ABNORMAL LOW (ref >90)
Glucose, Bld: 168 mg/dL — ABNORMAL HIGH (ref 70–99)
Potassium: 4 mEq/L (ref 3.5–5.1)
Sodium: 137 mEq/L (ref 135–145)

## 2013-01-10 LAB — CBC WITH DIFFERENTIAL/PLATELET
Basophils Absolute: 0.1 10*3/uL (ref 0.0–0.1)
Basophils Relative: 1 % (ref 0–1)
Hemoglobin: 11.1 g/dL — ABNORMAL LOW (ref 13.0–17.0)
Lymphocytes Relative: 10 % — ABNORMAL LOW (ref 12–46)
MCHC: 35.4 g/dL (ref 30.0–36.0)
Monocytes Relative: 10 % (ref 3–12)
Neutro Abs: 8 10*3/uL — ABNORMAL HIGH (ref 1.7–7.7)
Neutrophils Relative %: 79 % — ABNORMAL HIGH (ref 43–77)
RDW: 15 % (ref 11.5–15.5)
WBC: 10.2 10*3/uL (ref 4.0–10.5)

## 2013-01-10 LAB — GLUCOSE, CAPILLARY
Glucose-Capillary: 178 mg/dL — ABNORMAL HIGH (ref 70–99)
Glucose-Capillary: 190 mg/dL — ABNORMAL HIGH (ref 70–99)

## 2013-01-10 MED ORDER — FUROSEMIDE 10 MG/ML IJ SOLN
INTRAMUSCULAR | Status: AC
  Filled 2013-01-10: qty 4

## 2013-01-10 MED ORDER — FUROSEMIDE 10 MG/ML IJ SOLN
20.0000 mg | Freq: Two times a day (BID) | INTRAMUSCULAR | Status: DC
  Administered 2013-01-10 – 2013-01-11 (×3): 20 mg via INTRAVENOUS
  Filled 2013-01-10 (×5): qty 2

## 2013-01-10 MED ORDER — PRO-STAT SUGAR FREE PO LIQD
30.0000 mL | Freq: Two times a day (BID) | ORAL | Status: DC
  Administered 2013-01-10 – 2013-01-13 (×7): 30 mL via ORAL
  Filled 2013-01-10 (×8): qty 30

## 2013-01-10 MED ORDER — ENOXAPARIN SODIUM 40 MG/0.4ML ~~LOC~~ SOLN
40.0000 mg | SUBCUTANEOUS | Status: DC
  Administered 2013-01-11 – 2013-01-13 (×3): 40 mg via SUBCUTANEOUS
  Filled 2013-01-10 (×3): qty 0.4

## 2013-01-10 MED ORDER — GADOBENATE DIMEGLUMINE 529 MG/ML IV SOLN
20.0000 mL | Freq: Once | INTRAVENOUS | Status: AC | PRN
  Administered 2013-01-10: 20 mL via INTRAVENOUS

## 2013-01-10 MED ORDER — FUROSEMIDE 10 MG/ML IJ SOLN: 40.0000 mg | Freq: Two times a day (BID) | INTRAMUSCULAR | Status: DC

## 2013-01-10 NOTE — Progress Notes (Signed)
Subjective: F/up of  fever, Confusion, RLE Cellulitis, Toe Ulcer, Leukocytosis, Elevated Trop I and Sepsis In Stepdown on IV Abx. Mentally better. AVSS. #s improving. No new c/o Pain controlled. OK to transfer to tele.  Objective: Vital signs in last 24 hours: Temp:  [97.9 F (36.6 C)-99 F (37.2 C)] 97.9 F (36.6 C) (09/24 0400) Pulse Rate:  [59-80] 72 (09/24 0400) Resp:  [20-25] 23 (09/24 0400) BP: (98-143)/(36-73) 105/45 mmHg (09/24 0400) SpO2:  [96 %-98 %] 98 % (09/24 0400) Weight:  [151.4 kg (333 lb 12.4 oz)] 151.4 kg (333 lb 12.4 oz) (09/24 0400) Weight change: 0.5 kg (1 lb 1.6 oz)    CBG (last 3)   Recent Labs  01/09/13 1214 01/09/13 1826 01/09/13 2151  GLUCAP 153* 182* 265*    Intake/Output from previous day:  Intake/Output Summary (Last 24 hours) at 01/10/13 0746 Last data filed at 01/10/13 0600  Gross per 24 hour  Intake   2800 ml  Output    578 ml  Net   2222 ml   09/23 0701 - 09/24 0700 In: 2800 [P.O.:960; I.V.:690; IV Piggyback:1150] Out: 578 [Urine:578]   Physical Exam General appearance: Obese. Alert.  Back to his normal. Throat: oropharynx moist without erythema Resp: More clear Cardio: Reg. Sternotomy. HR 60s occ PVCs GI: Obese. soft, non-tender; bowel sounds normal; no masses,  no organomegaly Extremities: 1-2+ Edema R>L.  CVI LLE.  RLE c sig Cellulitis to below knee.  R Great toe scab no pus and looks better than anticipated.  Lab Results:  Recent Labs  01/09/13 0455 01/10/13 0600  NA 132* 137  K 3.9 4.0  CL 97 103  CO2 22 19  GLUCOSE 148* 168*  BUN 24* 24*  CREATININE 1.31 1.36*  CALCIUM 8.7 8.1*     Recent Labs  01/09/13 0455 01/10/13 0600  AST 45* 41*  ALT 21 24  ALKPHOS 63 78  BILITOT 0.6 0.7  PROT 6.5 6.3  ALBUMIN 2.6* 2.5*     Recent Labs  01/09/13 0455 01/10/13 0600  WBC 9.9 10.2  NEUTROABS 8.2* 8.0*  HGB 11.6* 11.1*  HCT 33.0* 31.4*  MCV 93.5 94.0  PLT 118* 131*    No results found for this  basename: INR,  PROTIME     Recent Labs  01/07/13 1940 01/07/13 2126 01/08/13 0350  TROPONINI 2.32* 2.70* 2.57*    No results found for this basename: TSH, T4TOTAL, FREET3, T3FREE, THYROIDAB,  in the last 72 hours  No results found for this basename: VITAMINB12, FOLATE, FERRITIN, TIBC, IRON, RETICCTPCT,  in the last 72 hours  Micro Results: Recent Results (from the past 240 hour(s))  CULTURE, BLOOD (ROUTINE X 2)     Status: None   Collection Time    01/07/13  7:40 PM      Result Value Range Status   Specimen Description BLOOD RIGHT ARM   Final   Special Requests BOTTLES DRAWN AEROBIC AND ANAEROBIC 8CC EA   Final   Culture  Setup Time     Final   Value: 01/08/2013 04:11     Performed at [facility]   Culture     Final   Value:        BLOOD CULTURE RECEIVED NO GROWTH TO DATE CULTURE WILL BE HELD FOR 5 DAYS BEFORE ISSUING A FINAL NEGATIVE REPORT     Performed at [facility]   Report Status PENDING   Incomplete  CULTURE, BLOOD (ROUTINE X 2)  Status: None   Collection Time    01/07/13  7:45 PM      Result Value Range Status   Specimen Description BLOOD RIGHT ARM   Final   Special Requests BOTTLES DRAWN AEROBIC AND ANAEROBIC 5CC EA   Final   Culture  Setup Time     Final   Value: 01/08/2013 04:11     Performed at [facility]   Culture     Final   Value:        BLOOD CULTURE RECEIVED NO GROWTH TO DATE CULTURE WILL BE HELD FOR 5 DAYS BEFORE ISSUING A FINAL NEGATIVE REPORT     Performed at [facility]   Report Status PENDING   Incomplete  MRSA PCR SCREENING     Status: Abnormal   Collection Time    01/08/13  6:21 AM      Result Value Range Status   MRSA by PCR POSITIVE (*) NEGATIVE Final   Comment:            The GeneXpert MRSA Assay (FDA     approved for NASAL specimens     only), is one component of a     comprehensive MRSA colonization     surveillance program. It is not     intended to diagnose MRSA     infection nor to guide  or     monitor treatment for     MRSA infections.     RESULT CALLED TO, READ BACK BY AND VERIFIED WITH:     H MILLS,RN 9/22 0747 BY K SCHULTZ     Studies/Results: Dg Foot Complete Right  01/08/2013   CLINICAL DATA:  Ulcer right great toe, diabetes, question osteomyelitis  EXAM: RIGHT FOOT COMPLETE - 3+ VIEW  COMPARISON:  02/24/2012 great toe radiographs, right foot radiographs 06/02/2010  FINDINGS: Dressing artifacts at great toe.  Diffuse osseous demineralization.  Joint spaces preserved.  Soft tissue swelling greatest at dorsum of foot.  Cortices at the phalanges of the great toe appear intact.  No fracture, dislocation or bone destruction.  Specifically no definite evidence of bone destruction at the great toe to suggest osteomyelitis.  Small subchondral cyst is noted at the base of the middle phalanx of the 2nd toe.  Scattered atherosclerotic calcification at ankle.  IMPRESSION: No definite evidence of osteomyelitis at the great toe. Significant soft tissue swelling greatest at dorsum of right foot.  If there is persistent clinical concern for osteomyelitis, then recommend MR imaging of the right foot with and without contrast for further evaluation.   Electronically Signed   By: Ulyses Southward M.D.   On: 01/08/2013 13:10     Medications: Scheduled: . aspirin EC  325 mg Oral Daily  . atorvastatin  80 mg Oral Daily  . Chlorhexidine Gluconate Cloth  6 each Topical Q0600  . clopidogrel  75 mg Oral Q breakfast  . diltiazem  180 mg Oral BID  . enoxaparin (LOVENOX) injection  150 mg Subcutaneous Q12H  . insulin aspart  0-15 Units Subcutaneous TID WC  . insulin aspart  0-5 Units Subcutaneous QHS  . insulin aspart  3 Units Subcutaneous TID WC  . insulin detemir  45 Units Subcutaneous QHS  . labetalol  200 mg Oral BID  . lisinopril  10 mg Oral BID  . multivitamin with minerals  1 tablet Oral Daily  . mupirocin ointment  1 application Nasal BID  . piperacillin-tazobactam (ZOSYN)  IV  3.375 g  Intravenous Q8H  .  saccharomyces boulardii  250 mg Oral BID  . sodium chloride  3 mL Intravenous Q12H  . spironolactone  25 mg Oral Daily  . vancomycin  1,500 mg Intravenous Q12H   Continuous: . sodium chloride 30 mL/hr at 01/09/13 2300     Assessment/Plan: Active Problems:   Cellulitis   Sepsis   Elevated troponin   Hyponatremia   Hyperglycemia   Elevated brain natriuretic peptide (BNP) level   Elevated lactic acid level   Confusion  RLE Cellulitis/Toe Ulcer - IV Abx.   Dr Lajoyce Corners input appreciated.  Await MRI and Prfore wraps LE Venous Duplex were (-) and no DVT. RLE ABI = .6  Dr Hart Rochester has done R Leg Saph Vein ablation in past.   Fever/Confusion/Leukocytosis/Tachypnea/Acidosis/Elevated D Dimer due to Sepsis.   LE Dopplers (-)/V-Q scan cancelled - Seems low likely for PE. OK for DVT Proph dosing of the Lovenox.  As far as confusion - CCT = Old CVA nothing acute.   He remains near baseline this am.  White count has improved. No more fever.  Elevated troponin due to sepsis +/- Demand Ischemia.  Seen by Dr Donnie Aho and Dr Antoine Poche.  Current EF is worse than baseline 1. Peak Trop 2.57. 2. Remains on full dose anticoagulation but Ok to change to DVT Proph dosing. 3. Continue other cardiac medications  4. Brief run of SVT noted by cards -- H/O prior SVT.  CAD/Ischemic CM/Myocardial infarction (1987)/CABG 4 v (1993) -- c Demand ischemia and Acute CHF.  Prior EF was 45%.  Current ECHo down to 25-30%.  DD.  Wall motion issues noted.  Mild LVH.  Too Big for MUGA  AKI - Mild Cr bump up to 1.4 - now 1.3 and baseline is 1.2.  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  Morbid Obesity - Needs weight loss.  HTN - BP fine.  Hyperlipidemia.  OSA - CPAP. Dr Maple Hudson has been helping manage this as outpatient.  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 - Levimir and ISS here only -   Recent Labs  01/09/13 1214 01/09/13 1826 01/09/13 2151   GLUCAP 153* 182* 265*   ID -  Anti-infectives   Start     Dose/Rate Route Frequency Ordered Stop   01/08/13 1000  vancomycin (VANCOCIN) 1,500 mg in sodium chloride 0.9 % 500 mL IVPB     1,500 mg 250 mL/hr over 120 Minutes Intravenous Every 12 hours 01/08/13 0945     01/08/13 1000  piperacillin-tazobactam (ZOSYN) IVPB 3.375 g     3.375 g 12.5 mL/hr over 240 Minutes Intravenous 3 times per day 01/08/13 0945     01/07/13 2015  vancomycin (VANCOCIN) 2,000 mg in sodium chloride 0.9 % 500 mL IVPB     2,000 mg 250 mL/hr over 120 Minutes Intravenous STAT 01/07/13 2012 01/07/13 2344   01/07/13 2015  piperacillin-tazobactam (ZOSYN) IVPB 3.375 g     3.375 g 100 mL/hr over 30 Minutes Intravenous STAT 01/07/13 2012 01/07/13 2145     DVT Prophylaxis -     LOS: 3 days   Tauheed Mcfayden M 01/10/2013, 7:46 AM

## 2013-01-10 NOTE — Progress Notes (Addendum)
Attempted to call report to [facility] 3W RN. Unable to take report at this time, left call back number. Awaiting to give report to RN.  1058: Report given to [facility]. MRI contacted- pt will be transferred from MRI to 3W. All belongings to be sent to 3W04. Wound care notified of room change due to leg wraps. CMT and eICU notified of room change. Will await family return to move belongings. Meds sent to new room, will give 1000 meds upon return from MRI  1120: Son, Barbara Cower, aware of room change. Took patient glasses with him to [phone]. Staff will move remainder of belongings to 3W.  1150: meds and belongings sent to new location, 3W RN hand off of medications.

## 2013-01-10 NOTE — Progress Notes (Signed)
SUBJECTIVE:  The patient is much more awake and alert today. He denies any chest discomfort or shortness of breath.   PHYSICAL EXAM Filed Vitals:   01/09/13 1959 01/09/13 2250 01/09/13 2355 01/10/13 0400  BP: 143/73  103/36 105/45  Pulse: 80 80 62 72  Temp: 99 F (37.2 C)  98.5 F (36.9 C) 97.9 F (36.6 C)  TempSrc: Oral  Axillary Axillary  Resp: 23 20 25 23   Height:      Weight:    333 lb 12.4 oz (151.4 kg)  SpO2: 96% 96% 98% 98%   General:  No distress Lungs: No crackles.  Heart:  RRR Abdomen:  Positive bowel sounds, no rebound no guarding Extremities:  Right greater than left leg edema unchanged    LABS: Lab Results  Component Value Date   TROPONINI 2.57* 01/08/2013   Results for orders placed during the hospital encounter of 01/07/13 (from the past 24 hour(s))  GLUCOSE, CAPILLARY     Status: Abnormal   Collection Time    01/09/13 12:14 PM      Result Value Range   Glucose-Capillary 153 (*) 70 - 99 mg/dL   Comment 1 Documented in Chart     Comment 2 Notify RN    GLUCOSE, CAPILLARY     Status: Abnormal   Collection Time    01/09/13  6:26 PM      Result Value Range   Glucose-Capillary 182 (*) 70 - 99 mg/dL  GLUCOSE, CAPILLARY     Status: Abnormal   Collection Time    01/09/13  9:51 PM      Result Value Range   Glucose-Capillary 265 (*) 70 - 99 mg/dL   Comment 1 Notify RN     Comment 2 Documented in Chart    COMPREHENSIVE METABOLIC PANEL     Status: Abnormal   Collection Time    01/10/13  6:00 AM      Result Value Range   Sodium 137  135 - 145 mEq/L   Potassium 4.0  3.5 - 5.1 mEq/L   Chloride 103  96 - 112 mEq/L   CO2 19  19 - 32 mEq/L   Glucose, Bld 168 (*) 70 - 99 mg/dL   BUN 24 (*) 6 - 23 mg/dL   Creatinine, Ser 4.09 (*) 0.50 - 1.35 mg/dL   Calcium 8.1 (*) 8.4 - 10.5 mg/dL   Total Protein 6.3  6.0 - 8.3 g/dL   Albumin 2.5 (*) 3.5 - 5.2 g/dL   AST 41 (*) 0 - 37 U/L   ALT 24  0 - 53 U/L   Alkaline Phosphatase 78  39 - 117 U/L   Total Bilirubin 0.7   0.3 - 1.2 mg/dL   GFR calc non Af Amer 52 (*) >90 mL/min   GFR calc Af Amer 61 (*) >90 mL/min  CBC WITH DIFFERENTIAL     Status: Abnormal   Collection Time    01/10/13  6:00 AM      Result Value Range   WBC 10.2  4.0 - 10.5 K/uL   RBC 3.34 (*) 4.22 - 5.81 MIL/uL   Hemoglobin 11.1 (*) 13.0 - 17.0 g/dL   HCT 81.1 (*) 91.4 - 78.2 %   MCV 94.0  78.0 - 100.0 fL   MCH 33.2  26.0 - 34.0 pg   MCHC 35.4  30.0 - 36.0 g/dL   RDW 95.6  21.3 - 08.6 %   Platelets 131 (*) 150 - 400 K/uL  Neutrophils Relative % 79 (*) 43 - 77 %   Neutro Abs 8.0 (*) 1.7 - 7.7 K/uL   Lymphocytes Relative 10 (*) 12 - 46 %   Lymphs Abs 1.0  0.7 - 4.0 K/uL   Monocytes Relative 10  3 - 12 %   Monocytes Absolute 1.0  0.1 - 1.0 K/uL   Eosinophils Relative 1  0 - 5 %   Eosinophils Absolute 0.1  0.0 - 0.7 K/uL   Basophils Relative 1  0 - 1 %   Basophils Absolute 0.1  0.0 - 0.1 K/uL    Intake/Output Summary (Last 24 hours) at 01/10/13 0804 Last data filed at 01/10/13 0600  Gross per 24 hour  Intake   2530 ml  Output    577 ml  Net   1953 ml    EKG:  Sinus rhythm with atrial ectopy.  No acute ST T wave changes.  01/10/2013  ASSESSMENT AND PLAN:  ELEVATED TROPONIN:  History of CAD.  EF 25% on echo.   He weighted too much for the MUGA.  I am not sure if this EF is really 25% as the images on echo were very poor.  I do not know whether this represents a lower EF than baseline.  However, he has no new unstable symptoms.  Cath is certainly not indicated.  I am not planning any in patient stress testing but I will consider this as an outpatient.   HTN:  His blood pressure is low and will not allow med titration.    I agree with some diuresis for the leg edema    Rollene Rotunda 01/10/2013 8:04 AM

## 2013-01-10 NOTE — Progress Notes (Signed)
Patient ID: Kerry Waters, male   DOB: Mar 03, 1946, 67 y.o.   MRN: [id number] After 48 hours patient has not had his leg wrapped with a Profore wrap or the MRI scan obtained.  I will followup tomorrow.

## 2013-01-10 NOTE — Progress Notes (Signed)
ANTIBIOTIC CONSULT NOTE - FOLLOW UP:    Recent Labs  01/10/13 1250  VANCOTROUGH 29.3*    Vancomycin trough drawn while vancomycin was infusing  Plan -Reorder vancomycin trough for 9/25 in the AM

## 2013-01-10 NOTE — Progress Notes (Addendum)
INITIAL NUTRITION ASSESSMENT  DOCUMENTATION CODES Per approved criteria  -Morbid Obesity   INTERVENTION:  Prostat liquid protein 30 ml twice daily with meals (100 kcals, 15 gm protein per dose)  Continue multivitamin with minerals daily RD to follow for nutrition care plan  NUTRITION DIAGNOSIS: Increased nutrient needs related to wound healing as evidenced by estimated nutrition needs  Goal: Pt to meet >/= 90% of their estimated nutrition needs   Monitor:  PO & supplemental intake, weight, labs, I/O's  Reason for Assessment: Consult  67 y.o. male  Admitting Dx: RLE cellulitis  ASSESSMENT: Patient with PMH of CABG, HTN, CAD, sleep apnea and joint pain presented with chief complaint of "feeling bad;"in ER he was found to have significant RLE acute cellulitis, elevated WBC, ECG changes, abnormal Troponin I and BNP.   RD consulted for wound healing and weight loss.  Patient reports his appetite is pretty good; PO intake at 50-75% per flowsheet records; denies recent unintentional weight loss; amenable to RD ordering Prostat liquid protein to promote wound healing; provided "Weight Loss Tips" handout from [employer] to patient's son (patient was being prepared for MRI).  Height: Ht Readings from Last 1 Encounters:  01/07/13 5\' 11"  (1.803 m)    Weight: Wt Readings from Last 1 Encounters:  01/10/13 333 lb 12.4 oz (151.4 kg)    Ideal Body Weight: 172 lb  % Ideal Body Weight: 194%  Wt Readings from Last 10 Encounters:  01/10/13 333 lb 12.4 oz (151.4 kg)  12/25/12 342 lb 3.2 oz (155.221 kg)  10/24/12 337 lb (152.862 kg)  10/16/12 330 lb (149.687 kg)  10/09/12 334 lb 12.8 oz (151.864 kg)  09/05/12 333 lb (151.048 kg)  08/28/12 320 lb (145.151 kg)  08/15/12 320 lb (145.151 kg)  05/26/12 325 lb 9.6 oz (147.691 kg)  05/12/12 328 lb (148.78 kg)    Usual Body Weight: 337 lb  % Usual Body Weight: 98%  BMI:  Body mass index is 46.57  kg/(m^2).  Estimated Nutritional Needs: Kcal: 2200-2400 Protein: 120-130 gm Fluid: 2.2-2.4 L  Skin: venous stasis leg wounds  Diet Order: Carb Control  EDUCATION NEEDS: -Education needs addressed    Intake/Output Summary (Last 24 hours) at 01/10/13 1003 Last data filed at 01/10/13 0600  Gross per 24 hour  Intake   2470 ml  Output    577 ml  Net   1893 ml    Labs:   Recent Labs Lab 01/08/13 0350 01/09/13 0455 01/10/13 0600  NA 130* 132* 137  K 3.9 3.9 4.0  CL 94* 97 103  CO2 23 22 19   BUN 26* 24* 24*  CREATININE 1.40* 1.31 1.36*  CALCIUM 8.8 8.7 8.1*  GLUCOSE 218* 148* 168*    CBG (last 3)   Recent Labs  01/09/13 1826 01/09/13 2151 01/10/13 0830  GLUCAP 182* 265* 178*    Scheduled Meds: . aspirin EC  325 mg Oral Daily  . atorvastatin  80 mg Oral Daily  . Chlorhexidine Gluconate Cloth  6 each Topical Q0600  . clopidogrel  75 mg Oral Q breakfast  . diltiazem  180 mg Oral BID  . enoxaparin (LOVENOX) injection  150 mg Subcutaneous Q12H  . furosemide  20 mg Intravenous BID  . insulin aspart  0-15 Units Subcutaneous TID WC  . insulin aspart  0-5 Units Subcutaneous QHS  . insulin aspart  3 Units Subcutaneous TID WC  . insulin detemir  45 Units Subcutaneous QHS  . labetalol  200  mg Oral BID  . lisinopril  10 mg Oral BID  . multivitamin with minerals  1 tablet Oral Daily  . mupirocin ointment  1 application Nasal BID  . piperacillin-tazobactam (ZOSYN)  IV  3.375 g Intravenous Q8H  . saccharomyces boulardii  250 mg Oral BID  . sodium chloride  3 mL Intravenous Q12H  . spironolactone  25 mg Oral Daily  . vancomycin  1,500 mg Intravenous Q12H    Continuous Infusions: . sodium chloride 30 mL/hr at 01/09/13 2300    Past Medical History  Diagnosis Date  . Joint pain   . Ulcer of foot   . Ischemic heart disease   . MI (myocardial infarction) 1987    ANTERIOR SEPTAL  . Hypokinesis     MILD INFERIOR  . Hypertension   . Edema of lower extremity   .  Obesities, morbid   . SVT (supraventricular tachycardia)   . Hx of CABG 1993  . Abnormal cardiovascular stress test 2008    EF 48%. No clear cut ischemia. Low risk scan  . Diabetes mellitus     Dr. Timothy Lasso follows.  [name] PVD (peripheral vascular disease)   . Venous stasis   . Peripheral neuropathy   . Hyperlipidemia   . Coronary artery disease   . Sleep apnea     CPAP  . History of angina   . Cancer     basil cell carcinoma  . Stroke     pt states possible stroke on 04/25/2012    Past Surgical History  Procedure Laterality Date  . Coronary artery bypass graft  1993    LIMA to LAD with patch angioplasty, SVG to OM, SVG to OM & PD and patch angioplasy to PDA  . Left ankle orif  2000    Maureen Chatters, RD, LDN Pager #: [phone] After-Hours Pager #: [phone]

## 2013-01-10 NOTE — Evaluation (Signed)
Physical Therapy One Time Evaluation Patient Details Name: Kerry Waters MRN: [id number] DOB: 1945-08-05 Today's Date: 01/10/2013 Time: 4098-1191 PT Time Calculation (min): 15 min  PT Assessment / Plan / Recommendation History of Present Illness  67 yo man with PMHx of PVD, MI, stroke, venous stasis, and morbid obesity. He has felt bad for the last 5 days.  He may have had a fever in the last day or so. He may have also had some confusion. He denies cp.  He has had a normal appetite.   In the ER he is found to have significant RLE acute cellulitis. He also has an elevated wbc as well as possible ecg changes and abnL trop I and bnp.   He will require admission. 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 He has not felt driving.  No pain right now.  He fell last nigh possibly although the story is not clear on this as he denies it today.  Clinical Impression  Pt admitted with R LE cellulitis. Pt currently with antalgic gait pattern and min/guard for safety due to pt declining assistive device.  Pt reports he will "be fine" and therapist recommended at least using SPC upon d/c for safety and assisting with R LE pain to pt and family in room.   Pt politely declined any further PT services and also declined assistive device.  Education completed and pt declining PT services so will sign off.     PT Assessment  Patent does not need any further PT services    Follow Up Recommendations  No PT follow up (pt declines further PT)    Does the patient have the potential to tolerate intense rehabilitation      Barriers to Discharge        Equipment Recommendations  Rolling walker with 5" wheels (may need wide or bari)    Recommendations for Other Services     Frequency      Precautions / Restrictions Precautions Precautions: Fall   Pertinent Vitals/Pain Pt reports mild pain in R LE with gait.   Repositioned in supine.      Mobility  Bed Mobility Bed Mobility: Supine to  Sit;Sit to Supine Supine to Sit: 6: Modified independent (Device/Increase time);With rails Sit to Supine: 6: Modified independent (Device/Increase time);With rail Details for Bed Mobility Assistance: increased effort and use of hand rail Transfers Transfers: Sit to Stand;Stand to Sit Sit to Stand: 5: Supervision;From bed;With upper extremity assist Stand to Sit: 5: Supervision;To bed;With upper extremity assist Ambulation/Gait Ambulation/Gait Assistance: 4: Min guard Ambulation Distance (Feet): 60 Feet Assistive device: Other (Comment) Ambulation/Gait Assistance Details: pt initially pushing IV pole however LOB due to foot catching IV pole legs and pt able to self correct, PT then pushed IV pole and pt used hand rail for support, declined any assistive device, labored breathing end of ambulation however pt denied SOB Gait Pattern: Decreased stance time - right;Decreased step length - left;Antalgic Gait velocity: encouraged pt to slow pace for safety with lines General Gait Details: recommended to pt and family that pt use assistive device at least [facility] upon return home to help steady pt and assist with antalgic gait patter, however pt states "I'll be fine"    Exercises     PT Diagnosis:    PT Problem List:   PT Treatment Interventions:       PT Goals(Current goals can be found in the care plan section) Acute Rehab PT Goals  PT Goal Formulation: No goals set, d/c therapy  Visit Information  Last PT Received On: 01/10/13 Assistance Needed: +1 History of Present Illness: 67 yo man with PMHx of PVD, MI, stroke, venous stasis, and morbid obesity. He has felt bad for the last 5 days.  He may have had a fever in the last day or so. He may have also had some confusion. He denies cp.  He has had a normal appetite.   In the ER he is found to have significant RLE acute cellulitis. He also has an elevated wbc as well as possible ecg changes and abnL trop I and bnp.   He will require admission. 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 He has not felt driving.  No pain right now.  He fell last nigh possibly although the story is not clear on this as he denies it today.       Prior Functioning  Home Living Family/patient expects to be discharged to:: Private residence Living Arrangements: Spouse/significant other Available Help at Discharge: Family;Available 24 hours/day Type of Home: House Home Access: Level entry Home Layout: Able to live on main level with bedroom/bathroom Home Equipment: Gilmer Mor - single point Prior Function Level of Independence: Independent Comments: works for [employer] Communication: No difficulties    Cognition  Cognition Arousal/Alertness: Awake/alert Behavior During Therapy: WFL for tasks assessed/performed Overall Cognitive Status: Within Functional Limits for tasks assessed    Extremity/Trunk Assessment Upper Extremity Assessment Upper Extremity Assessment: RUE deficits/detail;Overall WFL for tasks assessed RUE Sensation: history of peripheral neuropathy Lower Extremity Assessment Lower Extremity Assessment: RLE deficits/detail RLE Deficits / Details: wound care dressing on R lower leg, pt reports stiff ankle at first however better with ambulation distance RLE Sensation: history of peripheral neuropathy   Balance    End of Session PT - End of Session Activity Tolerance: Patient tolerated treatment well Patient left: in bed;with call bell/phone within reach;with family/visitor present  GP     Eladio Dentremont,KATHrine E 01/10/2013, 3:50 PM Zenovia Jarred, PT, DPT 01/10/2013 Pager: [phone]

## 2013-01-10 NOTE — Consult Note (Signed)
WOC consult Note Reason for Consult: Profore wrap right leg Wound type: Neuropathic ulcer to plantar surface of first metatarsal.  Measurement: 2.2 cm x 1.1 cm x 0.2 cm. Wound bed: Ruddy red with calloused edges.  Drainage (amount, consistency, odor) Minimal Periwound: Calloused Dressing procedure/placement/frequency:  Profore wrap to right leg as ordered.     Will not follow at this time.  Please re-consult if needed.  Maple Hudson RN BSN CWON Pager [phone]

## 2013-01-11 LAB — COMPREHENSIVE METABOLIC PANEL
ALT: 26 U/L (ref 0–53)
BUN: 21 mg/dL (ref 6–23)
Calcium: 8.8 mg/dL (ref 8.4–10.5)
Chloride: 98 mEq/L (ref 96–112)
Creatinine, Ser: 1.35 mg/dL (ref 0.50–1.35)
GFR calc Af Amer: 61 mL/min — ABNORMAL LOW (ref >90)
Glucose, Bld: 202 mg/dL — ABNORMAL HIGH (ref 70–99)
Sodium: 132 mEq/L — ABNORMAL LOW (ref 135–145)
Total Bilirubin: 0.6 mg/dL (ref 0.3–1.2)
Total Protein: 7 g/dL (ref 6.0–8.3)

## 2013-01-11 LAB — CBC WITH DIFFERENTIAL/PLATELET
Basophils Relative: 0 % (ref 0–1)
HCT: 32.1 % — ABNORMAL LOW (ref 39.0–52.0)
Hemoglobin: 11.2 g/dL — ABNORMAL LOW (ref 13.0–17.0)
Lymphocytes Relative: 11 % — ABNORMAL LOW (ref 12–46)
Lymphs Abs: 1.2 10*3/uL (ref 0.7–4.0)
MCHC: 34.9 g/dL (ref 30.0–36.0)
Monocytes Absolute: 1 10*3/uL (ref 0.1–1.0)
Monocytes Relative: 9 % (ref 3–12)
Neutro Abs: 9.3 10*3/uL — ABNORMAL HIGH (ref 1.7–7.7)
RBC: 3.42 MIL/uL — ABNORMAL LOW (ref 4.22–5.81)
WBC: 11.6 10*3/uL — ABNORMAL HIGH (ref 4.0–10.5)

## 2013-01-11 LAB — GLUCOSE, CAPILLARY: Glucose-Capillary: 202 mg/dL — ABNORMAL HIGH (ref 70–99)

## 2013-01-11 LAB — VANCOMYCIN, TROUGH: Vancomycin Tr: 19.8 ug/mL (ref 10.0–20.0)

## 2013-01-11 MED ORDER — SILVER SULFADIAZINE 1 % EX CREA
TOPICAL_CREAM | Freq: Every day | CUTANEOUS | Status: DC
  Administered 2013-01-11 – 2013-01-13 (×3): via TOPICAL
  Filled 2013-01-11 (×2): qty 85

## 2013-01-11 NOTE — Progress Notes (Signed)
Patient ID: Kerry Waters, male   DOB: [birth year], 67 y.o.   MRN: [id number] Profore wrap applied. Patient has improving ulceration on the right great toe. Review of the MRI scan shows cellulitis/dermatitis but no osteomyelitis of the great toe. The great toe ulcer will need a daily dressing change. Patient has a very small superficial venous stasis ulcer on the left lower extremity. I will treat this as an outpatient.

## 2013-01-11 NOTE — Progress Notes (Signed)
ANTIBIOTIC CONSULT NOTE - FOLLOW UP  Pharmacy Consult for Vancomycin/Zosyn Indication: R-toe infection  No Known Allergies  Patient Measurements: Height: 5\' 11"  (180.3 cm) Weight: 333 lb 12.4 oz (151.4 kg) IBW/kg (Calculated) : 75.3  Vital Signs: Temp: 98.9 F (37.2 C) (09/25 0548) BP: 123/69 mmHg (09/25 0548) Pulse Rate: 75 (09/25 0548) Intake/Output from previous day: 09/24 0701 - 09/25 0700 In: 1460 [P.O.:360; IV Piggyback:1100] Out: 503 [Urine:500; Stool:3] Intake/Output from this shift: Total I/O In: 280 [P.O.:280] Out: 400 [Urine:400]  Labs:  Recent Labs  01/09/13 0455 01/10/13 0600 01/11/13 0730  WBC 9.9 10.2 11.6*  HGB 11.6* 11.1* 11.2*  PLT 118* 131* 145*  CREATININE 1.31 1.36* 1.35   Estimated Creatinine Clearance: 79.4 ml/min (by C-G formula based on Cr of 1.35).  Recent Labs  01/10/13 1250 01/11/13 1105  VANCOTROUGH 29.3* 19.8     Microbiology: Recent Results (from the past 720 hour(s))  CULTURE, BLOOD (ROUTINE X 2)     Status: None   Collection Time    01/07/13  7:40 PM      Result Value Range Status   Specimen Description BLOOD RIGHT ARM   Final   Special Requests BOTTLES DRAWN AEROBIC AND ANAEROBIC 8CC EA   Final   Culture  Setup Time     Final   Value: 01/08/2013 04:11     Performed at [facility]   Culture     Final   Value:        BLOOD CULTURE RECEIVED NO GROWTH TO DATE CULTURE WILL BE HELD FOR 5 DAYS BEFORE ISSUING A FINAL NEGATIVE REPORT     Performed at [facility]   Report Status PENDING   Incomplete  CULTURE, BLOOD (ROUTINE X 2)     Status: None   Collection Time    01/07/13  7:45 PM      Result Value Range Status   Specimen Description BLOOD RIGHT ARM   Final   Special Requests BOTTLES DRAWN AEROBIC AND ANAEROBIC 5CC EA   Final   Culture  Setup Time     Final   Value: 01/08/2013 04:11     Performed at [facility]   Culture     Final   Value:        BLOOD CULTURE RECEIVED NO GROWTH TO DATE  CULTURE WILL BE HELD FOR 5 DAYS BEFORE ISSUING A FINAL NEGATIVE REPORT     Performed at [facility]   Report Status PENDING   Incomplete  MRSA PCR SCREENING     Status: Abnormal   Collection Time    01/08/13  6:21 AM      Result Value Range Status   MRSA by PCR POSITIVE (*) NEGATIVE Final   Comment:            The GeneXpert MRSA Assay (FDA     approved for NASAL specimens     only), is one component of a     comprehensive MRSA colonization     surveillance program. It is not     intended to diagnose MRSA     infection nor to guide or     monitor treatment for     MRSA infections.     RESULT CALLED TO, READ BACK BY AND VERIFIED WITH:     H MILLS,RN 9/22 0747 BY K SCHULTZ    Anti-infectives   Start     Dose/Rate Route Frequency Ordered Stop   01/08/13 1000  vancomycin (VANCOCIN)  1,500 mg in sodium chloride 0.9 % 500 mL IVPB     1,500 mg 250 mL/hr over 120 Minutes Intravenous Every 12 hours 01/08/13 0945     01/08/13 1000  piperacillin-tazobactam (ZOSYN) IVPB 3.375 g     3.375 g 12.5 mL/hr over 240 Minutes Intravenous 3 times per day 01/08/13 0945     01/07/13 2015  vancomycin (VANCOCIN) 2,000 mg in sodium chloride 0.9 % 500 mL IVPB     2,000 mg 250 mL/hr over 120 Minutes Intravenous STAT 01/07/13 2012 01/07/13 2344   01/07/13 2015  piperacillin-tazobactam (ZOSYN) IVPB 3.375 g     3.375 g 100 mL/hr over 30 Minutes Intravenous STAT 01/07/13 2012 01/07/13 2145      Assessment: 67 y/o M who continues on vancomycin/zosyn for R-toe infection. WBC 11.6<10.2, afebrile, renal function stable, vancomycin trough today is 19.8. MRI revealed NO osteomyelitis. Per MD note, 2-3 more days of IV antibiotics likely, then transition to PO.   Goal of Therapy:  Vancomycin trough level 15-20 mcg/ml  Plan:  -Continue vancomycin 1500 mg IV q12h -Zosyn 3.375G IV q8h to be infused over 4 hours -Trend WBC, temp, renal function -F/U change to PO  Thank you for allowing me to take part  in this patient's care,  Kerry Waters, PharmD Clinical Pharmacist Phone: [phone] Pager: [phone] 01/11/2013 1:09 PM

## 2013-01-11 NOTE — Progress Notes (Signed)
SUBJECTIVE:  The patient denies any chest discomfort or shortness of breath.  He ambulated the length of the hallway.    PHYSICAL EXAM Filed Vitals:   01/10/13 2013 01/10/13 2300 01/11/13 0016 01/11/13 0548  BP: 123/68 126/59 120/69 123/69  Pulse: 82 72 116 75  Temp: 99.2 F (37.3 C) 97.8 F (36.6 C) 99.8 F (37.7 C) 98.9 F (37.2 C)  TempSrc:      Resp: 22 20 18 18   Height:      Weight:      SpO2: 91% 97% 100% 91%   General:  No distress Lungs: No crackles.  Heart:  RRR Abdomen:  Positive bowel sounds, no rebound no guarding Extremities:  Right greater than left leg edema unchanged    LABS: Lab Results  Component Value Date   TROPONINI 2.57* 01/08/2013   Results for orders placed during the hospital encounter of 01/07/13 (from the past 24 hour(s))  VANCOMYCIN, TROUGH     Status: Abnormal   Collection Time    01/10/13 12:50 PM      Result Value Range   Vancomycin Tr 29.3 (*) 10.0 - 20.0 ug/mL  GLUCOSE, CAPILLARY     Status: Abnormal   Collection Time    01/10/13  2:25 PM      Result Value Range   Glucose-Capillary 190 (*) 70 - 99 mg/dL   Comment 1 Documented in Chart     Comment 2 Notify RN    GLUCOSE, CAPILLARY     Status: Abnormal   Collection Time    01/10/13  4:56 PM      Result Value Range   Glucose-Capillary 134 (*) 70 - 99 mg/dL  GLUCOSE, CAPILLARY     Status: Abnormal   Collection Time    01/10/13  8:12 PM      Result Value Range   Glucose-Capillary 213 (*) 70 - 99 mg/dL  COMPREHENSIVE METABOLIC PANEL     Status: Abnormal   Collection Time    01/11/13  7:30 AM      Result Value Range   Sodium 132 (*) 135 - 145 mEq/L   Potassium 3.8  3.5 - 5.1 mEq/L   Chloride 98  96 - 112 mEq/L   CO2 20  19 - 32 mEq/L   Glucose, Bld 202 (*) 70 - 99 mg/dL   BUN 21  6 - 23 mg/dL   Creatinine, Ser 4.54  0.50 - 1.35 mg/dL   Calcium 8.8  8.4 - 09.8 mg/dL   Total Protein 7.0  6.0 - 8.3 g/dL   Albumin 2.6 (*) 3.5 - 5.2 g/dL   AST 37  0 - 37 U/L   ALT 26  0 - 53  U/L   Alkaline Phosphatase 113  39 - 117 U/L   Total Bilirubin 0.6  0.3 - 1.2 mg/dL   GFR calc non Af Amer 53 (*) >90 mL/min   GFR calc Af Amer 61 (*) >90 mL/min  CBC WITH DIFFERENTIAL     Status: Abnormal   Collection Time    01/11/13  7:30 AM      Result Value Range   WBC 11.6 (*) 4.0 - 10.5 K/uL   RBC 3.42 (*) 4.22 - 5.81 MIL/uL   Hemoglobin 11.2 (*) 13.0 - 17.0 g/dL   HCT 11.9 (*) 14.7 - 82.9 %   MCV 93.9  78.0 - 100.0 fL   MCH 32.7  26.0 - 34.0 pg   MCHC 34.9  30.0 - 36.0 g/dL  RDW 14.9  11.5 - 15.5 %   Platelets 145 (*) 150 - 400 K/uL   Neutrophils Relative % 80 (*) 43 - 77 %   Neutro Abs 9.3 (*) 1.7 - 7.7 K/uL   Lymphocytes Relative 11 (*) 12 - 46 %   Lymphs Abs 1.2  0.7 - 4.0 K/uL   Monocytes Relative 9  3 - 12 %   Monocytes Absolute 1.0  0.1 - 1.0 K/uL   Eosinophils Relative 1  0 - 5 %   Eosinophils Absolute 0.1  0.0 - 0.7 K/uL   Basophils Relative 0  0 - 1 %   Basophils Absolute 0.1  0.0 - 0.1 K/uL  GLUCOSE, CAPILLARY     Status: Abnormal   Collection Time    01/11/13  8:03 AM      Result Value Range   Glucose-Capillary 202 (*) 70 - 99 mg/dL   Comment 1 Notify RN      Intake/Output Summary (Last 24 hours) at 01/11/13 0900 Last data filed at 01/11/13 9604  Gross per 24 hour  Intake   1740 ml  Output    503 ml  Net   1237 ml    ASSESSMENT AND PLAN:  ELEVATED TROPONIN:  History of CAD. Cath is certainly not indicated. He has had no recent unstable symptoms.    I am not planning any inpatient stress testing but I will consider this as an outpatient.    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   He weighted too much for the MUGA.  I am not sure if this EF is really 25% as the images on echo were very poor.  I do not know whether this represents a lower EF than baseline.  IV diuretic given yesterday.  IO probably incomplete.  Weight unchanged.  I agree with continuing current IV Lasix.  The creatinine seems to be tolerating this.      Fayrene Fearing  Lendell Gallick 01/11/2013 9:00 AM

## 2013-01-11 NOTE — Progress Notes (Signed)
Subjective: Now on floor. Improving. No Osteo on MRI. Appreciate Dr Audrie Lia input.  Objective: Vital signs in last 24 hours: Temp:  [97.8 F (36.6 C)-99.8 F (37.7 C)] 98.9 F (37.2 C) (09/25 0548) Pulse Rate:  [72-116] 75 (09/25 0548) Resp:  [18-23] 18 (09/25 0548) BP: (117-134)/(59-69) 123/69 mmHg (09/25 0548) SpO2:  [91 %-100 %] 91 % (09/25 0548) Weight change:  Last BM Date: 01/06/13  CBG (last 3)   Recent Labs  01/10/13 1425 01/10/13 1656 01/10/13 2012  GLUCAP 190* 134* 213*    Intake/Output from previous day:  Intake/Output Summary (Last 24 hours) at 01/11/13 0736 Last data filed at 01/11/13 0541  Gross per 24 hour  Intake   1460 ml  Output    503 ml  Net    957 ml   09/24 0701 - 09/25 0700 In: 1460 [P.O.:360; IV Piggyback:1100] Out: 503 [Urine:500; Stool:3]   Physical Exam General appearance: Obese. Alert.  Back to his normal. Throat: oropharynx moist without erythema Resp: More clear Cardio: Reg. Sternotomy.  GI: Obese. soft, non-tender; bowel sounds normal; no masses,  no organomegaly Extremities: 1-2+ Edema R>L.  CVI LLE.  RLE c sig Cellulitis to below knee starting to improve.  Wrap in place.  R Great toe scab no pus and looks better than anticipated.  Lab Results:  Recent Labs  01/09/13 0455 01/10/13 0600  NA 132* 137  K 3.9 4.0  CL 97 103  CO2 22 19  GLUCOSE 148* 168*  BUN 24* 24*  CREATININE 1.31 1.36*  CALCIUM 8.7 8.1*     Recent Labs  01/09/13 0455 01/10/13 0600  AST 45* 41*  ALT 21 24  ALKPHOS 63 78  BILITOT 0.6 0.7  PROT 6.5 6.3  ALBUMIN 2.6* 2.5*     Recent Labs  01/09/13 0455 01/10/13 0600  WBC 9.9 10.2  NEUTROABS 8.2* 8.0*  HGB 11.6* 11.1*  HCT 33.0* 31.4*  MCV 93.5 94.0  PLT 118* 131*    No results found for this basename: INR,  PROTIME    No results found for this basename: CKTOTAL, CKMB, CKMBINDEX, TROPONINI,  in the last 72 hours  No results found for this basename: TSH, T4TOTAL, FREET3, T3FREE,  THYROIDAB,  in the last 72 hours  No results found for this basename: VITAMINB12, FOLATE, FERRITIN, TIBC, IRON, RETICCTPCT,  in the last 72 hours  Micro Results: Recent Results (from the past 240 hour(s))  CULTURE, BLOOD (ROUTINE X 2)     Status: None   Collection Time    01/07/13  7:40 PM      Result Value Range Status   Specimen Description BLOOD RIGHT ARM   Final   Special Requests BOTTLES DRAWN AEROBIC AND ANAEROBIC 8CC EA   Final   Culture  Setup Time     Final   Value: 01/08/2013 04:11     Performed at [facility]   Culture     Final   Value:        BLOOD CULTURE RECEIVED NO GROWTH TO DATE CULTURE WILL BE HELD FOR 5 DAYS BEFORE ISSUING A FINAL NEGATIVE REPORT     Performed at [facility]   Report Status PENDING   Incomplete  CULTURE, BLOOD (ROUTINE X 2)     Status: None   Collection Time    01/07/13  7:45 PM      Result Value Range Status   Specimen Description BLOOD RIGHT ARM   Final   Special Requests BOTTLES  DRAWN AEROBIC AND ANAEROBIC 5CC EA   Final   Culture  Setup Time     Final   Value: 01/08/2013 04:11     Performed at [facility]   Culture     Final   Value:        BLOOD CULTURE RECEIVED NO GROWTH TO DATE CULTURE WILL BE HELD FOR 5 DAYS BEFORE ISSUING A FINAL NEGATIVE REPORT     Performed at [facility]   Report Status PENDING   Incomplete  MRSA PCR SCREENING     Status: Abnormal   Collection Time    01/08/13  6:21 AM      Result Value Range Status   MRSA by PCR POSITIVE (*) NEGATIVE Final   Comment:            The GeneXpert MRSA Assay (FDA     approved for NASAL specimens     only), is one component of a     comprehensive MRSA colonization     surveillance program. It is not     intended to diagnose MRSA     infection nor to guide or     monitor treatment for     MRSA infections.     RESULT CALLED TO, READ BACK BY AND VERIFIED WITH:     H MILLS,RN 9/22 0747 BY K SCHULTZ     Studies/Results: Mr Toes Right Wo/w  Cm  01/10/2013   CLINICAL DATA:  Great toe ulceration.  EXAM: MRI OF THE RIGHT TOES WITHOUT AND WITH CONTRAST  TECHNIQUE: Multiplanar, multisequence MR imaging was performed both before and after administration of intravenous contrast.  CONTRAST:  20mL MULTIHANCE GADOBENATE DIMEGLUMINE 529 MG/ML IV SOLN  COMPARISON:  Radiographs 01/08/2013.  FINDINGS: There is a massive dorsal soft tissue swelling/ edema and fluid involving the entire forefoot. This likely reflects cellulitis. Mild diffuse fatty atrophy involving the forefoot musculature with mild myositis. No focal drainable soft tissue abscess or findings for pyomyositis.  The bony structures are intact. No MR findings to suggest osteomyelitis or septic arthritis. Mild degenerative changes noted at the 1st metatarsophalangeal joint. The flexor and extensor tendons are intact.  IMPRESSION: 1. Marked dorsal subcutaneous soft tissue swelling/ edema and fluid suggesting severe cellulitis without focal drainable soft tissue abscess. 2. Fatty atrophy of the foot musculature and mild myositis but no findings for pyomyositis. 3. No MR findings to suggest osteomyelitis or septic arthritis.   Electronically Signed   By: Loralie Champagne M.D.   On: 01/10/2013 12:09     Medications: Scheduled: . aspirin EC  325 mg Oral Daily  . atorvastatin  80 mg Oral Daily  . Chlorhexidine Gluconate Cloth  6 each Topical Q0600  . clopidogrel  75 mg Oral Q breakfast  . diltiazem  180 mg Oral BID  . enoxaparin (LOVENOX) injection  40 mg Subcutaneous Q24H  . feeding supplement  30 mL Oral BID WC  . furosemide  20 mg Intravenous BID  . insulin aspart  0-15 Units Subcutaneous TID WC  . insulin aspart  0-5 Units Subcutaneous QHS  . insulin aspart  3 Units Subcutaneous TID WC  . insulin detemir  45 Units Subcutaneous QHS  . labetalol  200 mg Oral BID  . lisinopril  10 mg Oral BID  . multivitamin with minerals  1 tablet Oral Daily  . mupirocin ointment  1 application Nasal BID   . piperacillin-tazobactam (ZOSYN)  IV  3.375 g Intravenous Q8H  . saccharomyces boulardii  250 mg Oral BID  . silver sulfADIAZINE   Topical Daily  . spironolactone  25 mg Oral Daily  . vancomycin  1,500 mg Intravenous Q12H   Continuous: . sodium chloride 30 mL/hr at 01/10/13 2209     Assessment/Plan: Active Problems:   Cellulitis   Sepsis   Elevated troponin   Hyponatremia   Hyperglycemia   Elevated brain natriuretic peptide (BNP) level   Elevated lactic acid level   Confusion  RLE Cellulitis/Toe Ulcer - IV Abx.   Dr Lajoyce Corners input appreciated.  No Osteo on MRI. Continue wound care and wraps. LE Venous Duplex were (-) and no DVT. RLE ABI = .22  Dr Hart Rochester has done R Leg Saph Vein ablation in past.   Elevated troponin due to sepsis +/- Demand Ischemia.  Dr Antoine Poche.  Current EF is worse than baseline 1. Peak Trop 2.57. 2. Remains on DVT Proph dosing anticoagulation. 3. Continue other cardiac medications  4. Brief run of SVT noted by cards -- H/O prior SVT.  CAD/Ischemic CM/Myocardial infarction (1987)/CABG 4 v (1993) -- c Demand ischemia and Acute CHF.  Prior EF was 45%.  Current ECHo down to 25-30%.  DD.  Wall motion issues noted.  Mild LVH.  Too Big for MUGA. ?need for outpt stress testing.  AKI - Mild Cr bump up to 1.4 - now 1.36 and baseline is 1.2. Watch c diuresis.  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  Morbid Obesity - Needs weight loss and wound healing. Prostat liquid protein 30 ml twice daily with meals (100 kcals, 15 gm protein per dose)  Continue multivitamin with minerals daily RD to follow for nutrition care plan  HTN - BP fine.  Hyperlipidemia.  OSA - CPAP 19. Dr Maple Hudson has been helping manage this as outpatient.  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 - Levimir and ISS here only -   Recent Labs  01/10/13 1425 01/10/13 1656 01/10/13 2012  GLUCAP 190* 134* 213*   ID -  Anti-infectives   Start      Dose/Rate Route Frequency Ordered Stop   01/08/13 1000  vancomycin (VANCOCIN) 1,500 mg in sodium chloride 0.9 % 500 mL IVPB     1,500 mg 250 mL/hr over 120 Minutes Intravenous Every 12 hours 01/08/13 0945     01/08/13 1000  piperacillin-tazobactam (ZOSYN) IVPB 3.375 g     3.375 g 12.5 mL/hr over 240 Minutes Intravenous 3 times per day 01/08/13 0945     01/07/13 2015  vancomycin (VANCOCIN) 2,000 mg in sodium chloride 0.9 % 500 mL IVPB     2,000 mg 250 mL/hr over 120 Minutes Intravenous STAT 01/07/13 2012 01/07/13 2344   01/07/13 2015  piperacillin-tazobactam (ZOSYN) IVPB 3.375 g     3.375 g 100 mL/hr over 30 Minutes Intravenous STAT 01/07/13 2012 01/07/13 2145     DVT Prophylaxis - on Lovenox.  Anticipate 1-3 more days of IV Abx (although Vanco may be stopped tomorrow?) then change to oral and transition to outpt.    LOS: 4 days   Lacrisha Bielicki M 01/11/2013, 7:36 AM

## 2013-01-12 LAB — GLUCOSE, CAPILLARY
Glucose-Capillary: 176 mg/dL — ABNORMAL HIGH (ref 70–99)
Glucose-Capillary: 173 mg/dL — ABNORMAL HIGH (ref 70–99)
Glucose-Capillary: 192 mg/dL — ABNORMAL HIGH (ref 70–99)

## 2013-01-12 LAB — CBC WITH DIFFERENTIAL/PLATELET
Basophils Absolute: 0.1 10*3/uL (ref 0.0–0.1)
Eosinophils Absolute: 0.1 10*3/uL (ref 0.0–0.7)
Eosinophils Relative: 1 % (ref 0–5)
HCT: 31.7 % — ABNORMAL LOW (ref 39.0–52.0)
Lymphocytes Relative: 16 % (ref 12–46)
MCH: 33 pg (ref 26.0–34.0)
MCHC: 34.7 g/dL (ref 30.0–36.0)
Monocytes Relative: 11 % (ref 3–12)
Neutro Abs: 6.6 10*3/uL (ref 1.7–7.7)
Neutrophils Relative %: 71 % (ref 43–77)
RDW: 14.9 % (ref 11.5–15.5)
WBC: 9.3 10*3/uL (ref 4.0–10.5)

## 2013-01-12 LAB — COMPREHENSIVE METABOLIC PANEL
ALT: 25 U/L (ref 0–53)
AST: 33 U/L (ref 0–37)
Albumin: 2.3 g/dL — ABNORMAL LOW (ref 3.5–5.2)
Alkaline Phosphatase: 76 U/L (ref 39–117)
BUN: 22 mg/dL (ref 6–23)
CO2: 23 mEq/L (ref 19–32)
Calcium: 8.5 mg/dL (ref 8.4–10.5)
Chloride: 100 mEq/L (ref 96–112)
GFR calc Af Amer: 57 mL/min — ABNORMAL LOW (ref >90)
GFR calc non Af Amer: 49 mL/min — ABNORMAL LOW (ref >90)
Glucose, Bld: 181 mg/dL — ABNORMAL HIGH (ref 70–99)
Potassium: 3.7 mEq/L (ref 3.5–5.1)
Sodium: 135 mEq/L (ref 135–145)
Total Protein: 6.5 g/dL (ref 6.0–8.3)

## 2013-01-12 MED ORDER — DOXYCYCLINE HYCLATE 100 MG PO TABS: 100.0000 mg | ORAL_TABLET | Freq: Two times a day (BID) | ORAL | Status: DC

## 2013-01-12 MED ORDER — DOXYCYCLINE HYCLATE 100 MG PO TABS
100.0000 mg | ORAL_TABLET | Freq: Two times a day (BID) | ORAL | Status: DC
  Administered 2013-01-13: 100 mg via ORAL
  Filled 2013-01-12 (×2): qty 1

## 2013-01-12 MED ORDER — FUROSEMIDE 10 MG/ML IJ SOLN
40.0000 mg | Freq: Two times a day (BID) | INTRAMUSCULAR | Status: DC
  Administered 2013-01-12 – 2013-01-13 (×3): 40 mg via INTRAVENOUS
  Filled 2013-01-12 (×3): qty 4

## 2013-01-12 MED ORDER — FUROSEMIDE 20 MG PO TABS: 20.0000 mg | ORAL_TABLET | Freq: Every day | ORAL | Status: DC

## 2013-01-12 MED ORDER — SACCHAROMYCES BOULARDII 250 MG PO CAPS: 250.0000 mg | ORAL_CAPSULE | Freq: Two times a day (BID) | ORAL | Status: DC

## 2013-01-12 MED ORDER — ACETAMINOPHEN 325 MG PO TABS: 650.0000 mg | ORAL_TABLET | Freq: Four times a day (QID) | ORAL | Status: DC | PRN

## 2013-01-12 MED ORDER — LISINOPRIL 40 MG PO TABS: 20.0000 mg | ORAL_TABLET | Freq: Every day | ORAL | Status: DC

## 2013-01-12 NOTE — Progress Notes (Signed)
Patient ID: Kerry Waters, male   DOB: 07-Sep-1945, 67 y.o.   MRN: [id number] Ulceration right great toe is healing nicely. Plan for Profore wrap dressing change next week. I will followup in the office for serial dressing changes. Okay for discharge to home on by mouth antibiotics.

## 2013-01-12 NOTE — Progress Notes (Signed)
Subjective: Doing well on floor. Improving. No Osteo on MRI. No new complaints.  Objective: Vital signs in last 24 hours: Temp:  [97.6 F (36.4 C)-99.2 F (37.3 C)] 98.9 F (37.2 C) (09/26 0535) Pulse Rate:  [75-77] 75 (09/26 0535) Resp:  [20] 20 (09/25 2050) BP: (126-144)/(63-69) 144/69 mmHg (09/26 0535) SpO2:  [96 %] 96 % (09/26 0535) Weight change:  Last BM Date: 01/11/13  CBG (last 3)   Recent Labs  01/11/13 1134 01/11/13 1619 01/11/13 2155  GLUCAP 176* 173* 181*    Intake/Output from previous day:  Intake/Output Summary (Last 24 hours) at 01/12/13 0732 Last data filed at 01/12/13 0405  Gross per 24 hour  Intake    880 ml  Output   1753 ml  Net   -873 ml   09/25 0701 - 09/26 0700 In: 880 [P.O.:880] Out: 1753 [Urine:1751; Stool:2]   Physical Exam General appearance: Obese. Alert.  Back to his normal. Resp: More clear Cardio: Reg. Sternotomy.  GI: Obese. soft, non-tender; bowel sounds normal; no masses,  no organomegaly Extremities: 1-2+ Edema R>L.  CVI LLE.  RLE c sig Cellulitis to below knee starting to improve tender to deep touch.  Wrap in place.  R Great toe scab no pus and looks better than anticipated.  Lab Results:  Recent Labs  01/11/13 0730 01/12/13 0530  NA 132* 135  K 3.8 3.7  CL 98 100  CO2 20 23  GLUCOSE 202* 181*  BUN 21 22  CREATININE 1.35 1.43*  CALCIUM 8.8 8.5     Recent Labs  01/11/13 0730 01/12/13 0530  AST 37 33  ALT 26 25  ALKPHOS 113 76  BILITOT 0.6 0.4  PROT 7.0 6.5  ALBUMIN 2.6* 2.3*     Recent Labs  01/11/13 0730 01/12/13 0530  WBC 11.6* 9.3  NEUTROABS 9.3* 6.6  HGB 11.2* 11.0*  HCT 32.1* 31.7*  MCV 93.9 95.2  PLT 145* 153    No results found for this basename: INR,  PROTIME    No results found for this basename: CKTOTAL, CKMB, CKMBINDEX, TROPONINI,  in the last 72 hours  No results found for this basename: TSH, T4TOTAL, FREET3, T3FREE, THYROIDAB,  in the last 72 hours  No results found for  this basename: VITAMINB12, FOLATE, FERRITIN, TIBC, IRON, RETICCTPCT,  in the last 72 hours  Micro Results: Recent Results (from the past 240 hour(s))  CULTURE, BLOOD (ROUTINE X 2)     Status: None   Collection Time    01/07/13  7:40 PM      Result Value Range Status   Specimen Description BLOOD RIGHT ARM   Final   Special Requests BOTTLES DRAWN AEROBIC AND ANAEROBIC 8CC EA   Final   Culture  Setup Time     Final   Value: 01/08/2013 04:11     Performed at [facility]   Culture     Final   Value:        BLOOD CULTURE RECEIVED NO GROWTH TO DATE CULTURE WILL BE HELD FOR 5 DAYS BEFORE ISSUING A FINAL NEGATIVE REPORT     Performed at [facility]   Report Status PENDING   Incomplete  CULTURE, BLOOD (ROUTINE X 2)     Status: None   Collection Time    01/07/13  7:45 PM      Result Value Range Status   Specimen Description BLOOD RIGHT ARM   Final   Special Requests BOTTLES DRAWN AEROBIC AND ANAEROBIC 5CC  EA   Final   Culture  Setup Time     Final   Value: 01/08/2013 04:11     Performed at [facility]   Culture     Final   Value:        BLOOD CULTURE RECEIVED NO GROWTH TO DATE CULTURE WILL BE HELD FOR 5 DAYS BEFORE ISSUING A FINAL NEGATIVE REPORT     Performed at [facility]   Report Status PENDING   Incomplete  MRSA PCR SCREENING     Status: Abnormal   Collection Time    01/08/13  6:21 AM      Result Value Range Status   MRSA by PCR POSITIVE (*) NEGATIVE Final   Comment:            The GeneXpert MRSA Assay (FDA     approved for NASAL specimens     only), is one component of a     comprehensive MRSA colonization     surveillance program. It is not     intended to diagnose MRSA     infection nor to guide or     monitor treatment for     MRSA infections.     RESULT CALLED TO, READ BACK BY AND VERIFIED WITH:     H MILLS,RN 9/22 0747 BY K SCHULTZ     Studies/Results: Mr Toes Right Wo/w Cm  01/10/2013   CLINICAL DATA:  Great toe ulceration.   EXAM: MRI OF THE RIGHT TOES WITHOUT AND WITH CONTRAST  TECHNIQUE: Multiplanar, multisequence MR imaging was performed both before and after administration of intravenous contrast.  CONTRAST:  20mL MULTIHANCE GADOBENATE DIMEGLUMINE 529 MG/ML IV SOLN  COMPARISON:  Radiographs 01/08/2013.  FINDINGS: There is a massive dorsal soft tissue swelling/ edema and fluid involving the entire forefoot. This likely reflects cellulitis. Mild diffuse fatty atrophy involving the forefoot musculature with mild myositis. No focal drainable soft tissue abscess or findings for pyomyositis.  The bony structures are intact. No MR findings to suggest osteomyelitis or septic arthritis. Mild degenerative changes noted at the 1st metatarsophalangeal joint. The flexor and extensor tendons are intact.  IMPRESSION: 1. Marked dorsal subcutaneous soft tissue swelling/ edema and fluid suggesting severe cellulitis without focal drainable soft tissue abscess. 2. Fatty atrophy of the foot musculature and mild myositis but no findings for pyomyositis. 3. No MR findings to suggest osteomyelitis or septic arthritis.   Electronically Signed   By: Loralie Champagne Waters.D.   On: 01/10/2013 12:09     Medications: Scheduled: . aspirin EC  325 mg Oral Daily  . atorvastatin  80 mg Oral Daily  . clopidogrel  75 mg Oral Q breakfast  . diltiazem  180 mg Oral BID  . enoxaparin (LOVENOX) injection  40 mg Subcutaneous Q24H  . feeding supplement  30 mL Oral BID WC  . furosemide  20 mg Intravenous BID  . insulin aspart  0-15 Units Subcutaneous TID WC  . insulin aspart  0-5 Units Subcutaneous QHS  . insulin aspart  3 Units Subcutaneous TID WC  . insulin detemir  45 Units Subcutaneous QHS  . labetalol  200 mg Oral BID  . lisinopril  10 mg Oral BID  . multivitamin with minerals  1 tablet Oral Daily  . mupirocin ointment  1 application Nasal BID  . piperacillin-tazobactam (ZOSYN)  IV  3.375 g Intravenous Q8H  . saccharomyces boulardii  250 mg Oral BID  .  silver sulfADIAZINE   Topical Daily  .  spironolactone  25 mg Oral Daily  . vancomycin  1,500 mg Intravenous Q12H   Continuous: . sodium chloride 30 mL/hr at 01/10/13 2209     Assessment/Plan: Active Problems:   Cellulitis   Sepsis   Elevated troponin   Hyponatremia   Hyperglycemia   Elevated brain natriuretic peptide (BNP) level   Elevated lactic acid level   Confusion  RLE Cellulitis/Toe Ulcer - IV Abx.   Dr Lajoyce Corners input appreciated.  No Osteo on MRI. Continue wound care and wraps.  He stated "Ulceration right great toe is healing nicely. Plan for Profore wrap dressing change next week. I will followup in the office for serial dressing changes. Okay for discharge to home on by mouth antibiotics."  I have him getting one more Vanco and 2 more Zosyn and will switch to Oral Doxy tomorrow.  He still has sig RLE edema and cellulitis up his leg.  If that resolves a little more he can go home. LE Venous Duplex were (-) and no DVT. RLE ABI = .18  Dr Hart Rochester has done R Leg Saph Vein ablation in past.   Elevated troponin due to sepsis +/- Demand Ischemia.  Dr Antoine Poche.  Current EF is worse than baseline - I will push diuresis some more today. 1. Peak Trop 2.57. 2. Remains on DVT Proph dosing anticoagulation. 3. Continue other cardiac medications  4. Brief run of SVT noted by cards -- H/O prior SVT.  CAD/Ischemic CM/Myocardial infarction (1987)/CABG 4 v (1993) -- c Demand ischemia and Acute CHF.  Prior EF was 45%.  Current ECHo down to 25-30%.  DD.  Wall motion issues noted.  Mild LVH.  Too Big for MUGA. ?need for outpt stress testing.  AKI - Mild Cr bump up to 1.4 - Stable and baseline is 1.2. Watch c diuresis.  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  Morbid Obesity - Needs weight loss and wound healing. Prostat liquid protein 30 ml twice daily with meals (100 kcals, 15 gm protein per dose)  Continue multivitamin with minerals daily RD to follow for nutrition  care plan  HTN - BP no longer soft and can tolerate more diuresis.  Hyperlipidemia.  OSA - CPAP 19. Dr Maple Hudson has been helping manage this as outpatient.  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  On D/c ok for [place].  Will hold amaryl and Metformin.     Recent Labs  01/11/13 1134 01/11/13 1619 01/11/13 2155  GLUCAP 176* 173* 181*   ID -  Anti-infectives   Start     Dose/Rate Route Frequency Ordered Stop   01/08/13 1000  vancomycin (VANCOCIN) 1,500 mg in sodium chloride 0.9 % 500 mL IVPB     1,500 mg 250 mL/hr over 120 Minutes Intravenous Every 12 hours 01/08/13 0945     01/08/13 1000  piperacillin-tazobactam (ZOSYN) IVPB 3.375 g     3.375 g 12.5 mL/hr over 240 Minutes Intravenous 3 times per day 01/08/13 0945     01/07/13 2015  vancomycin (VANCOCIN) 2,000 mg in sodium chloride 0.9 % 500 mL IVPB     2,000 mg 250 mL/hr over 120 Minutes Intravenous STAT 01/07/13 2012 01/07/13 2344   01/07/13 2015  piperacillin-tazobactam (ZOSYN) IVPB 3.375 g     3.375 g 100 mL/hr over 30 Minutes Intravenous STAT 01/07/13 2012 01/07/13 2145     DVT Prophylaxis - on Lovenox.  Anticipate  1 more days of IV Abx (although Vanco may be stopped tomorrow?) then change to oral and transition to outpt based on how much volume he has and how the leg looks.    LOS: 5 days   Kerry Waters 01/12/2013, 7:32 AM

## 2013-01-13 DIAGNOSIS — I5022 Chronic systolic (congestive) heart failure: Secondary | ICD-10-CM | POA: Diagnosis present

## 2013-01-13 DIAGNOSIS — I10 Essential (primary) hypertension: Secondary | ICD-10-CM

## 2013-01-13 LAB — GLUCOSE, CAPILLARY
Glucose-Capillary: 194 mg/dL — ABNORMAL HIGH (ref 70–99)
Glucose-Capillary: 154 mg/dL — ABNORMAL HIGH (ref 70–99)

## 2013-01-13 LAB — COMPREHENSIVE METABOLIC PANEL
ALT: 26 U/L (ref 0–53)
AST: 34 U/L (ref 0–37)
Albumin: 2.3 g/dL — ABNORMAL LOW (ref 3.5–5.2)
Alkaline Phosphatase: 76 U/L (ref 39–117)
BUN: 24 mg/dL — ABNORMAL HIGH (ref 6–23)
Calcium: 8.8 mg/dL (ref 8.4–10.5)
Chloride: 99 mEq/L (ref 96–112)
Potassium: 4.1 mEq/L (ref 3.5–5.1)
Sodium: 134 mEq/L — ABNORMAL LOW (ref 135–145)
Total Bilirubin: 0.5 mg/dL (ref 0.3–1.2)
Total Protein: 6.8 g/dL (ref 6.0–8.3)

## 2013-01-13 LAB — CBC WITH DIFFERENTIAL/PLATELET
Basophils Absolute: 0.1 10*3/uL (ref 0.0–0.1)
Eosinophils Relative: 2 % (ref 0–5)
HCT: 30.4 % — ABNORMAL LOW (ref 39.0–52.0)
Lymphs Abs: 1.6 10*3/uL (ref 0.7–4.0)
MCH: 32.2 pg (ref 26.0–34.0)
MCV: 95 fL (ref 78.0–100.0)
Monocytes Absolute: 1 10*3/uL (ref 0.1–1.0)
Monocytes Relative: 10 % (ref 3–12)
Neutro Abs: 7.5 10*3/uL (ref 1.7–7.7)
Neutrophils Relative %: 72 % (ref 43–77)
RDW: 14.8 % (ref 11.5–15.5)
WBC: 10.4 10*3/uL (ref 4.0–10.5)

## 2013-01-13 MED ORDER — FUROSEMIDE 20 MG PO TABS: 20.0000 mg | ORAL_TABLET | Freq: Every day | ORAL | Status: DC

## 2013-01-13 MED ORDER — LISINOPRIL 40 MG PO TABS: 20.0000 mg | ORAL_TABLET | Freq: Every day | ORAL | Status: DC

## 2013-01-13 MED ORDER — SILVER SULFADIAZINE 1 % EX CREA: TOPICAL_CREAM | Freq: Every day | CUTANEOUS | Status: DC

## 2013-01-13 MED ORDER — DOXYCYCLINE HYCLATE 100 MG PO TABS: 100.0000 mg | ORAL_TABLET | Freq: Two times a day (BID) | ORAL | Status: DC

## 2013-01-13 MED ORDER — CARVEDILOL 12.5 MG PO TABS
12.5000 mg | ORAL_TABLET | Freq: Two times a day (BID) | ORAL | Status: DC
  Administered 2013-01-13: 12.5 mg via ORAL
  Filled 2013-01-13 (×3): qty 1

## 2013-01-13 MED ORDER — CARVEDILOL 12.5 MG PO TABS: 12.5000 mg | ORAL_TABLET | Freq: Two times a day (BID) | ORAL | Status: DC

## 2013-01-13 NOTE — Progress Notes (Signed)
SUBJECTIVE:  The patient denies any chest discomfort or shortness of breath.   PHYSICAL EXAM Filed Vitals:   01/12/13 1011 01/12/13 1433 01/12/13 2030 01/13/13 0500  BP: 152/66 139/52 163/64 143/57  Pulse: 74 74 79 59  Temp:  98.6 F (37 C) 100.8 F (38.2 C) 98.7 F (37.1 C)  TempSrc:    Oral  Resp:  18 18 18   Height:      Weight:      SpO2:  96% 94% 97%   General:  No distress Lungs: No crackles.  Heart:  RRR Abdomen:  Positive bowel sounds, no rebound no guarding Extremities:  Right greater than left leg edema and erythema unchanged    LABS: Lab Results  Component Value Date   TROPONINI 2.57* 01/08/2013   Results for orders placed during the hospital encounter of 01/07/13 (from the past 24 hour(s))  GLUCOSE, CAPILLARY     Status: Abnormal   Collection Time    01/12/13 11:08 AM      Result Value Range   Glucose-Capillary 194 (*) 70 - 99 mg/dL   Comment 1 Documented in Chart     Comment 2 Notify RN    GLUCOSE, CAPILLARY     Status: Abnormal   Collection Time    01/12/13  4:46 PM      Result Value Range   Glucose-Capillary 192 (*) 70 - 99 mg/dL  GLUCOSE, CAPILLARY     Status: Abnormal   Collection Time    01/12/13  8:28 PM      Result Value Range   Glucose-Capillary 176 (*) 70 - 99 mg/dL  COMPREHENSIVE METABOLIC PANEL     Status: Abnormal   Collection Time    01/13/13  6:50 AM      Result Value Range   Sodium 134 (*) 135 - 145 mEq/L   Potassium 4.1  3.5 - 5.1 mEq/L   Chloride 99  96 - 112 mEq/L   CO2 23  19 - 32 mEq/L   Glucose, Bld 204 (*) 70 - 99 mg/dL   BUN 24 (*) 6 - 23 mg/dL   Creatinine, Ser 1.61 (*) 0.50 - 1.35 mg/dL   Calcium 8.8  8.4 - 09.6 mg/dL   Total Protein 6.8  6.0 - 8.3 g/dL   Albumin 2.3 (*) 3.5 - 5.2 g/dL   AST 34  0 - 37 U/L   ALT 26  0 - 53 U/L   Alkaline Phosphatase 76  39 - 117 U/L   Total Bilirubin 0.5  0.3 - 1.2 mg/dL   GFR calc non Af Amer 44 (*) >90 mL/min   GFR calc Af Amer 51 (*) >90 mL/min  CBC WITH DIFFERENTIAL      Status: Abnormal   Collection Time    01/13/13  6:50 AM      Result Value Range   WBC 10.4  4.0 - 10.5 K/uL   RBC 3.20 (*) 4.22 - 5.81 MIL/uL   Hemoglobin 10.3 (*) 13.0 - 17.0 g/dL   HCT 04.5 (*) 40.9 - 81.1 %   MCV 95.0  78.0 - 100.0 fL   MCH 32.2  26.0 - 34.0 pg   MCHC 33.9  30.0 - 36.0 g/dL   RDW 91.4  78.2 - 95.6 %   Platelets 192  150 - 400 K/uL   Neutrophils Relative % 72  43 - 77 %   Lymphocytes Relative 15  12 - 46 %   Monocytes Relative 10  3 - 12 %  Eosinophils Relative 2  0 - 5 %   Basophils Relative 1  0 - 1 %   Neutro Abs 7.5  1.7 - 7.7 K/uL   Lymphs Abs 1.6  0.7 - 4.0 K/uL   Monocytes Absolute 1.0  0.1 - 1.0 K/uL   Eosinophils Absolute 0.2  0.0 - 0.7 K/uL   Basophils Absolute 0.1  0.0 - 0.1 K/uL   RBC Morphology POLYCHROMASIA PRESENT     WBC Morphology ATYPICAL LYMPHOCYTES    GLUCOSE, CAPILLARY     Status: Abnormal   Collection Time    01/13/13  7:51 AM      Result Value Range   Glucose-Capillary 194 (*) 70 - 99 mg/dL   Comment 1 Notify RN      Intake/Output Summary (Last 24 hours) at 01/13/13 0953 Last data filed at 01/13/13 0546  Gross per 24 hour  Intake    650 ml  Output   4000 ml  Net  -3350 ml    ASSESSMENT AND PLAN:  1. Elevated troponin:  History of CAD. Asymptomatic, unchanged ECG. Cath is certainly not indicated. He has had no recent unstable symptoms.    I am not planning any inpatient stress testing but I will consider this as an outpatient.    2. Ischemic cardiomyopathy:  EF 25% on echo.   He weighted too much for the MUGA.  I am not sure if this EF is really 25% as the images on echo were very poor.  I do not know whether this represents a lower EF than baseline.  IV diuretic given yesterday.  IO probably incomplete.  Weight unchanged.  I agree with continuing current IV Lasix.  The creatinine seems to be tolerating this.    3. Hypertension - we will changed Labetalol to Carvedilol 12.5 mg BID   Kerry Waters, H 01/13/2013 9:53  AM

## 2013-01-13 NOTE — Progress Notes (Signed)
   CARE MANAGEMENT NOTE 01/13/2013  Patient:  Kerry Waters, Kerry Waters   Account Number:  [id number]  Date Initiated:  01/08/2013  Documentation initiated by:  Donn Pierini  Subjective/Objective Assessment:   Pt admitted with sepsis and cellulitis     Action/Plan:   PTA pt lived at home with spouse- NCM to follow pt progress for d/c needs- as may need surgery on this admission   Anticipated DC Date:  01/12/2013   Anticipated DC Plan:        DC Planning Services  CM consult      [facility] Choice  HOME HEALTH   Choice offered to / List presented to:  C-1 Patient        HH arranged  HH-1 RN  HH-2 PT  HH-3 OT      [facility] agency  [facility]   Status of service:  Completed, signed off Medicare Important Message given?   (If response is "NO", the following Medicare IM given date fields will be blank) Date Medicare IM given:   Date Additional Medicare IM given:    Discharge Disposition:    Per UR Regulation:  Reviewed for med. necessity/level of care/duration of stay  If discussed at Long Length of Stay Meetings, dates discussed:    Comments:  01/13/13 14:35 CM spoke with pt and offered choice for HH/PT/OT/RN for wound care.  Referral faxed to [facility]. Address verified and contact numbers verified. No DME needed.  No other CM needs were communicated.  Freddy Jaksch, BSN, CM [phone].    01-11-13 [address], [place] [phone] RLE Cellulitis/Toe Ulcer - IV Abx. Per MD notes-  No Osteo on MRI. Plan to continue wound care and wraps. LE Venous Duplex were (-) and no DVT. Plan- Anticipate 1-3 more days of IV Abx. CM will ocnitnue to monitor for disposition needs.

## 2013-01-13 NOTE — Discharge Summary (Signed)
DISCHARGE SUMMARY  Kerry Waters  MR#: [id number]  DOB:1945-10-13  Date of Admission: 01/07/2013 Date of Discharge: 01/13/2013  Attending Physician:SHAW,W DOUGLAS  Patient's WUJ:WJXBJ,YNWG M, MD  Consults: [facility] Aldean Baker, MD-Orthopaedics  Discharge Diagnoses: Active Problems:   Sepsis Syndrome   Ulcer of leg, chronic, right   Cellulitis    Peripheral Vascular Disease   CORONARY HEART DISEASE   Obstructive sleep apnea on CPAP   SVT (supraventricular tachycardia)   Elevated troponin/NSTEMI   Hyponatremia   Hyperglycemia   Elevated lactic acid level   Confusion   Essential hypertension   Acute on chronic systolic congestive heart failure (EF 25%)  Past Medical History  Diagnosis Date  . Joint pain   . Ulcer of foot   . Ischemic heart disease   . MI (myocardial infarction) 1987    ANTERIOR SEPTAL  . Hypokinesis     MILD INFERIOR  . Hypertension   . Edema of lower extremity   . Obesities, morbid   . SVT (supraventricular tachycardia)   . Hx of CABG 1993  . Abnormal cardiovascular stress test 2008    EF 48%. No clear cut ischemia. Low risk scan  . Diabetes mellitus     Dr. Timothy Lasso follows.  [name] PVD (peripheral vascular disease)   . Venous stasis   . Peripheral neuropathy   . Hyperlipidemia   . Coronary artery disease   . Sleep apnea     CPAP  . History of angina   . Cancer     basil cell carcinoma  . Stroke     pt states possible stroke on 04/25/2012   Past Surgical History  Procedure Laterality Date  . Coronary artery bypass graft  1993    LIMA to LAD with patch angioplasty, SVG to OM, SVG to OM & PD and patch angioplasy to PDA  . Left ankle orif  2000     Discharge Medications:   Medication List    STOP taking these medications       cefdinir 300 MG capsule  Commonly known as:  OMNICEF     glimepiride 4 MG tablet  Commonly known as:  AMARYL     hydrochlorothiazide 25 MG tablet  Commonly known as:  HYDRODIURIL      ibuprofen 200 MG tablet  Commonly known as:  ADVIL,MOTRIN     labetalol 200 MG tablet  Commonly known as:  NORMODYNE     metFORMIN 1000 MG tablet  Commonly known as:  GLUCOPHAGE     multivitamin tablet     naproxen sodium 220 MG tablet  Commonly known as:  ANAPROX      TAKE these medications       acetaminophen 325 MG tablet  Commonly known as:  TYLENOL  Take 2 tablets (650 mg total) by mouth every 6 (six) hours as needed.     aspirin EC 325 MG tablet  Take 325 mg by mouth daily.     atorvastatin 80 MG tablet  Commonly known as:  LIPITOR  Take 80 mg by mouth daily.     BYETTA 5 MCG PEN [place]  Inject 10 mg into the skin 2 (two) times daily.     carvedilol 12.5 MG tablet  Commonly known as:  COREG  Take 1 tablet (12.5 mg total) by mouth 2 (two) times daily with a meal.     clopidogrel 75 MG tablet  Commonly known as:  PLAVIX  Take 75 mg by mouth daily.  Coenzyme Q10 300 MG Caps  Take 1 capsule by mouth daily.     diltiazem 180 MG 24 hr capsule  Commonly known as:  CARDIZEM CD  Take 180 mg by mouth 2 (two) times daily.     doxycycline 100 MG tablet  Commonly known as:  VIBRA-TABS  Take 1 tablet (100 mg total) by mouth every 12 (twelve) hours.     furosemide 20 MG tablet  Commonly known as:  LASIX  Take 1 tablet (20 mg total) by mouth daily.     insulin detemir 100 UNIT/ML injection  Commonly known as:  LEVEMIR  Inject 45 Units into the skin at bedtime.     INVOKANA 100 MG Tabs  Generic drug:  Canagliflozin  Take 100 mg by mouth daily.     lisinopril 40 MG tablet  Commonly known as:  PRINIVIL,ZESTRIL  Take 0.5 tablets (20 mg total) by mouth daily.     multivitamin with minerals Tabs tablet  Take 1 tablet by mouth daily.     saccharomyces boulardii 250 MG capsule  Commonly known as:  FLORASTOR  Take 1 capsule (250 mg total) by mouth 2 (two) times daily.     silver sulfADIAZINE 1 % cream  Commonly known as:  SILVADENE  Apply topically daily.      spironolactone 25 MG tablet  Commonly known as:  ALDACTONE  Take 25 mg by mouth daily.     vitamin C 500 MG tablet  Commonly known as:  ASCORBIC ACID  Take 500 mg by mouth daily.     Zinc 40 MG Tabs  Take 40 mg by mouth daily.        Hospital Procedures: Dg Chest 2 View  01/07/2013   CLINICAL DATA:  Chest pain status post fall. History of hypertension and diabetes.  EXAM: CHEST  2 VIEW  COMPARISON:  06/02/2010.  FINDINGS: The heart size and mediastinal contours are stable allowing for lordotic positioning. Patient is status post CABG. The upper sternotomy wires are fractured. The lungs are clear. There is no pleural effusion or pneumothorax. Telemetry leads overlie the chest. Mild thoracic spine degenerative changes are stable.  IMPRESSION: Stable postoperative chest. No acute cardiopulmonary process.   Electronically Signed   By: Roxy Horseman   On: 01/07/2013 19:24   Dg Pelvis 1-2 Views  01/07/2013   CLINICAL DATA:  Joint pain status post fall.  EXAM: PELVIS - 1-2 VIEW  COMPARISON:  None.  FINDINGS: The mineralization and alignment are normal. There is no evidence of acute fracture or dislocation. The hip joint spaces are preserved. Scattered vascular calcifications are noted.  IMPRESSION: No acute osseous findings.   Electronically Signed   By: Roxy Horseman   On: 01/07/2013 19:19   Ct Head Wo Contrast  01/07/2013   CLINICAL DATA:  Confusion; recent trauma; fever  EXAM: CT HEAD WITHOUT CONTRAST  TECHNIQUE: Contiguous axial images were obtained from the base of the skull through the vertex without intravenous contrast. Study was obtained within 24 hr of patient's arrival at the emergency department.  COMPARISON:  May 23, 2012  FINDINGS: Ventricles are normal in size and configuration. There is no mass, hemorrhage, extra-axial fluid collection, or midline shift. There is evidence of old infarct in the medial right occipital lobe. Elsewhere, gray-white compartments are normal. There is no  demonstrable acute infarct.  Bony calvarium appears intact. The mastoid air cells are clear.  IMPRESSION: Remote medial aspect right occipital lobe infarct. Study otherwise unremarkable. In particular,  there is no appreciable mass, hemorrhage, or acute appearing infarct.   Electronically Signed   By: Bretta Bang   On: 01/07/2013 19:26   Mr Toes Right Wo/w Cm  01/10/2013   CLINICAL DATA:  Great toe ulceration.  EXAM: MRI OF THE RIGHT TOES WITHOUT AND WITH CONTRAST  TECHNIQUE: Multiplanar, multisequence MR imaging was performed both before and after administration of intravenous contrast.  CONTRAST:  20mL MULTIHANCE GADOBENATE DIMEGLUMINE 529 MG/ML IV SOLN  COMPARISON:  Radiographs 01/08/2013.  FINDINGS: There is a massive dorsal soft tissue swelling/ edema and fluid involving the entire forefoot. This likely reflects cellulitis. Mild diffuse fatty atrophy involving the forefoot musculature with mild myositis. No focal drainable soft tissue abscess or findings for pyomyositis.  The bony structures are intact. No MR findings to suggest osteomyelitis or septic arthritis. Mild degenerative changes noted at the 1st metatarsophalangeal joint. The flexor and extensor tendons are intact.  IMPRESSION: 1. Marked dorsal subcutaneous soft tissue swelling/ edema and fluid suggesting severe cellulitis without focal drainable soft tissue abscess. 2. Fatty atrophy of the foot musculature and mild myositis but no findings for pyomyositis. 3. No MR findings to suggest osteomyelitis or septic arthritis.   Electronically Signed   By: Loralie Champagne M.D.   On: 01/10/2013 12:09   Dg Foot Complete Right  01/08/2013   CLINICAL DATA:  Ulcer right great toe, diabetes, question osteomyelitis  EXAM: RIGHT FOOT COMPLETE - 3+ VIEW  COMPARISON:  02/24/2012 great toe radiographs, right foot radiographs 06/02/2010  FINDINGS: Dressing artifacts at great toe.  Diffuse osseous demineralization.  Joint spaces preserved.  Soft tissue  swelling greatest at dorsum of foot.  Cortices at the phalanges of the great toe appear intact.  No fracture, dislocation or bone destruction.  Specifically no definite evidence of bone destruction at the great toe to suggest osteomyelitis.  Small subchondral cyst is noted at the base of the middle phalanx of the 2nd toe.  Scattered atherosclerotic calcification at ankle.  IMPRESSION: No definite evidence of osteomyelitis at the great toe. Significant soft tissue swelling greatest at dorsum of right foot.  If there is persistent clinical concern for osteomyelitis, then recommend MR imaging of the right foot with and without contrast for further evaluation.   Electronically Signed   By: Ulyses Southward M.D.   On: 01/08/2013 13:10   Echocardiogram (9/22): - Left ventricle: The cavity size was normal. Wall thickness was increased in a pattern of mild LVH. Systolic function was severely reduced. The estimated ejection fraction was in the range of 25% to 30%. Mid to apical anteroseptal akinesis. Inferior hypokinesis. Anterior hypokinesis. Doppler parameters are consistent with abnormal left ventricular relaxation (grade 1 diastolic dysfunction). - Aortic valve: Trileaflet; moderately calcified leaflets. Sclerosis without stenosis. - Mitral valve: Moderately calcified annulus. No significant regurgitation. - Left atrium: The atrium was mildly dilated. - Right ventricle: Poorly visualized. - Pulmonary arteries: No complete TR doppler jet so unable to estimate PA systolic pressure. - Systemic veins: IVC was not visualized.   Bilateral lower venous Dopplers (9/22): Negative for DVT bilaterally   ABIs (9/23) Right ant tibial 97mm Hg 0.69 Monophasic +-----------------+--------+--------------+----------+ Right post tibial142mm Hg0.77 Monophasic +-----------------+--------+--------------+----------+ Left ant tibial Hg1.15 Triphasic   +-----------------+--------+--------------+----------+ Left post tibial Hg1.22 Triphasic   History of Present Illness (From Dr. Laurey Morale H&P): Rocky Link is a 67 yo man with pmhx as below. He has felt bad for the last 5 days. He may have had a fever in the last day or  so. He may have also had some confusion. He denies cp. He has had a normal appetite. In the ER he is found to have significant RLE acute cellulitis. He also has an elevated wbc as well as possible ecg changes and abnL trop I and bnp. He will require admission. 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 He has not felt driving. No pain right now. He fell last nigh possibly although the story is not clear on this as he denies it today.  Hospital Course: Mr. Weller was admitted to a step down bed for sepsis syndrome in the setting of right foot ulcer and cellulitis characterized by fever, leukocytosis, and confusion. He was treated empirically with vancomycin and Zosyn. Blood cultures were obtained and were negative. He did screen positive for MRSA. MRI was obtained of the right foot showing no signs of abscess or osteomyelitis. Orthopedics was consulted and recommended local wound care, Profore dressing, and continued antibiotics.  With this treatment, his fever, leukocytosis, and sepsis resolved. His right leg erythema and warmth improved. He'll be transitioned to oral doxycycline to complete a 14 day course of antibiotics with close outpatient followup.  In the setting of his sepsis he was noted to have a increase in his troponin to 2.57 consistent with a supply/demand non-ST elevation MI. His pro-BNP was markedly elevated at 17,379. An echocardiogram was performed that showed an ejection fraction of 25% the study was limited.  History medically with beta blocker, ACE inhibitor, and IV diuretics with significant improvement in his volume status. His creatinine slowly trended up to 1.57 on the day of discharge. He'll be  transitioned to oral Lasix and continued medical therapy. Of note, his labetalol was changed to Coreg.  Cardiology considered additional testing but options are limited given the patient's size and cardiac catheterization was not recommended. Further consideration of additional testing will occur as an outpatient.  At this time, the patient is stable for discharge. He will have close followup with orthopedics and primary care as an outpatient.  Day of Discharge Exam BP 143/57  Pulse 59  Temp(Src) 98.7 F (37.1 C) (Oral)  Resp 18  Ht 5\' 11"  (1.803 m)  Wt 151.4 kg (333 lb 12.4 oz)  BMI 46.57 kg/m2  SpO2 97%  Physical Exam: General appearance: alert and no distress Eyes: no scleral icterus Throat: oropharynx moist without erythema Resp: clear to auscultation bilaterally Cardio: regular rate and rhythm GI: soft, non-tender; bowel sounds normal; no masses,  no organomegaly Extremities: no clubbing, cyanosis; chronic venous stasis changes with right greater than left edema 1-2+. Right lower extremity erythema is receding. Profore wrap in place. Left lateral calf ulcer with granulation tissue and no surrounding erythema.   Discharge Labs:  Recent Labs  01/12/13 0530 01/13/13 0650  NA 135 134*  K 3.7 4.1  CL 100 99  CO2 23 23  GLUCOSE 181* 204*  BUN 22 24*  CREATININE 1.43* 1.57*  CALCIUM 8.5 8.8    Recent Labs  01/12/13 0530 01/13/13 0650  AST 33 34  ALT 25 26  ALKPHOS 76 76  BILITOT 0.4 0.5  PROT 6.5 6.8  ALBUMIN 2.3* 2.3*    Recent Labs  01/12/13 0530 01/13/13 0650  WBC 9.3 10.4  NEUTROABS 6.6 7.5  HGB 11.0* 10.3*  HCT 31.7* 30.4*  MCV 95.2 95.0  PLT 153 192    Peak troponin 2.70 on 9/21 proBNP- 17379 on 9/21 Blood Cultures negative  Discharge instructions:  Discharge Orders   Future Appointments Provider Department Dept Phone   01/29/2013 4:30 PM [facility] Previsit Rm 51 [facility] [phone]   02/12/2013 11:30 AM  Iva Boop, MD [facility] [phone]   12/27/2013 4:00 PM Waymon Budge, MD [facility] [phone]   Future Orders Complete By Expires   Change dressing (specify)  As directed    Comments:     Apply silvadene cream daily.  Profore compression dressing applied weekly by home health.   Diet - low sodium heart healthy  As directed    Diet Carb Modified  As directed    Discharge instructions  As directed    Comments:     Weigh daily and call if weight increases more than 3 lbs in 24 hour period or more than 5 lbs from baseline.  Continue wrap (to be changed by Home Health).  Call if increased redness, drainage, warmth or fever above 101.5   Increase activity slowly  As directed       Disposition: to home with wife and home health PT/OT, RN  Follow-up Appts: Follow-up with Dr. Timothy Lasso at [facility] within 1-2 weeks. Follow-up with Dr. Lajoyce Corners within 1 week  Condition on Discharge: stable  Tests Needing Follow-up: None  Time with discharge activities: 35 minutes  Signed: SHAW,W DOUGLAS 01/13/2013, 11:50 AM

## 2013-01-13 NOTE — Progress Notes (Signed)
01/13/13 1549 nsg Pt to d/c to home with home health orders; walker in room for patient to bring to home. Wife in room at this time.

## 2013-01-14 DIAGNOSIS — I2510 Atherosclerotic heart disease of native coronary artery without angina pectoris: Secondary | ICD-10-CM | POA: Diagnosis not present

## 2013-01-14 DIAGNOSIS — L97509 Non-pressure chronic ulcer of other part of unspecified foot with unspecified severity: Secondary | ICD-10-CM | POA: Diagnosis not present

## 2013-01-14 DIAGNOSIS — E1169 Type 2 diabetes mellitus with other specified complication: Secondary | ICD-10-CM | POA: Diagnosis not present

## 2013-01-14 DIAGNOSIS — Z794 Long term (current) use of insulin: Secondary | ICD-10-CM | POA: Diagnosis not present

## 2013-01-14 DIAGNOSIS — L02419 Cutaneous abscess of limb, unspecified: Secondary | ICD-10-CM | POA: Diagnosis not present

## 2013-01-14 DIAGNOSIS — I5023 Acute on chronic systolic (congestive) heart failure: Secondary | ICD-10-CM | POA: Diagnosis not present

## 2013-01-14 DIAGNOSIS — I10 Essential (primary) hypertension: Secondary | ICD-10-CM | POA: Diagnosis not present

## 2013-01-14 DIAGNOSIS — I83219 Varicose veins of right lower extremity with both ulcer of unspecified site and inflammation: Secondary | ICD-10-CM | POA: Diagnosis not present

## 2013-01-14 DIAGNOSIS — I509 Heart failure, unspecified: Secondary | ICD-10-CM | POA: Diagnosis not present

## 2013-01-14 LAB — CULTURE, BLOOD (ROUTINE X 2): Culture: NO GROWTH

## 2013-02-11 DIAGNOSIS — E785 Hyperlipidemia, unspecified: Secondary | ICD-10-CM | POA: Diagnosis not present

## 2013-02-11 DIAGNOSIS — I6350 Cerebral infarction due to unspecified occlusion or stenosis of unspecified cerebral artery: Secondary | ICD-10-CM | POA: Diagnosis not present

## 2013-02-11 DIAGNOSIS — I2589 Other forms of chronic ischemic heart disease: Secondary | ICD-10-CM | POA: Diagnosis not present

## 2013-02-11 DIAGNOSIS — A419 Sepsis, unspecified organism: Secondary | ICD-10-CM | POA: Diagnosis not present

## 2013-02-11 DIAGNOSIS — I2510 Atherosclerotic heart disease of native coronary artery without angina pectoris: Secondary | ICD-10-CM | POA: Diagnosis not present

## 2013-02-11 DIAGNOSIS — L97509 Non-pressure chronic ulcer of other part of unspecified foot with unspecified severity: Secondary | ICD-10-CM | POA: Diagnosis not present

## 2013-02-11 DIAGNOSIS — L02419 Cutaneous abscess of limb, unspecified: Secondary | ICD-10-CM | POA: Diagnosis not present

## 2013-02-11 DIAGNOSIS — E1149 Type 2 diabetes mellitus with other diabetic neurological complication: Secondary | ICD-10-CM | POA: Diagnosis not present

## 2013-02-12 ENCOUNTER — Encounter: Payer: Medicare Other | Admitting: Internal Medicine

## 2013-02-15 DIAGNOSIS — IMO0002 Reserved for concepts with insufficient information to code with codable children: Secondary | ICD-10-CM | POA: Diagnosis not present

## 2013-02-15 DIAGNOSIS — E1149 Type 2 diabetes mellitus with other diabetic neurological complication: Secondary | ICD-10-CM | POA: Diagnosis not present

## 2013-02-15 DIAGNOSIS — L97509 Non-pressure chronic ulcer of other part of unspecified foot with unspecified severity: Secondary | ICD-10-CM | POA: Diagnosis not present

## 2013-03-09 DIAGNOSIS — I252 Old myocardial infarction: Secondary | ICD-10-CM | POA: Diagnosis not present

## 2013-03-09 DIAGNOSIS — I739 Peripheral vascular disease, unspecified: Secondary | ICD-10-CM | POA: Diagnosis not present

## 2013-03-09 DIAGNOSIS — Z1331 Encounter for screening for depression: Secondary | ICD-10-CM | POA: Diagnosis not present

## 2013-03-09 DIAGNOSIS — I10 Essential (primary) hypertension: Secondary | ICD-10-CM | POA: Diagnosis not present

## 2013-03-09 DIAGNOSIS — L97509 Non-pressure chronic ulcer of other part of unspecified foot with unspecified severity: Secondary | ICD-10-CM | POA: Diagnosis not present

## 2013-03-09 DIAGNOSIS — I2589 Other forms of chronic ischemic heart disease: Secondary | ICD-10-CM | POA: Diagnosis not present

## 2013-03-09 DIAGNOSIS — E1149 Type 2 diabetes mellitus with other diabetic neurological complication: Secondary | ICD-10-CM | POA: Diagnosis not present

## 2013-03-09 DIAGNOSIS — E785 Hyperlipidemia, unspecified: Secondary | ICD-10-CM | POA: Diagnosis not present

## 2013-04-17 ENCOUNTER — Ambulatory Visit: Payer: Medicare Other | Admitting: Cardiology

## 2013-04-24 DIAGNOSIS — L97509 Non-pressure chronic ulcer of other part of unspecified foot with unspecified severity: Secondary | ICD-10-CM | POA: Diagnosis not present

## 2013-04-24 DIAGNOSIS — I83219 Varicose veins of right lower extremity with both ulcer of unspecified site and inflammation: Secondary | ICD-10-CM | POA: Diagnosis not present

## 2013-04-24 DIAGNOSIS — E1149 Type 2 diabetes mellitus with other diabetic neurological complication: Secondary | ICD-10-CM | POA: Diagnosis not present

## 2013-04-24 DIAGNOSIS — L97929 Non-pressure chronic ulcer of unspecified part of left lower leg with unspecified severity: Secondary | ICD-10-CM | POA: Diagnosis not present

## 2013-04-26 ENCOUNTER — Ambulatory Visit (INDEPENDENT_AMBULATORY_CARE_PROVIDER_SITE_OTHER): Payer: Medicare Other | Admitting: Physician Assistant

## 2013-04-26 ENCOUNTER — Encounter: Payer: Self-pay | Admitting: Physician Assistant

## 2013-04-26 VITALS — BP 132/80 | HR 61 | Ht 71.0 in | Wt 310.6 lb

## 2013-04-26 DIAGNOSIS — I255 Ischemic cardiomyopathy: Secondary | ICD-10-CM

## 2013-04-26 DIAGNOSIS — I5022 Chronic systolic (congestive) heart failure: Secondary | ICD-10-CM | POA: Diagnosis not present

## 2013-04-26 DIAGNOSIS — I495 Sick sinus syndrome: Secondary | ICD-10-CM

## 2013-04-26 DIAGNOSIS — I2510 Atherosclerotic heart disease of native coronary artery without angina pectoris: Secondary | ICD-10-CM

## 2013-04-26 DIAGNOSIS — E785 Hyperlipidemia, unspecified: Secondary | ICD-10-CM | POA: Diagnosis not present

## 2013-04-26 DIAGNOSIS — I2589 Other forms of chronic ischemic heart disease: Secondary | ICD-10-CM

## 2013-04-26 DIAGNOSIS — I10 Essential (primary) hypertension: Secondary | ICD-10-CM

## 2013-04-26 NOTE — Progress Notes (Signed)
[address], [place] Kerry Waters, [place]  [address] Phone: [phone] Fax:  [phone]  Date:  04/26/2013   ID:  Kerry Waters, DOB 18-Jul-1945, MRN [id number]  PCP:  [name], MD  Cardiologist:  Dr. Minus Breeding    History of Present Illness: Kerry Waters is a 68 y.o. male with a history of CAD, status post CABG in 1993, carotid stenosis, prior stroke, diabetes mellitus, diabetic neuropathy, HTN, HL, sleep apnea, tachy-brady syndrome, obesity.  Nuclear study in 2008 was low risk without ischemia.  He was admitted to [facility] in 04/2012 with an occipital CVA. Carotid US 04/2012: 40-59% bilateral ICA stenosis. Echocardiogram in 04/2012 demonstrated normal LV function.Follow up Holter monitor in 04/2012 did not demonstrate atrial fibrillation but did demonstrate evidence of tachybradycardia syndrome with episodes of SVT as well as junctional bradycardia. Patient was noted to have chronotropic competence. He was asymptomatic with bradycardia. There were no indications for pacemaker at that time.  Last seen by Dr. Percival Spanish 09/2012.  Patient was admitted in 12/2012 with sepsis syndrome in the setting of right foot ulcer and cellulitis. He was treated with antibiotics and managed by orthopedics.  He was noted to have an elevated troponin (?Type 2 NSTEMI). He was seen by cardiology.  Echocardiogram (01/08/2013): Mild LVH, EF 25-30%, anteroseptal akinesis, inferior HK, anterior HK, grade 1 diastolic dysfunction, aortic sclerosis without stenosis, moderate MAC, mild LAE.  Echo study was difficult to read. Patient exceeded weight limit for MUGA scan.  It was not clear if the patient's EF was really 25%. He had no unstable symptoms and no inpatient workup was planned. Outpatient stress testing was considered.  We have not seen him in the office since d/c.     He is overall doing well.  He denies chest pain, significant dyspnea, syncope, orthopnea, PND.  Foot pain limits his  mobility more than anything.  His LE edema is stable.  His R foot is slowly healing.  He is following with Dr. Sharol Given.    Recent Labs: 01/07/2013: Pro B Natriuretic peptide (BNP) 17379.0*  01/13/2013: ALT 26; Creatinine 1.57*; Hemoglobin 10.3*; Potassium 4.1   Wt Readings from Last 3 Encounters:  04/26/13 310 lb 9.6 oz (140.887 kg)  01/10/13 333 lb 12.4 oz (151.4 kg)  12/25/12 342 lb 3.2 oz (155.221 kg)     Past Medical History  Diagnosis Date  . Joint pain   . Ulcer of foot   . Ischemic heart disease   . MI (myocardial infarction) 1987    ANTERIOR SEPTAL  . Hypokinesis     MILD INFERIOR  . Hypertension   . Edema of lower extremity   . Obesities, morbid   . SVT (supraventricular tachycardia)   . Hx of CABG 1993  . Abnormal cardiovascular stress test 2008    EF 48%. No clear cut ischemia. Low risk scan  . Diabetes mellitus     Dr. Virgina Waters follows.  Kerry Waters PVD (peripheral vascular disease)   . Venous stasis   . Peripheral neuropathy   . Hyperlipidemia   . Coronary artery disease   . Sleep apnea     CPAP  . History of angina   . Cancer     basil cell carcinoma  . Stroke     pt states possible stroke on 04/25/2012    Current Outpatient Prescriptions  Medication Sig Dispense Refill  . aspirin EC 325 MG tablet Take 325 mg by mouth daily.      Kerry Waters  atorvastatin (LIPITOR) 80 MG tablet Take 80 mg by mouth daily.      . Canagliflozin (INVOKANA) 100 MG TABS Take 100 mg by mouth daily.       . carvedilol (COREG) 12.5 MG tablet Take 1 tablet (12.5 mg total) by mouth 2 (two) times daily with a meal.  60 tablet  3  . diltiazem (CARDIZEM CD) 180 MG 24 hr capsule Take 180 mg by mouth 2 (two) times daily.      . furosemide (LASIX) 20 MG tablet Take 1 tablet (20 mg total) by mouth daily.  30 tablet  0  . insulin detemir (LEVEMIR) 100 UNIT/ML injection Inject 45 Units into the skin at bedtime.      Kerry Waters lisinopril (PRINIVIL,ZESTRIL) 40 MG tablet Take 40 mg by mouth daily.      . Multiple Vitamin  (MULTIVITAMIN WITH MINERALS) TABS tablet Take 1 tablet by mouth daily.       No current facility-administered medications for this visit.    Allergies:   Review of patient's allergies indicates no known allergies.   Social History:  The patient  reports that he quit smoking about 28 years ago. His smoking use included Cigarettes. He has a 30 pack-year smoking history. He has never used smokeless tobacco. He reports that he does not drink alcohol or use illicit drugs.   Family History:  The patient's family history includes Cancer in his mother and sister; Diabetes in his son; Heart attack in his father, maternal grandfather, and paternal grandfather; Heart disease in his father.   ROS:  Please see the history of present illness.      All other systems reviewed and negative.   PHYSICAL EXAM: VS:  BP 132/80  Pulse 61  Ht 5\' 11"  (1.803 m)  Wt 310 lb 9.6 oz (140.887 kg)  BMI 43.34 kg/m2 Well nourished, well developed, in no acute distress HEENT: normal Neck: no JVD Cardiac:  normal S1, S2; RRR; no murmur Lungs:  clear to auscultation bilaterally, no wheezing, rhonchi or rales Abd: soft, nontender, no hepatomegaly Ext: trace-1+ bilateral LE edema Skin: warm and dry Neuro:  CNs 2-12 intact, no focal abnormalities noted  EKG:  NSR, HR 61, LAD, PACs, first degree AV block (PR 236 ms), nonspecific ST-T wave changes, no change from prior tracing     ASSESSMENT AND PLAN:  1. CAD:  No angina.  Continue aspirin and statin. Recent non-STEMI was likely secondary to demand ischemia in the setting of sepsis syndrome. I reviewed his case today with Dr. Percival Spanish.  We will arrange a followup echocardiogram to reassess his LV function. I will order this with Definity contrast.  If his EF remains low, consider further ischemic testing. 2. Ischemic Cardiomyopathy:  Continue beta blocker and ACEI. Repeat echocardiogram as noted. If his LVEF is really low, we will need to consider discontinuing  diltiazem. 3. Chronic Systolic CHF:  Volume stable. Continue current therapy. Request recent labs by primary care. 4. Hypertension:  Controlled. 5. Hyperlipidemia:  Management by primary care. 6. Tachy-Brady Syndrome:  Controlled on current therapy. 7. Disposition:  Follow up with Dr. Percival Spanish in 3 months or sooner if needed.  Signed, Richardson Dopp, PA-C  04/26/2013 8:53 AM

## 2013-04-26 NOTE — Patient Instructions (Signed)
Your physician has requested that you have an echocardiogram. PER DR. [place] THIS IS TO BE DONE WITH DEFINITY CONTRAST. Echocardiography is a painless test that uses sound waves to create images of your heart. It provides your doctor with information about the size and shape of your heart and how well your heart's chambers and valves are working. This procedure takes approximately one hour. There are no restrictions for this procedure.   YOU WILL NEED TO FOLLOW UP WITH DR. [place] IN 3 MONTHS  Your physician recommends that you continue on your current medications as directed. Please refer to the Current Medication list given to you today.

## 2013-05-09 DIAGNOSIS — G609 Hereditary and idiopathic neuropathy, unspecified: Secondary | ICD-10-CM | POA: Diagnosis not present

## 2013-05-09 DIAGNOSIS — I252 Old myocardial infarction: Secondary | ICD-10-CM | POA: Diagnosis not present

## 2013-05-09 DIAGNOSIS — E1149 Type 2 diabetes mellitus with other diabetic neurological complication: Secondary | ICD-10-CM | POA: Diagnosis not present

## 2013-05-09 DIAGNOSIS — I739 Peripheral vascular disease, unspecified: Secondary | ICD-10-CM | POA: Diagnosis not present

## 2013-05-09 DIAGNOSIS — N183 Chronic kidney disease, stage 3 unspecified: Secondary | ICD-10-CM | POA: Diagnosis not present

## 2013-05-09 DIAGNOSIS — E1159 Type 2 diabetes mellitus with other circulatory complications: Secondary | ICD-10-CM | POA: Diagnosis not present

## 2013-05-09 DIAGNOSIS — I6990 Unspecified sequelae of unspecified cerebrovascular disease: Secondary | ICD-10-CM | POA: Diagnosis not present

## 2013-05-09 DIAGNOSIS — I2589 Other forms of chronic ischemic heart disease: Secondary | ICD-10-CM | POA: Diagnosis not present

## 2013-05-10 ENCOUNTER — Ambulatory Visit (HOSPITAL_COMMUNITY): Payer: Medicare Other | Attending: Cardiovascular Disease | Admitting: Radiology

## 2013-05-10 ENCOUNTER — Other Ambulatory Visit (HOSPITAL_COMMUNITY): Payer: Medicare Other | Admitting: *Deleted

## 2013-05-10 DIAGNOSIS — Z8673 Personal history of transient ischemic attack (TIA), and cerebral infarction without residual deficits: Secondary | ICD-10-CM | POA: Insufficient documentation

## 2013-05-10 DIAGNOSIS — E1149 Type 2 diabetes mellitus with other diabetic neurological complication: Secondary | ICD-10-CM | POA: Insufficient documentation

## 2013-05-10 DIAGNOSIS — R609 Edema, unspecified: Secondary | ICD-10-CM | POA: Diagnosis not present

## 2013-05-10 DIAGNOSIS — I25810 Atherosclerosis of coronary artery bypass graft(s) without angina pectoris: Secondary | ICD-10-CM

## 2013-05-10 DIAGNOSIS — I6529 Occlusion and stenosis of unspecified carotid artery: Secondary | ICD-10-CM | POA: Insufficient documentation

## 2013-05-10 DIAGNOSIS — I2510 Atherosclerotic heart disease of native coronary artery without angina pectoris: Secondary | ICD-10-CM | POA: Insufficient documentation

## 2013-05-10 DIAGNOSIS — I2589 Other forms of chronic ischemic heart disease: Secondary | ICD-10-CM

## 2013-05-10 DIAGNOSIS — I495 Sick sinus syndrome: Secondary | ICD-10-CM | POA: Insufficient documentation

## 2013-05-10 DIAGNOSIS — E1142 Type 2 diabetes mellitus with diabetic polyneuropathy: Secondary | ICD-10-CM | POA: Diagnosis not present

## 2013-05-10 DIAGNOSIS — I255 Ischemic cardiomyopathy: Secondary | ICD-10-CM

## 2013-05-10 DIAGNOSIS — Z951 Presence of aortocoronary bypass graft: Secondary | ICD-10-CM | POA: Insufficient documentation

## 2013-05-10 DIAGNOSIS — I739 Peripheral vascular disease, unspecified: Secondary | ICD-10-CM | POA: Diagnosis not present

## 2013-05-10 DIAGNOSIS — I5032 Chronic diastolic (congestive) heart failure: Secondary | ICD-10-CM | POA: Insufficient documentation

## 2013-05-10 DIAGNOSIS — G4730 Sleep apnea, unspecified: Secondary | ICD-10-CM | POA: Insufficient documentation

## 2013-05-10 DIAGNOSIS — I5022 Chronic systolic (congestive) heart failure: Secondary | ICD-10-CM

## 2013-05-10 DIAGNOSIS — I10 Essential (primary) hypertension: Secondary | ICD-10-CM | POA: Diagnosis not present

## 2013-05-10 MED ORDER — PERFLUTREN PROTEIN A MICROSPH IV SUSP
1.5000 mL | Freq: Once | INTRAVENOUS | Status: AC
  Administered 2013-05-10: 1.5 mL via INTRAVENOUS

## 2013-05-10 NOTE — Progress Notes (Signed)
Limited Echocardiogram performed with [facility].

## 2013-05-23 DIAGNOSIS — L97929 Non-pressure chronic ulcer of unspecified part of left lower leg with unspecified severity: Secondary | ICD-10-CM | POA: Diagnosis not present

## 2013-05-23 DIAGNOSIS — L97509 Non-pressure chronic ulcer of other part of unspecified foot with unspecified severity: Secondary | ICD-10-CM | POA: Diagnosis not present

## 2013-05-23 DIAGNOSIS — I83219 Varicose veins of right lower extremity with both ulcer of unspecified site and inflammation: Secondary | ICD-10-CM | POA: Diagnosis not present

## 2013-05-23 DIAGNOSIS — E1149 Type 2 diabetes mellitus with other diabetic neurological complication: Secondary | ICD-10-CM | POA: Diagnosis not present

## 2013-05-30 ENCOUNTER — Encounter: Payer: Self-pay | Admitting: Physician Assistant

## 2013-05-30 DIAGNOSIS — L97919 Non-pressure chronic ulcer of unspecified part of right lower leg with unspecified severity: Secondary | ICD-10-CM | POA: Diagnosis not present

## 2013-05-30 DIAGNOSIS — I83219 Varicose veins of right lower extremity with both ulcer of unspecified site and inflammation: Secondary | ICD-10-CM | POA: Diagnosis not present

## 2013-05-30 DIAGNOSIS — E1149 Type 2 diabetes mellitus with other diabetic neurological complication: Secondary | ICD-10-CM | POA: Diagnosis not present

## 2013-05-30 DIAGNOSIS — L97929 Non-pressure chronic ulcer of unspecified part of left lower leg with unspecified severity: Secondary | ICD-10-CM | POA: Diagnosis not present

## 2013-05-30 DIAGNOSIS — L97509 Non-pressure chronic ulcer of other part of unspecified foot with unspecified severity: Secondary | ICD-10-CM | POA: Diagnosis not present

## 2013-06-01 ENCOUNTER — Telehealth: Payer: Self-pay | Admitting: *Deleted

## 2013-06-01 DIAGNOSIS — I10 Essential (primary) hypertension: Secondary | ICD-10-CM

## 2013-06-01 DIAGNOSIS — I2510 Atherosclerotic heart disease of native coronary artery without angina pectoris: Secondary | ICD-10-CM

## 2013-06-01 NOTE — Telephone Encounter (Signed)
pt notified about echo results and EF 35-40%. Due to EF low pt needs to be scheduled for ETT myoview per Dr. Percival Spanish and Brynda Rim. PA. Pt advised scheduling will call and set up appt and I will then cb and go over instructions. pt said ok.

## 2013-06-06 ENCOUNTER — Telehealth: Payer: Self-pay | Admitting: *Deleted

## 2013-06-06 DIAGNOSIS — I10 Essential (primary) hypertension: Secondary | ICD-10-CM

## 2013-06-06 DIAGNOSIS — I2510 Atherosclerotic heart disease of native coronary artery without angina pectoris: Secondary | ICD-10-CM

## 2013-06-06 NOTE — Telephone Encounter (Signed)
pt notified of myoview instructions. Pt states he cannot do the treadmill due to R foot ulcer from DM. I stated we will have to change to a Lexiscan and I will need to d/w [place] W. PA about this change. Pt said ok

## 2013-06-07 NOTE — Telephone Encounter (Signed)
Ok to change to [employer]. Richardson Dopp, PA-C   06/07/2013 7:53 AM

## 2013-06-13 ENCOUNTER — Other Ambulatory Visit (HOSPITAL_COMMUNITY): Payer: Self-pay | Admitting: Orthopedic Surgery

## 2013-06-13 DIAGNOSIS — L97509 Non-pressure chronic ulcer of other part of unspecified foot with unspecified severity: Secondary | ICD-10-CM | POA: Diagnosis not present

## 2013-06-13 DIAGNOSIS — M86179 Other acute osteomyelitis, unspecified ankle and foot: Secondary | ICD-10-CM | POA: Diagnosis not present

## 2013-06-13 DIAGNOSIS — E1149 Type 2 diabetes mellitus with other diabetic neurological complication: Secondary | ICD-10-CM | POA: Diagnosis not present

## 2013-06-15 ENCOUNTER — Encounter (HOSPITAL_COMMUNITY): Payer: Self-pay | Admitting: Pharmacy Technician

## 2013-06-20 ENCOUNTER — Ambulatory Visit (HOSPITAL_BASED_OUTPATIENT_CLINIC_OR_DEPARTMENT_OTHER): Payer: Medicare Other | Admitting: Radiology

## 2013-06-20 ENCOUNTER — Encounter: Payer: Self-pay | Admitting: Internal Medicine

## 2013-06-20 ENCOUNTER — Encounter (HOSPITAL_COMMUNITY): Payer: Self-pay

## 2013-06-20 ENCOUNTER — Encounter (HOSPITAL_COMMUNITY)
Admission: RE | Admit: 2013-06-20 | Discharge: 2013-06-20 | Disposition: A | Discharge status: Home / Self Care | Payer: Medicare Other | Source: Ambulatory Visit | Attending: Orthopedic Surgery | Admitting: Orthopedic Surgery

## 2013-06-20 VITALS — BP 140/90 | HR 72 | Ht 71.0 in | Wt 310.0 lb

## 2013-06-20 DIAGNOSIS — G4730 Sleep apnea, unspecified: Secondary | ICD-10-CM | POA: Diagnosis not present

## 2013-06-20 DIAGNOSIS — Z01812 Encounter for preprocedural laboratory examination: Secondary | ICD-10-CM | POA: Diagnosis not present

## 2013-06-20 DIAGNOSIS — E1165 Type 2 diabetes mellitus with hyperglycemia: Secondary | ICD-10-CM | POA: Diagnosis not present

## 2013-06-20 DIAGNOSIS — I252 Old myocardial infarction: Secondary | ICD-10-CM | POA: Diagnosis not present

## 2013-06-20 DIAGNOSIS — L97509 Non-pressure chronic ulcer of other part of unspecified foot with unspecified severity: Secondary | ICD-10-CM | POA: Diagnosis not present

## 2013-06-20 DIAGNOSIS — Z8673 Personal history of transient ischemic attack (TIA), and cerebral infarction without residual deficits: Secondary | ICD-10-CM | POA: Diagnosis not present

## 2013-06-20 DIAGNOSIS — I739 Peripheral vascular disease, unspecified: Secondary | ICD-10-CM | POA: Diagnosis not present

## 2013-06-20 DIAGNOSIS — Z951 Presence of aortocoronary bypass graft: Secondary | ICD-10-CM | POA: Diagnosis not present

## 2013-06-20 DIAGNOSIS — I2510 Atherosclerotic heart disease of native coronary artery without angina pectoris: Secondary | ICD-10-CM | POA: Diagnosis not present

## 2013-06-20 DIAGNOSIS — IMO0002 Reserved for concepts with insufficient information to code with codable children: Secondary | ICD-10-CM | POA: Diagnosis not present

## 2013-06-20 DIAGNOSIS — M9080 Osteopathy in diseases classified elsewhere, unspecified site: Secondary | ICD-10-CM | POA: Diagnosis not present

## 2013-06-20 DIAGNOSIS — I498 Other specified cardiac arrhythmias: Secondary | ICD-10-CM | POA: Diagnosis not present

## 2013-06-20 DIAGNOSIS — I259 Chronic ischemic heart disease, unspecified: Secondary | ICD-10-CM | POA: Diagnosis not present

## 2013-06-20 DIAGNOSIS — M869 Osteomyelitis, unspecified: Secondary | ICD-10-CM | POA: Diagnosis not present

## 2013-06-20 DIAGNOSIS — Z87891 Personal history of nicotine dependence: Secondary | ICD-10-CM | POA: Diagnosis not present

## 2013-06-20 DIAGNOSIS — E785 Hyperlipidemia, unspecified: Secondary | ICD-10-CM | POA: Diagnosis not present

## 2013-06-20 DIAGNOSIS — I872 Venous insufficiency (chronic) (peripheral): Secondary | ICD-10-CM | POA: Diagnosis not present

## 2013-06-20 DIAGNOSIS — I10 Essential (primary) hypertension: Secondary | ICD-10-CM

## 2013-06-20 DIAGNOSIS — G609 Hereditary and idiopathic neuropathy, unspecified: Secondary | ICD-10-CM | POA: Diagnosis not present

## 2013-06-20 HISTORY — DX: Sepsis, unspecified organism: A41.9

## 2013-06-20 LAB — COMPREHENSIVE METABOLIC PANEL
ALT: 14 U/L (ref 0–53)
AST: 18 U/L (ref 0–37)
Albumin: 3.4 g/dL — ABNORMAL LOW (ref 3.5–5.2)
Alkaline Phosphatase: 100 U/L (ref 39–117)
BUN: 22 mg/dL (ref 6–23)
CHLORIDE: 103 meq/L (ref 96–112)
CO2: 25 mEq/L (ref 19–32)
CREATININE: 0.89 mg/dL (ref 0.50–1.35)
Calcium: 9.6 mg/dL (ref 8.4–10.5)
GFR calc Af Amer: >90 mL/min (ref >90)
GFR calc non Af Amer: 86 mL/min — ABNORMAL LOW (ref >90)
Glucose, Bld: 270 mg/dL — ABNORMAL HIGH (ref 70–99)
Potassium: 5.1 mEq/L (ref 3.7–5.3)
Sodium: 141 mEq/L (ref 137–147)
TOTAL PROTEIN: 7.6 g/dL (ref 6.0–8.3)
Total Bilirubin: 0.4 mg/dL (ref 0.3–1.2)

## 2013-06-20 LAB — CBC
HCT: 39.2 % (ref 39.0–52.0)
Hemoglobin: 14 g/dL (ref 13.0–17.0)
MCH: 32.6 pg (ref 26.0–34.0)
MCHC: 35.7 g/dL (ref 30.0–36.0)
MCV: 91.4 fL (ref 78.0–100.0)
PLATELETS: 149 10*3/uL — AB (ref 150–400)
RBC: 4.29 MIL/uL (ref 4.22–5.81)
RDW: 14.3 % (ref 11.5–15.5)
WBC: 8.2 10*3/uL (ref 4.0–10.5)

## 2013-06-20 LAB — APTT: APTT: 33 s (ref 24–37)

## 2013-06-20 LAB — SURGICAL PCR SCREEN
MRSA, PCR: NEGATIVE
Staphylococcus aureus: NEGATIVE

## 2013-06-20 LAB — PROTIME-INR
INR: 1 (ref 0.00–1.49)
PROTHROMBIN TIME: 13 s (ref 11.6–15.2)

## 2013-06-20 MED ORDER — TECHNETIUM TC 99M SESTAMIBI GENERIC - CARDIOLITE
33.0000 | Freq: Once | INTRAVENOUS | Status: AC | PRN
  Administered 2013-06-20: 33 via INTRAVENOUS

## 2013-06-20 MED ORDER — REGADENOSON 0.4 MG/5ML IV SOLN
0.4000 mg | Freq: Once | INTRAVENOUS | Status: AC
  Administered 2013-06-20: 0.4 mg via INTRAVENOUS

## 2013-06-20 NOTE — Progress Notes (Signed)
[facility] NUCLEAR MED [address] [place],  [address] [phone]    Cardiology Nuclear Med Study  Kerry Waters is a 68 y.o. male     MRN : [id number]     DOB: [birth year]  Procedure Date: 06/20/2013  Nuclear Med Background Indication for Stress Test:  Evaluation for Ischemia, Graft Patency, and Surgical Clearance for (R) Big Toe on 06-22-13 by Dr. Meridee Score History:  CAD, MI 2014, Cath, CABG 1993, SVT (tachy-brady syndrome), Echo 2014 EF 25-30%, MPI 2008 (? low risk - ischemia), EF 48% Cardiac Risk Factors: Carotid Disease, CVA, Family History - CAD, History of Smoking, Hypertension, IDDM, Lipids and PVD  Symptoms:  None indicated   Nuclear Pre-Procedure Caffeine/Decaff Intake:  None > 12 hrs NPO After: 6:30am   Lungs:  clear O2 Sat: 93% on room air. IV 0.9% NS with Angio Cath:  22g  IV Site: R Antecubital x 1, tolerated well IV Started by:  Irven Baltimore, RN  Chest Size (in):  58 Cup Size: n/a  Height: 5\' 11"  (1.803 m)  Weight:  310 lb (140.615 kg)  BMI:  Body mass index is 43.26 kg/(m^2). Tech Comments:  1/2 dose Levemir insulin last night    Nuclear Med Study 1 or 2 day study: 2 day  Stress Test Type:  Lexiscan  Reading MD: N/A  Order Authorizing Provider:  Minus Breeding, MD, and Richardson Dopp, [facility]  Resting Radionuclide: Technetium 63m Sestamibi  Resting Radionuclide Dose: 33.0 mCi   On      06-21-13  Stress Radionuclide:  Technetium 57m Sestamibi  Stress Radionuclide Dose: 33.0 mCi    On        06-20-13          Stress Protocol Rest HR: 72 Stress HR: 85  Rest BP: 140/90 Stress BP: 152/68  Exercise Time (min): n/a METS: n/a           Dose of Adenosine (mg):  n/a Dose of Lexiscan: 0.4 mg  Dose of Atropine (mg): n/a Dose of Dobutamine: n/a mcg/kg/min (at max HR)  Stress Test Technologist: Glade Lloyd, BS-ES  Nuclear Technologist:  Vedia Pereyra, CNMT     Rest Procedure:  Myocardial perfusion imaging was performed at rest 45  minutes following the intravenous administration of Technetium 24m Sestamibi. Rest ECG: NSR - Normal EKG  Stress Procedure:  The patient received IV Lexiscan 0.4 mg over 15-seconds.  Technetium 59m Sestamibi injected at 30-seconds.  Quantitative spect images were obtained after a 45 minute delay.  During the infusion of Lexiscan, the patient complained of SOB.  This resolved in recovery.  Stress ECG: No significant change from baseline ECG  QPS Raw Data Images:  Normal; no motion artifact; normal heart/lung ratio. Stress Images:  Medium-sized, moderate basal to apical inferior perfusion defect.  Small, mild basal to mid anterolateral perfusion defect.  Rest Images:  Medium-sized, moderate basal to mid inferior perfusion defect.  Small, mild basal to mid anterolateral perfusion defect.  Subtraction (SDS):  Primarily fixed defect with some reversibility in the apical inferior wall.  Transient Ischemic Dilatation (Normal <1.22):  1.07 Lung/Heart Ratio (Normal <0.45):  0.50  Quantitative Gated Spect Images QGS EDV:  238 ml QGS ESV:  147 ml  Impression Exercise Capacity:  Lexiscan with no exercise. BP Response:  Normal blood pressure response. Clinical Symptoms:  Dyspnea.  ECG Impression:  No significant ST segment change suggestive of ischemia. Comparison with Prior Nuclear Study: No images to compare  Overall Impression:  Intermediate risk stress nuclear study with a medium-sized, moderate primarily fixed inferior defect (some reversibility in the apical inferior wall) and a fixed, small mild basal to mid anterolateral perfusion defect.  This suggests primarily infarction with some peri-infarct ischemia..  LV Ejection Fraction: 38%.  LV Wall Motion:  Diffuse hypokinesis probably with some regional variation.   Loralie Champagne 06/21/2013

## 2013-06-20 NOTE — Pre-Procedure Instructions (Signed)
CAMBREN HELM  06/20/2013   Your procedure is scheduled on:  Friday, March 6  Report to [facility] Main Entrance "A" at 10:15AM.  Call this number if you have problems the morning of surgery: [phone]   Remember:   Do not eat food or drink liquids after midnight.   Take these medicines the morning of surgery with A SIP OF WATER: carvedilol, dilitazem,    Do not wear jewelry, make-up or nail polish.  Do not wear lotions, powders, or perfumes. You may wear deodorant.  Do not shave 48 hours prior to surgery. Men may shave face and neck.  Do not bring valuables to the hospital.  [facility] is not responsible    for any belongings or valuables.               Contacts, dentures or bridgework may not be worn into surgery.  Leave suitcase in the car. After surgery it may be brought to your room.  For patients admitted to the hospital, discharge time is determined by your                treatment team.               Patients discharged the day of surgery will not be allowed to drive  home.  Name and phone number of your driver: Constance Holster- wife  Special Instructions: Shower using CHG 2 nights before surgery and the night before surgery.  If you shower the day of surgery use CHG.  Use special wash - you have one bottle of CHG for all showers.  You should use approximately 1/3 of the bottle for each shower.   Please read over the following fact sheets that you were given: Pain Booklet, Coughing and Deep Breathing, MRSA Information and Surgical Site Infection Prevention

## 2013-06-20 NOTE — Progress Notes (Signed)
PCP: dr. [name] Cardiologist: Dr.Hochrein Pulmonary: Dr. Keturah Barre  Pt. Stating he is having stress test today with Dr. Jenkins Rouge.Denies chest pain. Last office visit 04/2013 and MD recommended the test.  Last sleep study 4 yrs. Ago at [facility].

## 2013-06-21 ENCOUNTER — Ambulatory Visit (HOSPITAL_BASED_OUTPATIENT_CLINIC_OR_DEPARTMENT_OTHER): Payer: Medicare Other

## 2013-06-21 DIAGNOSIS — R0989 Other specified symptoms and signs involving the circulatory and respiratory systems: Secondary | ICD-10-CM

## 2013-06-21 MED ORDER — CEFAZOLIN SODIUM 10 G IJ SOLR
3.0000 g | INTRAMUSCULAR | Status: AC
  Administered 2013-06-22: 3 g via INTRAVENOUS
  Filled 2013-06-21: qty 3000

## 2013-06-21 MED ORDER — TECHNETIUM TC 99M SESTAMIBI GENERIC - CARDIOLITE
30.0000 | Freq: Once | INTRAVENOUS | Status: AC | PRN
  Administered 2013-06-21: 30 via INTRAVENOUS

## 2013-06-21 NOTE — Progress Notes (Signed)
Anesthesia chart review:  Patient is a 68 year old male scheduled for right great toe amputation at the metatarsophalangeal joint on 06/22/13 by Dr. Sharol Given.   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  BMI is 43 consistent with morbid obesity. PCP is Dr. [name]. Pulmonologist is Dr. Annamaria Boots.  Cardiologist is Dr. Percival Spanish. He was seen most recently by Richardson Dopp, PA-C and set up for a stress test due to persistent LV dysfunction/low EF.  It was a two day nuclear study with second part done this morning.  Nuclear stress test report is still pending.  I talked to [facility] front office staff after 2 PM.  She said staff were already aware of plans for surgery tomorrow so results should be available by then.  I also updated Malachy Mood at Dr. Jess Barters office and anesthesiologist Dr. Conrad [place]. If results are available by 5 PM then anesthesia and Short Stay staff will have to follow-up results in the morning.  Echo on 05/10/13 showed: Left ventricle: The cavity size was normal. Wall thickness was normal. Systolic function was moderately reduced. The estimated ejection fraction was in the range of 35% to 40%.Moderate hypokinesis of the anteroseptal, anterior,anterolateral, and apical myocardium. (EF previously 25-30% in the setting of acute sepsis per 01/08/13 echo with poor acoustic windows.)  EKG on 04/26/13 showed SR with first degree AVB, PACs, non-specific ST abnormality.  By cardiology notes, "Holter monitor in 04/2012 did not demonstrate atrial fibrillation but did demonstrate evidence of tachybradycardia syndrome with episodes of SVT as well as junctional bradycardia. Patient was noted to have  chronotropic competence. He was asymptomatic with bradycardia. There were no indications for pacemaker at that time."  Carotid duplex on 05/04/12 showed 40-59% bilateral ICA stenosis.  CXR on 01/07/13 showed no acute cardiopulmonary process.  Preoperative labs noted.  He will get a fasting CBG on arrival.  Plans to proceed will likely depend on stress test results.  If abnormal, then Dr. Percival Spanish or covering cardiologist can be contacted for additional input.    George Hugh [facility] Short Stay Center/Anesthesiology Phone [phone] 06/21/2013 3:13 PM

## 2013-06-22 ENCOUNTER — Encounter (HOSPITAL_COMMUNITY): Payer: Medicare Other | Admitting: Vascular Surgery

## 2013-06-22 ENCOUNTER — Ambulatory Visit (HOSPITAL_COMMUNITY): Payer: Medicare Other | Admitting: Anesthesiology

## 2013-06-22 ENCOUNTER — Encounter (HOSPITAL_COMMUNITY)
Admission: RE | Disposition: A | Discharge status: Home / Self Care | Payer: Self-pay | Source: Ambulatory Visit | Attending: Orthopedic Surgery

## 2013-06-22 ENCOUNTER — Encounter (HOSPITAL_COMMUNITY): Payer: Self-pay | Admitting: *Deleted

## 2013-06-22 ENCOUNTER — Ambulatory Visit (HOSPITAL_COMMUNITY)
Admission: RE | Admit: 2013-06-22 | Discharge: 2013-06-22 | Disposition: A | Discharge status: Home / Self Care | Payer: Medicare Other | Source: Ambulatory Visit | Attending: Orthopedic Surgery | Admitting: Orthopedic Surgery

## 2013-06-22 DIAGNOSIS — Z01812 Encounter for preprocedural laboratory examination: Secondary | ICD-10-CM | POA: Insufficient documentation

## 2013-06-22 DIAGNOSIS — I252 Old myocardial infarction: Secondary | ICD-10-CM | POA: Insufficient documentation

## 2013-06-22 DIAGNOSIS — G609 Hereditary and idiopathic neuropathy, unspecified: Secondary | ICD-10-CM | POA: Insufficient documentation

## 2013-06-22 DIAGNOSIS — E1169 Type 2 diabetes mellitus with other specified complication: Principal | ICD-10-CM

## 2013-06-22 DIAGNOSIS — E1165 Type 2 diabetes mellitus with hyperglycemia: Secondary | ICD-10-CM | POA: Diagnosis not present

## 2013-06-22 DIAGNOSIS — I2510 Atherosclerotic heart disease of native coronary artery without angina pectoris: Secondary | ICD-10-CM | POA: Insufficient documentation

## 2013-06-22 DIAGNOSIS — G8918 Other acute postprocedural pain: Secondary | ICD-10-CM | POA: Diagnosis not present

## 2013-06-22 DIAGNOSIS — IMO0002 Reserved for concepts with insufficient information to code with codable children: Secondary | ICD-10-CM | POA: Diagnosis not present

## 2013-06-22 DIAGNOSIS — I259 Chronic ischemic heart disease, unspecified: Secondary | ICD-10-CM | POA: Insufficient documentation

## 2013-06-22 DIAGNOSIS — M869 Osteomyelitis, unspecified: Secondary | ICD-10-CM | POA: Insufficient documentation

## 2013-06-22 DIAGNOSIS — I10 Essential (primary) hypertension: Secondary | ICD-10-CM | POA: Insufficient documentation

## 2013-06-22 DIAGNOSIS — M86179 Other acute osteomyelitis, unspecified ankle and foot: Secondary | ICD-10-CM | POA: Diagnosis not present

## 2013-06-22 DIAGNOSIS — I872 Venous insufficiency (chronic) (peripheral): Secondary | ICD-10-CM | POA: Insufficient documentation

## 2013-06-22 DIAGNOSIS — I739 Peripheral vascular disease, unspecified: Secondary | ICD-10-CM | POA: Insufficient documentation

## 2013-06-22 DIAGNOSIS — L97509 Non-pressure chronic ulcer of other part of unspecified foot with unspecified severity: Secondary | ICD-10-CM | POA: Diagnosis not present

## 2013-06-22 DIAGNOSIS — Z8673 Personal history of transient ischemic attack (TIA), and cerebral infarction without residual deficits: Secondary | ICD-10-CM | POA: Insufficient documentation

## 2013-06-22 DIAGNOSIS — I498 Other specified cardiac arrhythmias: Secondary | ICD-10-CM | POA: Insufficient documentation

## 2013-06-22 DIAGNOSIS — Z87891 Personal history of nicotine dependence: Secondary | ICD-10-CM | POA: Insufficient documentation

## 2013-06-22 DIAGNOSIS — M9080 Osteopathy in diseases classified elsewhere, unspecified site: Secondary | ICD-10-CM | POA: Diagnosis not present

## 2013-06-22 DIAGNOSIS — E1149 Type 2 diabetes mellitus with other diabetic neurological complication: Secondary | ICD-10-CM | POA: Diagnosis not present

## 2013-06-22 DIAGNOSIS — Z951 Presence of aortocoronary bypass graft: Secondary | ICD-10-CM | POA: Insufficient documentation

## 2013-06-22 DIAGNOSIS — E785 Hyperlipidemia, unspecified: Secondary | ICD-10-CM | POA: Insufficient documentation

## 2013-06-22 DIAGNOSIS — G4730 Sleep apnea, unspecified: Secondary | ICD-10-CM | POA: Insufficient documentation

## 2013-06-22 HISTORY — PX: AMPUTATION: SHX166

## 2013-06-22 LAB — GLUCOSE, CAPILLARY
Glucose-Capillary: 202 mg/dL — ABNORMAL HIGH (ref 70–99)
Glucose-Capillary: 174 mg/dL — ABNORMAL HIGH (ref 70–99)

## 2013-06-22 SURGERY — AMPUTATION DIGIT
Anesthesia: Monitor Anesthesia Care | Site: Toe | Laterality: Right

## 2013-06-22 MED ORDER — FENTANYL CITRATE 0.05 MG/ML IJ SOLN
INTRAMUSCULAR | Status: AC
  Filled 2013-06-22: qty 5

## 2013-06-22 MED ORDER — HYDROCODONE-ACETAMINOPHEN 5-325 MG PO TABS: 1.0000 | ORAL_TABLET | ORAL | Status: AC | PRN

## 2013-06-22 MED ORDER — FENTANYL CITRATE 0.05 MG/ML IJ SOLN
INTRAMUSCULAR | Status: AC
  Filled 2013-06-22: qty 2

## 2013-06-22 MED ORDER — ONDANSETRON HCL 4 MG/2ML IJ SOLN: 4.0000 mg | Freq: Once | INTRAMUSCULAR | Status: DC | PRN

## 2013-06-22 MED ORDER — LACTATED RINGERS IV SOLN
INTRAVENOUS | Status: DC
  Administered 2013-06-22: 10:00:00 via INTRAVENOUS

## 2013-06-22 MED ORDER — ONDANSETRON HCL 4 MG/2ML IJ SOLN
INTRAMUSCULAR | Status: AC
  Filled 2013-06-22: qty 2

## 2013-06-22 MED ORDER — ONDANSETRON HCL 4 MG PO TABS: 4.0000 mg | ORAL_TABLET | Freq: Four times a day (QID) | ORAL | Status: DC | PRN

## 2013-06-22 MED ORDER — LIDOCAINE HCL (CARDIAC) 20 MG/ML IV SOLN
INTRAVENOUS | Status: AC
  Filled 2013-06-22: qty 5

## 2013-06-22 MED ORDER — MIDAZOLAM HCL 5 MG/5ML IJ SOLN
INTRAMUSCULAR | Status: DC | PRN
  Administered 2013-06-22: 2 mg via INTRAVENOUS

## 2013-06-22 MED ORDER — PROPOFOL 10 MG/ML IV BOLUS
INTRAVENOUS | Status: AC
  Filled 2013-06-22: qty 20

## 2013-06-22 MED ORDER — OXYCODONE HCL 5 MG PO TABS: 5.0000 mg | ORAL_TABLET | Freq: Once | ORAL | Status: DC | PRN

## 2013-06-22 MED ORDER — MIDAZOLAM HCL 2 MG/2ML IJ SOLN
INTRAMUSCULAR | Status: AC
  Filled 2013-06-22: qty 2

## 2013-06-22 MED ORDER — ONDANSETRON HCL 4 MG/2ML IJ SOLN: 4.0000 mg | Freq: Four times a day (QID) | INTRAMUSCULAR | Status: DC | PRN

## 2013-06-22 MED ORDER — MEPERIDINE HCL 25 MG/ML IJ SOLN: 6.2500 mg | INTRAMUSCULAR | Status: DC | PRN

## 2013-06-22 MED ORDER — MIDAZOLAM HCL 2 MG/2ML IJ SOLN
1.0000 mg | INTRAMUSCULAR | Status: DC | PRN
  Administered 2013-06-22: 1 mg via INTRAVENOUS

## 2013-06-22 MED ORDER — POTASSIUM CHLORIDE IN NACL 20-0.45 MEQ/L-% IV SOLN: INTRAVENOUS | Status: DC

## 2013-06-22 MED ORDER — HYDROMORPHONE HCL PF 1 MG/ML IJ SOLN: 0.2500 mg | INTRAMUSCULAR | Status: DC | PRN

## 2013-06-22 MED ORDER — FENTANYL CITRATE 0.05 MG/ML IJ SOLN
INTRAMUSCULAR | Status: DC | PRN
  Administered 2013-06-22: 25 ug via INTRAVENOUS

## 2013-06-22 MED ORDER — OXYCODONE HCL 5 MG/5ML PO SOLN: 5.0000 mg | Freq: Once | ORAL | Status: DC | PRN

## 2013-06-22 MED ORDER — 0.9 % SODIUM CHLORIDE (POUR BTL) OPTIME
TOPICAL | Status: DC | PRN
  Administered 2013-06-22: 1000 mL

## 2013-06-22 MED ORDER — METOCLOPRAMIDE HCL 5 MG/ML IJ SOLN: 5.0000 mg | Freq: Three times a day (TID) | INTRAMUSCULAR | Status: DC | PRN

## 2013-06-22 MED ORDER — METOCLOPRAMIDE HCL 5 MG PO TABS: 5.0000 mg | ORAL_TABLET | Freq: Three times a day (TID) | ORAL | Status: DC | PRN

## 2013-06-22 MED ORDER — MIDAZOLAM HCL 2 MG/2ML IJ SOLN
INTRAMUSCULAR | Status: AC
  Administered 2013-06-22: 1 mg via INTRAVENOUS
  Filled 2013-06-22: qty 2

## 2013-06-22 MED ORDER — BUPIVACAINE HCL (PF) 0.25 % IJ SOLN
INTRAMUSCULAR | Status: DC | PRN
  Administered 2013-06-22: 20 mL

## 2013-06-22 SURGICAL SUPPLY — 32 items
BANDAGE GAUZE ELAST BULKY 4 IN (GAUZE/BANDAGES/DRESSINGS) ×3 IMPLANT
BNDG CMPR 9X4 STRL LF SNTH (GAUZE/BANDAGES/DRESSINGS)
BNDG COHESIVE 4X5 TAN STRL (GAUZE/BANDAGES/DRESSINGS) ×3 IMPLANT
BNDG ESMARK 4X9 LF (GAUZE/BANDAGES/DRESSINGS) IMPLANT
COVER SURGICAL LIGHT HANDLE (MISCELLANEOUS) ×3 IMPLANT
DRAPE U-SHAPE 47X51 STRL (DRAPES) ×3 IMPLANT
DRSG ADAPTIC 3X8 NADH LF (GAUZE/BANDAGES/DRESSINGS) IMPLANT
DRSG EMULSION OIL 3X3 NADH (GAUZE/BANDAGES/DRESSINGS) ×2 IMPLANT
DRSG PAD ABDOMINAL 8X10 ST (GAUZE/BANDAGES/DRESSINGS) ×3 IMPLANT
DURAPREP 26ML APPLICATOR (WOUND CARE) ×3 IMPLANT
ELECT REM PT RETURN 9FT ADLT (ELECTROSURGICAL) ×3
ELECTRODE REM PT RTRN 9FT ADLT (ELECTROSURGICAL) ×1 IMPLANT
GLOVE BIOGEL PI IND STRL 9 (GLOVE) ×1 IMPLANT
GLOVE BIOGEL PI INDICATOR 9 (GLOVE) ×2
GLOVE SURG ORTHO 9.0 STRL STRW (GLOVE) ×3 IMPLANT
GOWN PREVENTION PLUS XLARGE (GOWN DISPOSABLE) ×3 IMPLANT
GOWN SRG XL XLNG 56XLVL 4 (GOWN DISPOSABLE) ×1 IMPLANT
GOWN STRL NON-REIN XL XLG LVL4 (GOWN DISPOSABLE) ×3
KIT BASIN OR (CUSTOM PROCEDURE TRAY) ×3 IMPLANT
KIT ROOM TURNOVER OR (KITS) ×3 IMPLANT
MANIFOLD NEPTUNE II (INSTRUMENTS) ×3 IMPLANT
NEEDLE 22X1 1/2 (OR ONLY) (NEEDLE) IMPLANT
NS IRRIG 1000ML POUR BTL (IV SOLUTION) ×3 IMPLANT
PACK ORTHO EXTREMITY (CUSTOM PROCEDURE TRAY) ×3 IMPLANT
PAD ABD 8X10 STRL (GAUZE/BANDAGES/DRESSINGS) ×2 IMPLANT
PAD ARMBOARD 7.5X6 YLW CONV (MISCELLANEOUS) ×6 IMPLANT
SPONGE GAUZE 4X4 12PLY (GAUZE/BANDAGES/DRESSINGS) ×2 IMPLANT
SUCTION FRAZIER TIP 10 FR DISP (SUCTIONS) IMPLANT
SUT ETHILON 2 0 PSLX (SUTURE) ×3 IMPLANT
SYR CONTROL 10ML LL (SYRINGE) IMPLANT
TOWEL OR 17X24 6PK STRL BLUE (TOWEL DISPOSABLE) ×3 IMPLANT
TOWEL OR 17X26 10 PK STRL BLUE (TOWEL DISPOSABLE) ×3 IMPLANT

## 2013-06-22 NOTE — Discharge Instructions (Signed)
ELEVATE FOOT WHEN AT REST.  KEEP DRESSING DRY AND CLEAN.  TOUCH DOWN WEIGHT BEARING RIGHT FOOT WITH POST OP SHOE AND CRUTCHES.

## 2013-06-22 NOTE — Anesthesia Preprocedure Evaluation (Signed)
Anesthesia Evaluation  Patient identified by MRN, date of birth, ID band Patient awake    Airway Mallampati: II TM Distance: >3 FB Neck ROM: Full    Dental  (+) Dental Advisory Given, Teeth Intact   Pulmonary sleep apnea , former smoker,          Cardiovascular hypertension, Pt. on medications and Pt. on home beta blockers + CAD, + Past MI and + Peripheral Vascular Disease     Neuro/Psych PSYCHIATRIC DISORDERS  Neuromuscular disease CVA    GI/Hepatic   Endo/Other  diabetesMorbid obesity  Renal/GU      Musculoskeletal   Abdominal   Peds  Hematology   Anesthesia Other Findings   Reproductive/Obstetrics                           Anesthesia Physical Anesthesia Plan  ASA: III  Anesthesia Plan: MAC and Regional   Post-op Pain Management:    Induction: Intravenous  Airway Management Planned: Simple Face Mask  Additional Equipment:   Intra-op Plan:   Post-operative Plan:   Informed Consent: I have reviewed the patients History and Physical, chart, labs and discussed the procedure including the risks, benefits and alternatives for the proposed anesthesia with the patient or authorized representative who has indicated his/her understanding and acceptance.   Dental advisory given  Plan Discussed with: Anesthesiologist and Surgeon  Anesthesia Plan Comments:         Anesthesia Quick Evaluation

## 2013-06-22 NOTE — Transfer of Care (Signed)
Immediate Anesthesia Transfer of Care Note  Patient: Kerry Waters  Procedure(s) Performed: Procedure(s) with comments: AMPUTATION DIGIT- right great toe (Right) - Right Great Toe Amputation at MTP Joint  Patient Location: PACU  Anesthesia Type:MAC  Level of Consciousness: awake, alert  and oriented  Airway & Oxygen Therapy: Patient Spontanous Breathing and Patient connected to nasal cannula oxygen  Post-op Assessment: Report given to PACU RN, Post -op Vital signs reviewed and stable and Patient moving all extremities X 4  Post vital signs: Reviewed and stable  Complications: No apparent anesthesia complications

## 2013-06-22 NOTE — Progress Notes (Signed)
Dr. Conrad Selma notified of patient's stress test results. Stated he will assess shortly.

## 2013-06-22 NOTE — H&P (Signed)
Kerry Waters is an 68 y.o. male.   Chief Complaint: Ulceration osteomyelitis right great toe HPI: Patient is a 68 year old gentleman who presents with failure conservative care with ulceration abscess right great toe  Past Medical History  Diagnosis Date  . Joint pain   . Ulcer of foot   . Ischemic heart disease   . MI (myocardial infarction) 1987    ANTERIOR SEPTAL  . Hypokinesis     MILD INFERIOR  . Hypertension   . Edema of lower extremity   . Obesities, morbid   . SVT (supraventricular tachycardia)   . Hx of CABG 1993  . Abnormal cardiovascular stress test 2008    EF 48%. No clear cut ischemia. Low risk scan  . Diabetes mellitus     Dr. [name] follows.  [name] PVD (peripheral vascular disease)   . Venous stasis   . Peripheral neuropathy   . Hyperlipidemia   . Coronary artery disease   . Sleep apnea     CPAP  . History of angina   . Cancer     basil cell carcinoma  . Stroke     pt states possible stroke on 04/25/2012  . Hx of echocardiogram     Echo (04/2013):  EF 35-40%, AS/Ant/AL/apical HK  . Sepsis 12/2012    Past Surgical History  Procedure Laterality Date  . Coronary artery bypass graft  1993    LIMA to LAD with patch angioplasty, SVG to OM, SVG to OM & PD and patch angioplasy to PDA  . Left ankle orif  2000  . Pilonidal cyst excision  30 yrs. ago x2    Family History  Problem Relation Age of Onset  . Heart attack Father   . Heart disease Father     before age 4  . Cancer Mother     stomach cancer  . Heart attack Maternal Grandfather   . Heart attack Paternal Grandfather   . Cancer Sister     breast  . Diabetes Son    Social History:  reports that he quit smoking about 28 years ago. His smoking use included Cigarettes. He has a 30 pack-year smoking history. He has never used smokeless tobacco. He reports that he drinks alcohol. He reports that he does not use illicit drugs.  Allergies: No Known Allergies  No prescriptions prior to admission     Results for orders placed during the hospital encounter of 06/20/13 (from the past 48 hour(s))  SURGICAL PCR SCREEN     Status: None   Collection Time    06/20/13  8:52 AM      Result Value Ref Range   MRSA, PCR NEGATIVE  NEGATIVE   Staphylococcus aureus NEGATIVE  NEGATIVE   Comment:            The Xpert SA Assay (FDA     approved for NASAL specimens     in patients over 68 years of age),     is one component of     a comprehensive surveillance     program.  Test performance has     been validated by [employer] for patients greater     than or equal to 47 year old.     It is not intended     to diagnose infection nor to     guide or monitor treatment.  APTT     Status: None   Collection Time    06/20/13  8:56 AM  Result Value Ref Range   aPTT 33  24 - 37 seconds  CBC     Status: Abnormal   Collection Time    06/20/13  8:56 AM      Result Value Ref Range   WBC 8.2  4.0 - 10.5 K/uL   RBC 4.29  4.22 - 5.81 MIL/uL   Hemoglobin 14.0  13.0 - 17.0 g/dL   HCT 39.2  39.0 - 52.0 %   MCV 91.4  78.0 - 100.0 fL   MCH 32.6  26.0 - 34.0 pg   MCHC 35.7  30.0 - 36.0 g/dL   RDW 14.3  11.5 - 15.5 %   Platelets 149 (*) 150 - 400 K/uL  COMPREHENSIVE METABOLIC PANEL     Status: Abnormal   Collection Time    06/20/13  8:56 AM      Result Value Ref Range   Sodium 141  137 - 147 mEq/L   Potassium 5.1  3.7 - 5.3 mEq/L   Chloride 103  96 - 112 mEq/L   CO2 25  19 - 32 mEq/L   Glucose, Bld 270 (*) 70 - 99 mg/dL   BUN 22  6 - 23 mg/dL   Creatinine, Ser 0.89  0.50 - 1.35 mg/dL   Calcium 9.6  8.4 - 10.5 mg/dL   Total Protein 7.6  6.0 - 8.3 g/dL   Albumin 3.4 (*) 3.5 - 5.2 g/dL   AST 18  0 - 37 U/L   ALT 14  0 - 53 U/L   Alkaline Phosphatase 100  39 - 117 U/L   Total Bilirubin 0.4  0.3 - 1.2 mg/dL   GFR calc non Af Amer 86 (*) >90 mL/min   GFR calc Af Amer >90  >90 mL/min   Comment: (NOTE)     The eGFR has been calculated using the CKD EPI equation.     This calculation has  not been validated in all clinical situations.     eGFR's persistently <90 mL/min signify possible Chronic Kidney     Disease.  PROTIME-INR     Status: None   Collection Time    06/20/13  8:56 AM      Result Value Ref Range   Prothrombin Time 13.0  11.6 - 15.2 seconds   INR 1.00  0.00 - 1.49   No results found.  Review of Systems  All other systems reviewed and are negative.    There were no vitals taken for this visit. Physical Exam  On examination patient has ulceration swelling and exposed bone right great toe Assessment/Plan Assessment: Ulceration abscess osteomyelitis right great toe.  Plan: We'll plan for amputation of right great toe through the MTP joint. Risks and benefits were discussed including persistent infection nonhealing of the wound need for additional surgery. Patient states he understands and wished to proceed at this time.  Jedadiah Abdallah V 06/22/2013, 6:06 AM

## 2013-06-22 NOTE — Anesthesia Procedure Notes (Addendum)
Anesthesia Regional Block:  Ankle block  Pre-Anesthetic Checklist: ,, timeout performed, Correct Patient, Correct Site, Correct Laterality, Correct Procedure,, site marked, risks and benefits discussed, Surgical consent, Pre-op evaluation,  At surgeon's request  Laterality: Right  Prep: chloraprep       Needles:  Injection technique: Single-shot     Needle Length: 3cm  Needle Gauge: 25 and 25 G    Additional Needles: Ankle block Narrative:  Start time: 06/22/2013 11:10 AM End time: 06/22/2013 11:20 AM Injection made incrementally with aspirations every 5 mL.  Performed by: Personally  Anesthesiologist: Lillia Abed MD  Additional Notes: Monitors were applied.  Sterile prep and drape, hand hygiene and sterile gloves were used.  Negative aspiration and test dose prior to incremental administration of local anesthetic using the 25 ga needle. 3 medial ports used.  The patient tolerated the procedure well.    Procedure Name: MAC Date/Time: 06/22/2013 12:23 PM Performed by: Kyung Rudd Pre-anesthesia Checklist: Patient identified, Emergency Drugs available, Suction available, Patient being monitored and Timeout performed Oxygen Delivery Method: Simple face mask Preoxygenation: Pre-oxygenation with 100% oxygen

## 2013-06-22 NOTE — Anesthesia Postprocedure Evaluation (Signed)
Anesthesia Post Note  Patient: Kerry Waters  Procedure(s) Performed: Procedure(s) (LRB): AMPUTATION DIGIT- right great toe (Right)  Anesthesia type: general  Patient location: PACU  Post pain: Pain level controlled  Post assessment: Patient's Cardiovascular Status Stable  Last Vitals:  Filed Vitals:   06/22/13 1253  BP: 146/82  Pulse: 62  Temp: 36.9 C  Resp: 14    Post vital signs: Reviewed and stable  Level of consciousness: sedated  Complications: No apparent anesthesia complications

## 2013-06-22 NOTE — Preoperative (Addendum)
Beta Blockers   Reason not to administer Beta Blockers:Not Applicable, pt took coreg

## 2013-06-22 NOTE — Brief Op Note (Cosign Needed)
06/22/2013  12:46 PM  PATIENT:  Kerry Waters  68 y.o. male  PRE-OPERATIVE DIAGNOSIS:  Osteomyelitis Right Great Toe  POST-OPERATIVE DIAGNOSIS:  Osteomyelitis Right Great Toe  PROCEDURE:  Procedure(s) with comments: AMPUTATION DIGIT- right great toe (Right) - Right Great Toe Amputation at MTP Joint  SURGEON:  Surgeon(s) and Role:    * Newt Minion, MD - Primary  PHYSICIAN ASSISTANT:   ASSISTANTS: none   ANESTHESIA:   general  EBL:     BLOOD ADMINISTERED:none  DRAINS: none   LOCAL MEDICATIONS USED:  NONE  SPECIMEN:  No Specimen  DISPOSITION OF SPECIMEN:  N/A  COUNTS:  YES  TOURNIQUET:  * No tourniquets in log *  DICTATION: .Note written in EPIC  PLAN OF CARE: Discharge to home after PACU  PATIENT DISPOSITION:  PACU - hemodynamically stable.   Delay start of Pharmacological VTE agent (>24hrs) due to surgical blood loss or risk of bleeding: not applicable

## 2013-06-22 NOTE — Progress Notes (Signed)
Orthopedic Tech Progress Note Patient Details:  CASSIAN TORELLI [birth year] [id number] Post op shoe fit to Right foot. Taken off for patient to get dressed. Ortho Devices Type of Ortho Device: Postop shoe/boot Ortho Device/Splint Location: Right Ortho Device/Splint Interventions: Application;Ordered   [place] R Thompson 06/22/2013, 1:51 PM

## 2013-06-22 NOTE — Op Note (Signed)
OPERATIVE REPORT  DATE OF SURGERY: 06/22/2013  PATIENT:  Kerry Waters,  68 y.o. male  PRE-OPERATIVE DIAGNOSIS:  Osteomyelitis Right Great Toe  POST-OPERATIVE DIAGNOSIS:  Osteomyelitis Right Great Toe  PROCEDURE:  Procedure(s): AMPUTATION DIGIT- right great toe  SURGEON:  Surgeon(s): Newt Minion, MD  ANESTHESIA:   regional  EBL:  Minimal ML  SPECIMEN:  No Specimen  TOURNIQUET:  * No tourniquets in log *  PROCEDURE DETAILS: Patient is a 68 year old gentleman with osteomyelitis ulceration right great toe. Patient has failed conservative wound care and presents at this time for surgical intervention. After informed consent with risks including infection neurovascular injury nonhealing of the wound need for additional surgery. Patient underwent a regional anesthetic popliteal block. Patient's right lower extremity was then prepped using DuraPrep draped into a sterile field. A fishmouth incision was made at the MTP joint. The toe was amputated through the MTP joint. Hemostasis was obtained. Patient did have good petechial bleeding. The incision was closed using 2-0 nylon. The wound was covered with Adaptic orthopedic sponges AB dressing Kerlix and Coban. Patient was taken to the PACU in stable condition plan for discharge to home.  PLAN OF CARE: Discharge to home after PACU  PATIENT DISPOSITION:  PACU - hemodynamically stable.   Newt Minion, MD 06/22/2013 8:32 PM

## 2013-06-25 ENCOUNTER — Encounter: Payer: Self-pay | Admitting: Physician Assistant

## 2013-06-26 ENCOUNTER — Other Ambulatory Visit: Payer: Self-pay | Admitting: *Deleted

## 2013-06-26 ENCOUNTER — Encounter (HOSPITAL_COMMUNITY): Payer: Self-pay | Admitting: Orthopedic Surgery

## 2013-06-26 MED ORDER — NITROGLYCERIN 0.4 MG SL SUBL: 0.4000 mg | SUBLINGUAL_TABLET | SUBLINGUAL | Status: AC | PRN

## 2013-07-05 ENCOUNTER — Ambulatory Visit: Payer: Medicare Other | Admitting: Cardiology

## 2013-07-09 DIAGNOSIS — I739 Peripheral vascular disease, unspecified: Secondary | ICD-10-CM | POA: Diagnosis not present

## 2013-07-09 DIAGNOSIS — I6990 Unspecified sequelae of unspecified cerebrovascular disease: Secondary | ICD-10-CM | POA: Diagnosis not present

## 2013-07-09 DIAGNOSIS — I2589 Other forms of chronic ischemic heart disease: Secondary | ICD-10-CM | POA: Diagnosis not present

## 2013-07-09 DIAGNOSIS — E1159 Type 2 diabetes mellitus with other circulatory complications: Secondary | ICD-10-CM | POA: Diagnosis not present

## 2013-07-09 DIAGNOSIS — N183 Chronic kidney disease, stage 3 unspecified: Secondary | ICD-10-CM | POA: Diagnosis not present

## 2013-07-09 DIAGNOSIS — E785 Hyperlipidemia, unspecified: Secondary | ICD-10-CM | POA: Diagnosis not present

## 2013-07-09 DIAGNOSIS — I2510 Atherosclerotic heart disease of native coronary artery without angina pectoris: Secondary | ICD-10-CM | POA: Diagnosis not present

## 2013-07-09 DIAGNOSIS — I252 Old myocardial infarction: Secondary | ICD-10-CM | POA: Diagnosis not present

## 2013-07-11 ENCOUNTER — Encounter: Payer: Self-pay | Admitting: Physician Assistant

## 2013-07-11 ENCOUNTER — Ambulatory Visit (INDEPENDENT_AMBULATORY_CARE_PROVIDER_SITE_OTHER): Payer: Medicare Other | Admitting: Physician Assistant

## 2013-07-11 VITALS — BP 142/68 | HR 68 | Ht 71.0 in | Wt 321.8 lb

## 2013-07-11 DIAGNOSIS — I2589 Other forms of chronic ischemic heart disease: Secondary | ICD-10-CM

## 2013-07-11 DIAGNOSIS — I2510 Atherosclerotic heart disease of native coronary artery without angina pectoris: Secondary | ICD-10-CM | POA: Diagnosis not present

## 2013-07-11 DIAGNOSIS — I5022 Chronic systolic (congestive) heart failure: Secondary | ICD-10-CM

## 2013-07-11 DIAGNOSIS — I10 Essential (primary) hypertension: Secondary | ICD-10-CM | POA: Diagnosis not present

## 2013-07-11 DIAGNOSIS — E785 Hyperlipidemia, unspecified: Secondary | ICD-10-CM

## 2013-07-11 DIAGNOSIS — I255 Ischemic cardiomyopathy: Secondary | ICD-10-CM

## 2013-07-11 MED ORDER — CARVEDILOL 25 MG PO TABS: 25.0000 mg | ORAL_TABLET | Freq: Two times a day (BID) | ORAL | Status: DC

## 2013-07-11 NOTE — Progress Notes (Signed)
[place] [place], [place], [place]  [address] Phone: [phone] Fax:  [phone]  Date:  07/11/2013   ID:  Kerry Waters, DOB 1945-11-30, MRN [id number]  PCP:  [name], MD  Cardiologist:  Dr. Minus Breeding    History of Present Illness: Kerry Waters is a 68 y.o. male with a hx of CAD s/p CABG in 1993, carotid stenosis, prior stroke, diabetes mellitus, diabetic neuropathy, HTN, HL, sleep apnea, tachy-brady syndrome, obesity.  Nuclear study in 2008 was low risk without ischemia.  Echo in 04/2012 demonstrated normal LV function.  He was admitted to [facility] in 04/2012 with an occipital CVA.     Patient was admitted in 12/2012 with sepsis syndrome in the setting of right foot ulcer and cellulitis.  He was noted to have an elevated troponin (?Type 2 NSTEMI) and seen by cardiology.  EF was 25-30% on echo.  Echo study was difficult to read but patient exceeded weight limit for MUGA scan.  It was not clear if the patient's EF was really 25%. He had no unstable symptoms and no inpatient workup was planned. OP stress testing was recommended.    I saw him in follow up 04/26/13. Follow up echocardiogram was arranged to reassess his LV function.  His EF was still low at 35-40%. Follow up nuclear study demonstrated inferior and anterolateral infarct with peri-infarct ischemia, EF 38%. This is felt to be an intermediate risk study. He returns today for follow up.  He recently had his right great toe amputated secondary to osteomyelitis. Patient denies chest pain. He has chronic dyspnea with exertion. He is NYHA class IIb. He denies orthopnea or PND. He has chronic LE edema without significant change. Denies syncope   Recent Labs: 01/07/2013: Pro B Natriuretic peptide (BNP) 17379.0*  06/20/2013: ALT 14; Creatinine 0.89; Hemoglobin 14.0; Potassium 5.1   Wt Readings from Last 3 Encounters:  07/11/13 321 lb 12.8 oz (145.968 kg)  06/20/13 310 lb (140.615 kg)  06/20/13  317 lb 8 oz (144.017 kg)     Past Medical History  Diagnosis Date  . Toe osteomyelitis, right     a. s/p R great toe amputation  . History of MI (myocardial infarction) 1987    ANTERIOR SEPTAL  . Hypertension   . Obesities, morbid   . SVT (supraventricular tachycardia)     a. 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  . Diabetes mellitus     Dr. [name] follows.  [name] PVD (peripheral vascular disease)   . Venous stasis   . Peripheral neuropathy   . Hyperlipidemia   . Sleep apnea     CPAP  . Basal cell carcinoma   . History of stroke     occipital in 04/2012 tx at [facility]  . Sepsis 12/2012  . Coronary artery disease     a. s/p CABG 1993;  b. Nuc Study in 2008 EF 48%, no ischemia;  c.  Nuclear (06/25/13): LexiScan; inferior and anterolateral infarct with peri-infarct ischemia, EF 38%, intermediate risk - Med Rx.  . Ischemic cardiomyopathy     a.  Echo (01/08/2013): Mild LVH, EF 25-30%, AS akinesis, Inf HK, Ant HK, Gr 1 DD, Ao sclerosis, no AS, mod MAC, mild LAE.;   b.  Echo (05/10/13): EF 35-40%, anteroseptal, anterior, anterolateral and apical HK.  . Carotid stenosis     a. Carotid US (  04/2012):  bilateral ICA 40-59%  . NSTEMI (non-ST elevated myocardial infarction)     a. 12/2012 in setting of sepsis syndrome - EF 25-30% on echo; ? Type 2 NSTEMI; b. f/u echo 04/2013 with EF 35-40%; Myoview with inf and AL infarct and peri-infarct ischemia, EF 38% => Med Rx recommended    Current Outpatient Prescriptions  Medication Sig Dispense Refill  . aspirin EC 325 MG tablet Take 325 mg by mouth daily.      [name] atorvastatin (LIPITOR) 80 MG tablet Take 80 mg by mouth daily.      . Canagliflozin (INVOKANA) 100 MG TABS Take 100 mg by mouth daily.       . carvedilol (COREG) 12.5 MG tablet Take 1 tablet (12.5 mg total) by mouth 2 (two) times daily with a meal.  60 tablet  3  . diltiazem  (CARDIZEM CD) 180 MG 24 hr capsule Take 180 mg by mouth 2 (two) times daily.      . furosemide (LASIX) 20 MG tablet Take 20 mg by mouth daily as needed for fluid.      [name] HYDROcodone-acetaminophen (NORCO) 5-325 MG per tablet Take 1-2 tablets by mouth every 4 (four) hours as needed for moderate pain.  40 tablet  0  . insulin detemir (LEVEMIR) 100 UNIT/ML injection Inject 45 Units into the skin at bedtime.      [name] lisinopril (PRINIVIL,ZESTRIL) 20 MG tablet Take 20 mg by mouth daily.      . metFORMIN (GLUCOPHAGE) 500 MG tablet Take 500 mg by mouth 2 (two) times daily with a meal.       . Multiple Vitamin (MULTIVITAMIN WITH MINERALS) TABS tablet Take 1 tablet by mouth daily.      . nitroGLYCERIN (NITROSTAT) 0.4 MG SL tablet Place 1 tablet (0.4 mg total) under the tongue every 5 (five) minutes as needed for chest pain.  25 tablet  11   No current facility-administered medications for this visit.    Allergies:   Review of patient's allergies indicates no known allergies.   Social History:  The patient  reports that he quit smoking about 28 years ago. His smoking use included Cigarettes. He has a 30 pack-year smoking history. He has never used smokeless tobacco. He reports that he drinks alcohol. He reports that he does not use illicit drugs.   Family History:  The patient's family history includes Cancer in his mother and sister; Diabetes in his son; Heart attack in his father, maternal grandfather, and paternal grandfather; Heart disease in his father.   ROS:  Please see the history of present illness.      All other systems reviewed and negative.   PHYSICAL EXAM: VS:  BP 142/68  Pulse 68  Ht 5\' 11"  (1.803 m)  Wt 321 lb 12.8 oz (145.968 kg)  BMI 44.90 kg/m2 Well nourished, well developed, in no acute distress HEENT: normal Neck: I cannot appreciate any JVD Cardiac:  normal S1, S2; RRR; no murmur Lungs:  clear to auscultation bilaterally, no wheezing, rhonchi or rales Abd: soft, nontender, no  hepatomegaly Ext: trace-1+ bilateral LE edema Skin: warm and dry Neuro:  CNs 2-12 intact, no focal abnormalities noted  EKG:  NSR, HR 68, LAD, septal Q waves, first degree AV block (PR 234), nonspecific ST-T wave changes, no change from prior tracing  ASSESSMENT AND PLAN:  1. CAD:  Patient is not having any symptoms consistent with angina. Recent stress test was called intermediate risk. I have reviewed this with Dr.  Hochrein today. In light of the patient's lack of symptoms, he has recommended a trial of medical therapy at this point in time. I reviewed this with patient today. He agrees with this plan. Continue aspirin, statin, beta blocker. 2. Ischemic Cardiomyopathy:  In light of his reduced ejection fraction, I have recommended discontinuing his diltiazem. I will increase his carvedilol to 25 mg twice a day. He will be brought back in close follow up for further medication titration. Once his medications are maximized, we will consider repeat echocardiogram. 3. Chronic Systolic CHF:  Volume stable. Continue current dose of furosemide. 4. Hypertension:  Controlled. 5. Hyperlipidemia:  Management by primary care. 6. Tachy-Brady Syndrome:  Controlled. 7. Disposition:  Follow up with me in 2-3 weeks and Dr. Percival Spanish in 6-8 weeks.  Signed, Richardson Dopp, PA-C  07/11/2013 4:09 PM

## 2013-07-11 NOTE — Patient Instructions (Signed)
STOP DILTIAZEM TODAY  TONIGHT YOU WILL TAKE YOUR REGULAR DOSE OF COREG 12.5 MG  STARTING TOMORROW 07/12/13 YOU WILL INCREASE COREG TO 25 MG TWICE DAILY; NEW RX WAS SENT IN TODAY  Your physician recommends that you schedule a follow-up appointment in: 2-3 [place]  Your physician recommends that you schedule a follow-up appointment in: 6-8 WEEKS WITH DR. HOCHREIN

## 2013-07-30 ENCOUNTER — Ambulatory Visit (INDEPENDENT_AMBULATORY_CARE_PROVIDER_SITE_OTHER): Payer: Medicare Other | Admitting: Physician Assistant

## 2013-07-30 ENCOUNTER — Telehealth: Payer: Self-pay | Admitting: *Deleted

## 2013-07-30 ENCOUNTER — Encounter: Payer: Self-pay | Admitting: Physician Assistant

## 2013-07-30 VITALS — BP 130/78 | HR 69 | Ht 71.0 in | Wt 321.0 lb

## 2013-07-30 DIAGNOSIS — I2510 Atherosclerotic heart disease of native coronary artery without angina pectoris: Secondary | ICD-10-CM

## 2013-07-30 DIAGNOSIS — I2589 Other forms of chronic ischemic heart disease: Secondary | ICD-10-CM

## 2013-07-30 DIAGNOSIS — I10 Essential (primary) hypertension: Secondary | ICD-10-CM | POA: Diagnosis not present

## 2013-07-30 DIAGNOSIS — E785 Hyperlipidemia, unspecified: Secondary | ICD-10-CM

## 2013-07-30 DIAGNOSIS — I5022 Chronic systolic (congestive) heart failure: Secondary | ICD-10-CM

## 2013-07-30 DIAGNOSIS — I255 Ischemic cardiomyopathy: Secondary | ICD-10-CM

## 2013-07-30 MED ORDER — LISINOPRIL 20 MG PO TABS: 20.0000 mg | ORAL_TABLET | Freq: Every day | ORAL | Status: DC

## 2013-07-30 MED ORDER — CARVEDILOL 25 MG PO TABS: 25.0000 mg | ORAL_TABLET | Freq: Two times a day (BID) | ORAL | Status: DC

## 2013-07-30 NOTE — Patient Instructions (Signed)
KEEP FOLLOW UP APP   Your physician recommends that you continue on your current medications as directed. Please refer to the Current Medication list given to you today.

## 2013-07-30 NOTE — Telephone Encounter (Signed)
PATIENT WANTS 90 DAY SUPPLY ALL MEDS

## 2013-07-30 NOTE — Progress Notes (Signed)
[place], [place], [place]  [address] Phone: [phone] Fax:  [phone]  Date:  07/30/2013   ID:  LUISMARIO Waters, DOB Feb 28, 1946, MRN [id number]  PCP:  [name], MD  Cardiologist:  Dr. Minus Breeding    History of Present Illness: Kerry Waters is a 68 y.o. male with a hx of CAD s/p CABG in 1993, carotid stenosis, prior stroke, diabetes mellitus, diabetic neuropathy, HTN, HL, sleep apnea, tachy-brady syndrome, obesity.  Nuclear study in 2008 was low risk without ischemia.  Echo in 04/2012 demonstrated normal LV function.  He was admitted to [facility] in 04/2012 with an occipital CVA.     Admitted in 12/2012 with sepsis syndrome in the setting of right foot ulcer and cellulitis.  He was noted to have an elevated troponin (?Type 2 NSTEMI) and seen by cardiology.  EF was 25-30% on echo.   OP stress testing was recommended.    I saw him in follow up 04/2013 and follow up echocardiogram demonstrated an EF of 35-40%. Follow up nuclear study demonstrated inferior and anterolateral infarct with peri-infarct ischemia, EF 38%. This was felt to be an intermediate risk study.   I saw him 07/11/13.  He had recently had R great toe amputated for osteomyelitis.  Functional symptoms were stable and he had no angina.  We decided to pursue medical Rx.  He is doing well.  He denies chest pain or significant dyspnea.  No orthopnea, PND.  He has chronic pedal edema without changes.  No syncope. No cough.  He has occasional palpitations.  Recent Labs: 01/07/2013: Pro B Natriuretic peptide (BNP) 17379.0*  06/20/2013: ALT 14; Creatinine 0.89; Hemoglobin 14.0; Potassium 5.1   Wt Readings from Last 3 Encounters:  07/30/13 321 lb (145.605 kg)  07/11/13 321 lb 12.8 oz (145.968 kg)  06/20/13 310 lb (140.615 kg)     Past Medical History  Diagnosis Date  . Toe osteomyelitis, right     a. s/p R great toe amputation  . History of MI (myocardial infarction) 1987   ANTERIOR SEPTAL  . Hypertension   . Obesities, morbid   . SVT (supraventricular tachycardia)     a. 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  . Diabetes mellitus     Dr. [name] follows.  [name] PVD (peripheral vascular disease)   . Venous stasis   . Peripheral neuropathy   . Hyperlipidemia   . Sleep apnea     CPAP  . Basal cell carcinoma   . History of stroke     occipital in 04/2012 tx at [facility]  . Sepsis 12/2012  . Coronary artery disease     a. s/p CABG 1993;  b. Nuc Study in 2008 EF 48%, no ischemia;  c.  Nuclear (06/25/13): LexiScan; inferior and anterolateral infarct with peri-infarct ischemia, EF 38%, intermediate risk - Med Rx.  . Ischemic cardiomyopathy     a.  Echo (01/08/2013): Mild LVH, EF 25-30%, AS akinesis, Inf HK, Ant HK, Gr 1 DD, Ao sclerosis, no AS, mod MAC, mild LAE.;   b.  Echo (05/10/13): EF 35-40%, anteroseptal, anterior, anterolateral and apical HK.  . Carotid stenosis     a. Carotid US (04/2012):  bilateral ICA 40-59%  . NSTEMI (non-ST elevated myocardial infarction)     a. 12/2012 in setting of sepsis syndrome - EF 25-30% on echo; ? Type 2  NSTEMI; b. f/u echo 04/2013 with EF 35-40%; Myoview with inf and AL infarct and peri-infarct ischemia, EF 38% => Med Rx recommended    Current Outpatient Prescriptions  Medication Sig Dispense Refill  . aspirin EC 325 MG tablet Take 325 mg by mouth daily.      [name] atorvastatin (LIPITOR) 80 MG tablet Take 80 mg by mouth daily.      . Canagliflozin (INVOKANA) 100 MG TABS Take 100 mg by mouth daily.       . carvedilol (COREG) 25 MG tablet Take 1 tablet (25 mg total) by mouth 2 (two) times daily with a meal.  180 tablet  3  . furosemide (LASIX) 20 MG tablet Take 20 mg by mouth daily as needed for fluid.      [name] HYDROcodone-acetaminophen (NORCO) 5-325 MG per tablet Take 1-2 tablets by mouth every 4 (four) hours as needed for  moderate pain.  40 tablet  0  . insulin detemir (LEVEMIR) 100 UNIT/ML injection Inject 45 Units into the skin at bedtime.      [name] lisinopril (PRINIVIL,ZESTRIL) 20 MG tablet Take 1 tablet (20 mg total) by mouth daily.  90 tablet  3  . metFORMIN (GLUCOPHAGE) 500 MG tablet Take 500 mg by mouth 2 (two) times daily with a meal.       . Multiple Vitamin (MULTIVITAMIN WITH MINERALS) TABS tablet Take 1 tablet by mouth daily.      . nitroGLYCERIN (NITROSTAT) 0.4 MG SL tablet Place 1 tablet (0.4 mg total) under the tongue every 5 (five) minutes as needed for chest pain.  25 tablet  11   No current facility-administered medications for this visit.    Allergies:   Review of patient's allergies indicates no known allergies.   Social History:  The patient  reports that he quit smoking about 28 years ago. His smoking use included Cigarettes. He has a 30 pack-year smoking history. He has never used smokeless tobacco. He reports that he drinks alcohol. He reports that he does not use illicit drugs.   Family History:  The patient's family history includes Cancer in his mother and sister; Diabetes in his son; Heart attack in his father, maternal grandfather, and paternal grandfather; Heart disease in his father.   ROS:  Please see the history of present illness.      All other systems reviewed and negative.   PHYSICAL EXAM: VS:  BP 130/78  Pulse 69  Ht 5\' 11"  (1.803 m)  Wt 321 lb (145.605 kg)  BMI 44.79 kg/m2 Well nourished, well developed, in no acute distress HEENT: normal Neck: I cannot appreciate any JVD Cardiac:  normal S1, S2; RRR; no murmur Lungs:  clear to auscultation bilaterally, no wheezing, rhonchi or rales Abd: soft, nontender, no hepatomegaly Ext: trace-1+ bilateral LE edema Skin: warm and dry Neuro:  CNs 2-12 intact, no focal abnormalities noted  EKG:   NSR, HR 69, septal Q waves, leftward axis, no change from prior tracings.   ASSESSMENT AND PLAN:  1. CAD:  Patient is not having  any symptoms consistent with angina. Recent stress test was called intermediate risk. Continue medical Rx. Continue aspirin, statin, beta blocker. 2. Ischemic Cardiomyopathy:  Continue current dose of beta blocker and ACEI.  He was taken off of Spironolactone in the past.  I suspect this was due to hyperkalemia.   3. Chronic Systolic CHF:  Volume stable. Continue current dose of furosemide. 4. Hypertension:  Controlled. 5. Hyperlipidemia:  Management by primary care. 6. Tachy-Brady  Syndrome:  Controlled. 7. Disposition:  Follow up with Dr. Percival Spanish next month as planned.  Signed, Richardson Dopp, PA-C  07/30/2013 5:02 PM

## 2013-07-31 DIAGNOSIS — E119 Type 2 diabetes mellitus without complications: Secondary | ICD-10-CM | POA: Diagnosis not present

## 2013-07-31 DIAGNOSIS — H53469 Homonymous bilateral field defects, unspecified side: Secondary | ICD-10-CM | POA: Diagnosis not present

## 2013-08-30 ENCOUNTER — Encounter: Payer: Self-pay | Admitting: Cardiology

## 2013-08-30 ENCOUNTER — Ambulatory Visit (INDEPENDENT_AMBULATORY_CARE_PROVIDER_SITE_OTHER): Payer: Medicare Other | Admitting: Cardiology

## 2013-08-30 VITALS — BP 150/80 | HR 84 | Ht 71.0 in | Wt 332.0 lb

## 2013-08-30 DIAGNOSIS — I6529 Occlusion and stenosis of unspecified carotid artery: Secondary | ICD-10-CM | POA: Diagnosis not present

## 2013-08-30 DIAGNOSIS — I2510 Atherosclerotic heart disease of native coronary artery without angina pectoris: Secondary | ICD-10-CM | POA: Diagnosis not present

## 2013-08-30 NOTE — Patient Instructions (Signed)

## 2013-08-30 NOTE — Progress Notes (Signed)
In [facility] The patient presents as a follow up of CAD. He has an ischemic cardiomyopathy.  has had some junctional bradycardia on a Holter after this event. He had some nonsustained pauses. He had some sinus bradycardia. There was occasional ventricular ectopy. He also had supraventricular tachycardia none of which was particularly sustained.  Since I last saw him  He was having some lightheadedness. Dr. [name] reduced his with a low. He still had a couple of episodes for the most part he's not having any severe symptoms. He's not having any syncope or presyncope. He denies any chest pressure, neck or arm discomfort. He denies any shortness of breath, PND or orthopnea. He's had no chest pressure, neck or arm discomfort. Unfortunately he still can ambulate with his right foot in a soft cast. I  Admitted in 12/2012 with sepsis syndrome in the setting of right foot ulcer and cellulitis. He was noted to have an elevated troponin (?Type 2 NSTEMI) and seen by cardiology. EF was 25-30% on echo. OP stress testing was recommended.  I saw him in follow up 04/2013 and follow up echocardiogram demonstrated an EF of 35-40%. Follow up nuclear study demonstrated inferior and anterolateral infarct with peri-infarct ischemia, EF 38%. This was felt to be an intermediate risk study.  I saw him 07/11/13. He had recently had R great toe amputated for osteomyelitis. Functional symptoms were stable and he had no angina. We decided to pursue medical Rx. He is doing well. He denies chest pain or significant dyspnea. No orthopnea, PND. He has chronic pedal edema without changes. No syncope. No cough. He has occasional palpitations.    No Known Allergies  Current Outpatient Prescriptions  Medication Sig Dispense Refill  . aspirin EC 325 MG tablet Take 325 mg by mouth daily.      [name] atorvastatin (LIPITOR) 80 MG tablet Take 80 mg by mouth daily.      . Canagliflozin (INVOKANA) 100 MG TABS Take 100 mg by mouth daily.       .  carvedilol (COREG) 25 MG tablet Take 1 tablet (25 mg total) by mouth 2 (two) times daily with a meal.  180 tablet  3  . furosemide (LASIX) 20 MG tablet Take 20 mg by mouth daily as needed for fluid.      [name] HYDROcodone-acetaminophen (NORCO) 5-325 MG per tablet Take 1-2 tablets by mouth every 4 (four) hours as needed for moderate pain.  40 tablet  0  . insulin detemir (LEVEMIR) 100 UNIT/ML injection Inject 45 Units into the skin at bedtime.      [name] lisinopril (PRINIVIL,ZESTRIL) 20 MG tablet Take 1 tablet (20 mg total) by mouth daily.  90 tablet  3  . metFORMIN (GLUCOPHAGE) 500 MG tablet Take 500 mg by mouth 2 (two) times daily with a meal.       . Multiple Vitamin (MULTIVITAMIN WITH MINERALS) TABS tablet Take 1 tablet by mouth daily.      . nitroGLYCERIN (NITROSTAT) 0.4 MG SL tablet Place 1 tablet (0.4 mg total) under the tongue every 5 (five) minutes as needed for chest pain.  25 tablet  11   No current facility-administered medications for this visit.    Past Medical History  Diagnosis Date  . Toe osteomyelitis, right     a. s/p R great toe amputation  . History of MI (myocardial infarction) 1987    ANTERIOR SEPTAL  . Hypertension   . Obesities, morbid   . SVT (supraventricular tachycardia)  a. 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  . Diabetes mellitus     Dr. [name] follows.  [name] PVD (peripheral vascular disease)   . Venous stasis   . Peripheral neuropathy   . Hyperlipidemia   . Sleep apnea     CPAP  . Basal cell carcinoma   . History of stroke     occipital in 04/2012 tx at [facility]  . Sepsis 12/2012  . Coronary artery disease     a. s/p CABG 1993;  b. Nuc Study in 2008 EF 48%, no ischemia;  c.  Nuclear (06/25/13): LexiScan; inferior and anterolateral infarct with peri-infarct ischemia, EF 38%, intermediate risk - Med Rx.  . Ischemic cardiomyopathy     a.  Echo  (01/08/2013): Mild LVH, EF 25-30%, AS akinesis, Inf HK, Ant HK, Gr 1 DD, Ao sclerosis, no AS, mod MAC, mild LAE.;   b.  Echo (05/10/13): EF 35-40%, anteroseptal, anterior, anterolateral and apical HK.  . Carotid stenosis     a. Carotid US (04/2012):  bilateral ICA 40-59%  . NSTEMI (non-ST elevated myocardial infarction)     a. 12/2012 in setting of sepsis syndrome - EF 25-30% on echo; ? Type 2 NSTEMI; b. f/u echo 04/2013 with EF 35-40%; Myoview with inf and AL infarct and peri-infarct ischemia, EF 38% => Med Rx recommended    Past Surgical History  Procedure Laterality Date  . Coronary artery bypass graft  1993    LIMA to LAD with patch angioplasty, SVG to OM, SVG to OM & PD and patch angioplasy to PDA  . Left ankle orif  2000  . Pilonidal cyst excision  30 yrs. ago x2  . Amputation Right 06/22/2013    Procedure: AMPUTATION DIGIT- right great toe;  Surgeon: Newt Minion, MD;  Location: [facility];  Service: Orthopedics;  Laterality: Right;  Right Great Toe Amputation at [facility] Joint    ROS:  As stated in the HPI and negative for all other systems.  PHYSICAL EXAM BP 150/80  Pulse 84  Ht 5\' 11"  (1.803 m)  Wt 332 lb (150.594 kg)  BMI 46.33 kg/m2 GENERAL:  Well appearing NECK:  No jugular venous distention, waveform within normal limits, carotid upstroke brisk and symmetric, questionable left bruits, no thyromegaly LYMPHATICS:  No cervical, inguinal adenopathy LUNGS:  Clear to auscultation bilaterally CHEST:  Well healed sternotomy scar. HEART:  PMI not displaced or sustained,S1 and S2 within normal limits, no S3, no S4, no clicks, no rubs, apical systolic murmur radiating out the outflow tract ABD:  Flat, positive bowel sounds normal in frequency in pitch, no bruits, no rebound, no guarding, no midline pulsatile mass, no hepatomegaly, no splenomegaly, morbidly obese EXT:  2 plus pulses upper, two plus PT bilateral, no edema, no cyanosis no clubbing, bandaged right great toe NEURO:  Cranial nerves II  through XII grossly intact, motor grossly intact throughout  EKG:  Junctional rhythm with this her atrial contractions, axis within normal limits, early transition, nonspecific T-wave changes  08/30/2013  ASSESSMENT AND PLAN  CVA He remains off Plavix and he is on aspirin only as he had stroke with hemorrhagic conversion. He did have bilateral 40-59% carotid stenosis. He is no longer seeing neurology.  I will schedule follow up carotid Doppler in January.   TACHYBRADY I reviewed a 24-hour monitor to do that I applied. He has tachybradycardia arrhythmia but no syndrome associated.  He is not feeling any tachycardic palpitations. He seems to have appropriate chronotropic response with activity. He's had no presyncope or syncope. There were no sustained pauses. Therefore, there is no class I indication for a pacemaker.  At this point given the junctional rhythm however I will reduce his beta blocker further.  CORONARY HEART DISEASE -  The patient has no new sypmtoms. No further cardiovascular testing is indicated. We will continue with aggressive risk reduction and meds as listed.   HYPERTENSION -  The blood pressure is at target. No change in medications is indicated. We will continue with therapeutic lifestyle changes (TLC).  Obesities, morbid -  The patient understands the need to lose weight with diet and exercise. We have discussed specific strategies for this.

## 2013-09-13 ENCOUNTER — Ambulatory Visit (HOSPITAL_COMMUNITY): Payer: Medicare Other | Attending: Cardiology | Admitting: Cardiology

## 2013-09-13 DIAGNOSIS — I6529 Occlusion and stenosis of unspecified carotid artery: Secondary | ICD-10-CM | POA: Diagnosis not present

## 2013-09-13 NOTE — Progress Notes (Signed)
Carotid duplex complete 

## 2013-10-02 DIAGNOSIS — I872 Venous insufficiency (chronic) (peripheral): Secondary | ICD-10-CM | POA: Diagnosis not present

## 2013-10-02 DIAGNOSIS — E785 Hyperlipidemia, unspecified: Secondary | ICD-10-CM | POA: Diagnosis not present

## 2013-10-02 DIAGNOSIS — Z1331 Encounter for screening for depression: Secondary | ICD-10-CM | POA: Diagnosis not present

## 2013-10-02 DIAGNOSIS — E1159 Type 2 diabetes mellitus with other circulatory complications: Secondary | ICD-10-CM | POA: Diagnosis not present

## 2013-10-02 DIAGNOSIS — I10 Essential (primary) hypertension: Secondary | ICD-10-CM | POA: Diagnosis not present

## 2013-10-02 DIAGNOSIS — E1149 Type 2 diabetes mellitus with other diabetic neurological complication: Secondary | ICD-10-CM | POA: Diagnosis not present

## 2013-10-02 DIAGNOSIS — I6529 Occlusion and stenosis of unspecified carotid artery: Secondary | ICD-10-CM | POA: Diagnosis not present

## 2013-12-14 DIAGNOSIS — I2510 Atherosclerotic heart disease of native coronary artery without angina pectoris: Secondary | ICD-10-CM | POA: Diagnosis not present

## 2013-12-14 DIAGNOSIS — I6529 Occlusion and stenosis of unspecified carotid artery: Secondary | ICD-10-CM | POA: Diagnosis not present

## 2013-12-14 DIAGNOSIS — I2589 Other forms of chronic ischemic heart disease: Secondary | ICD-10-CM | POA: Diagnosis not present

## 2013-12-14 DIAGNOSIS — I10 Essential (primary) hypertension: Secondary | ICD-10-CM | POA: Diagnosis not present

## 2013-12-14 DIAGNOSIS — I6990 Unspecified sequelae of unspecified cerebrovascular disease: Secondary | ICD-10-CM | POA: Diagnosis not present

## 2013-12-14 DIAGNOSIS — E1159 Type 2 diabetes mellitus with other circulatory complications: Secondary | ICD-10-CM | POA: Diagnosis not present

## 2013-12-14 DIAGNOSIS — N183 Chronic kidney disease, stage 3 unspecified: Secondary | ICD-10-CM | POA: Diagnosis not present

## 2013-12-27 ENCOUNTER — Ambulatory Visit: Payer: Medicare Other | Admitting: Internal Medicine

## 2013-12-27 ENCOUNTER — Encounter: Payer: Self-pay | Admitting: Internal Medicine

## 2013-12-27 VITALS — BP 134/80 | HR 68 | Ht 71.0 in | Wt 322.2 lb

## 2013-12-27 DIAGNOSIS — G4733 Obstructive sleep apnea (adult) (pediatric): Secondary | ICD-10-CM

## 2013-12-27 NOTE — Patient Instructions (Addendum)
We can continue CPAP [place] Patient   Order DME [facility] download CPAP  pressure compliance  Dx OSA  Fluy vax  Please call as needed

## 2013-12-27 NOTE — Progress Notes (Signed)
Subjective:    Patient ID: Kerry Waters, male    DOB: Mar 01, 1946, 68 y.o.   MRN: [id number]  HPI 12/24/10- 68 year old male former smoker followed for obstructive sleep apnea complicated by obesity, HBP CAD/MI/ hx SVT. Last here May 08, 2010  Wearing right boot for diabetic ulcer/ getting hyperbaric treatments.  He continues CPAP all night every night 19 cwp/ [facility]. Compliance and effectiveness are great, with no complaints from home that he is snoring.  Denies lung concerns. Followed now by Dr Percival Spanish for cardiology.   12/24/11- 68 year old male former smoker followed for obstructive sleep apnea complicated by obesity, HBP CAD/MI/ hx SVT. Wears CPAP19/ [facility] every night for approximately 8 hours; pressure doing well for patient. Rhinitis-mild nasal congestion and some wheeze in the last week. Does not feel sick. He wants to wait on flu vaccine due to this.  12/25/12- 68 year old male former smoker followed for obstructive sleep apnea complicated by obesity, HBP CAD/MI/ hx SVT. FOLLOWS [id number] CPAP 19/ [facility] every night for 6-8 hours; pressure working well for patient. No acute concerns this visit. We discussed comfort and alternatives to CPAP  12/27/13- 68 year old male former smoker followed for obstructive sleep apnea complicated by obesity, HBP CAD/MI/ hx SVT. FOLLOWS FOR:  wears his CPAP 19/ [facility] every night for about 6-8 hours.  feels that his pressure is doing well.    Review of Systems- HPI Constitutional:   No-   weight loss, night sweats, fevers, chills, fatigue, lassitude. HEENT:   No-  headaches, difficulty swallowing, tooth/dental problems, sore throat,       No-  sneezing, itching, ear ache, nasal congestion, post nasal drip,  CV:  No-   chest pain, orthopnea, PND, swelling in lower extremities, anasarca, dizziness, palpitations Resp: No-   shortness of breath with exertion or at rest.              No-   productive cough,  No  non-productive cough,  No-  coughing up of blood.              No-   change in color of mucus.  No- wheezing.   Skin: No-   rash or lesions. GI:  No-   heartburn, indigestion, abdominal pain, nausea, vomiting,  GU:  MS:  No-   joint pain or swelling.  + Diabetic foot ulcers Neuro- Psych:  No- change in mood or affect. No depression or anxiety.  No memory loss. Objective:   Physical Exam General- Alert, Oriented, Affect-appropriate, Distress- none acute   Very obese Skin- rash-none, lesions- none, excoriation- none Lymphadenopathy- none Head- atraumatic            Eyes- Gross vision intact, PERRLA, conjunctivae clear secretions            Ears- Hearing, canals - normal             Nose- + pale mucosa, No- Septal dev, mucus, polyps, erosion, perforation             Throat- Mallampati IV , mucosa clear , drainage- none, tonsils- atrophic  Tongue coated Neck- flexible , trachea midline, no stridor , thyroid nl, carotid no bruit Chest - symmetrical excursion , unlabored           Heart/CV- RRR , no murmur , no gallop  , no rub, nl s1 s2                           -  JVD- none , edema- 2+/ support hose, stasis changes- none, varices- none           Lung- clear to P&A, wheeze- none, cough- none , dullness-none, rub- none           Chest wall-  Abd-  Br/ Gen/ Rectal- Not done, not indicated Extrem- cyanosis- none, clubbing, none, atrophy- none, strength- nl   Neuro- grossly intact to observation    Assessment & Plan:

## 2014-01-08 ENCOUNTER — Telehealth: Payer: Self-pay | Admitting: Internal Medicine

## 2014-01-08 DIAGNOSIS — E1149 Type 2 diabetes mellitus with other diabetic neurological complication: Secondary | ICD-10-CM | POA: Diagnosis not present

## 2014-01-08 NOTE — Telephone Encounter (Signed)
lmtcb x1 

## 2014-01-08 NOTE — Telephone Encounter (Signed)
I have placed message and results on Kerry Waters's cart to review and advise if any changes need to be made.

## 2014-01-08 NOTE — Telephone Encounter (Signed)
CPAP download shows very good control and usage at pressure of 19. We do not need to change settings.

## 2014-01-09 NOTE — Telephone Encounter (Signed)
lmtcb x2 

## 2014-01-10 NOTE — Telephone Encounter (Signed)
Called, spoke with pt.  Informed him of below results and recs per Dr. Joya Gaskins.  He verbalized understanding and voiced no further questions or concerns at this time.

## 2014-01-25 DIAGNOSIS — E1151 Type 2 diabetes mellitus with diabetic peripheral angiopathy without gangrene: Secondary | ICD-10-CM | POA: Diagnosis not present

## 2014-01-25 DIAGNOSIS — I10 Essential (primary) hypertension: Secondary | ICD-10-CM | POA: Diagnosis not present

## 2014-01-25 DIAGNOSIS — Z6841 Body Mass Index (BMI) 40.0 and over, adult: Secondary | ICD-10-CM | POA: Diagnosis not present

## 2014-03-04 IMAGING — CR DG TOE GREAT 2+V*R*
3 series · 3 of 3 positions shown · non-contrast
Comparison: 06/02/2010.

CLINICAL DATA: Soft tissue sore on the medial side of the right
first toe.

RIGHT GREAT TOE

[t toes ap right]
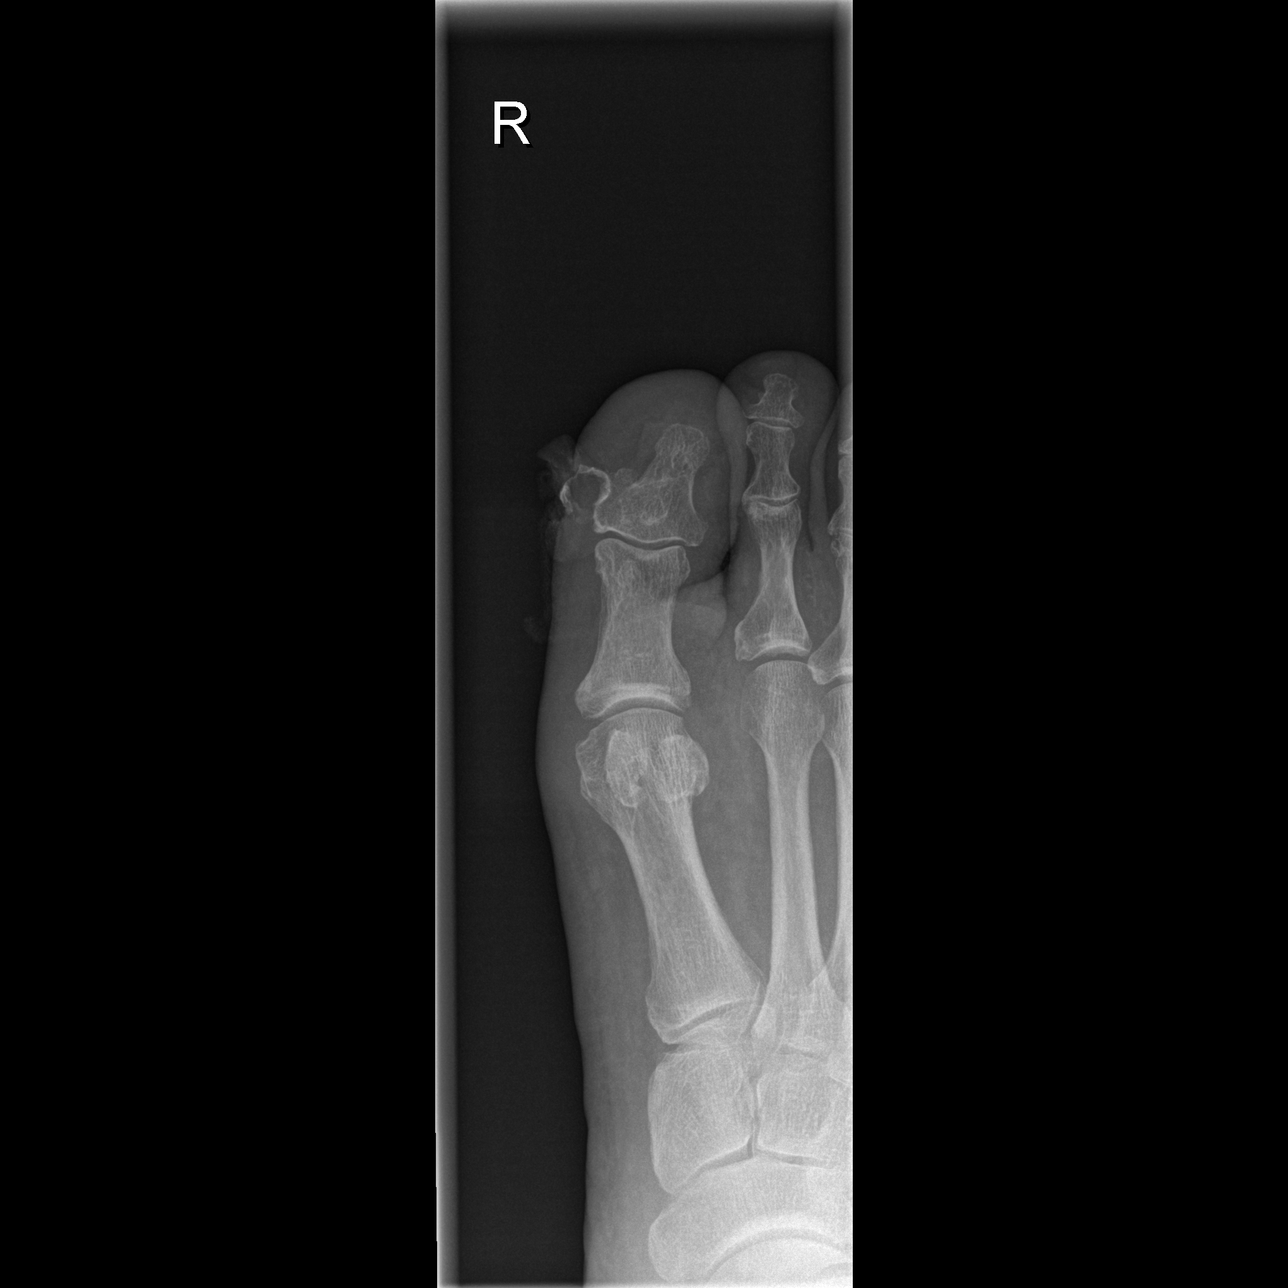

[t toes oblique right]
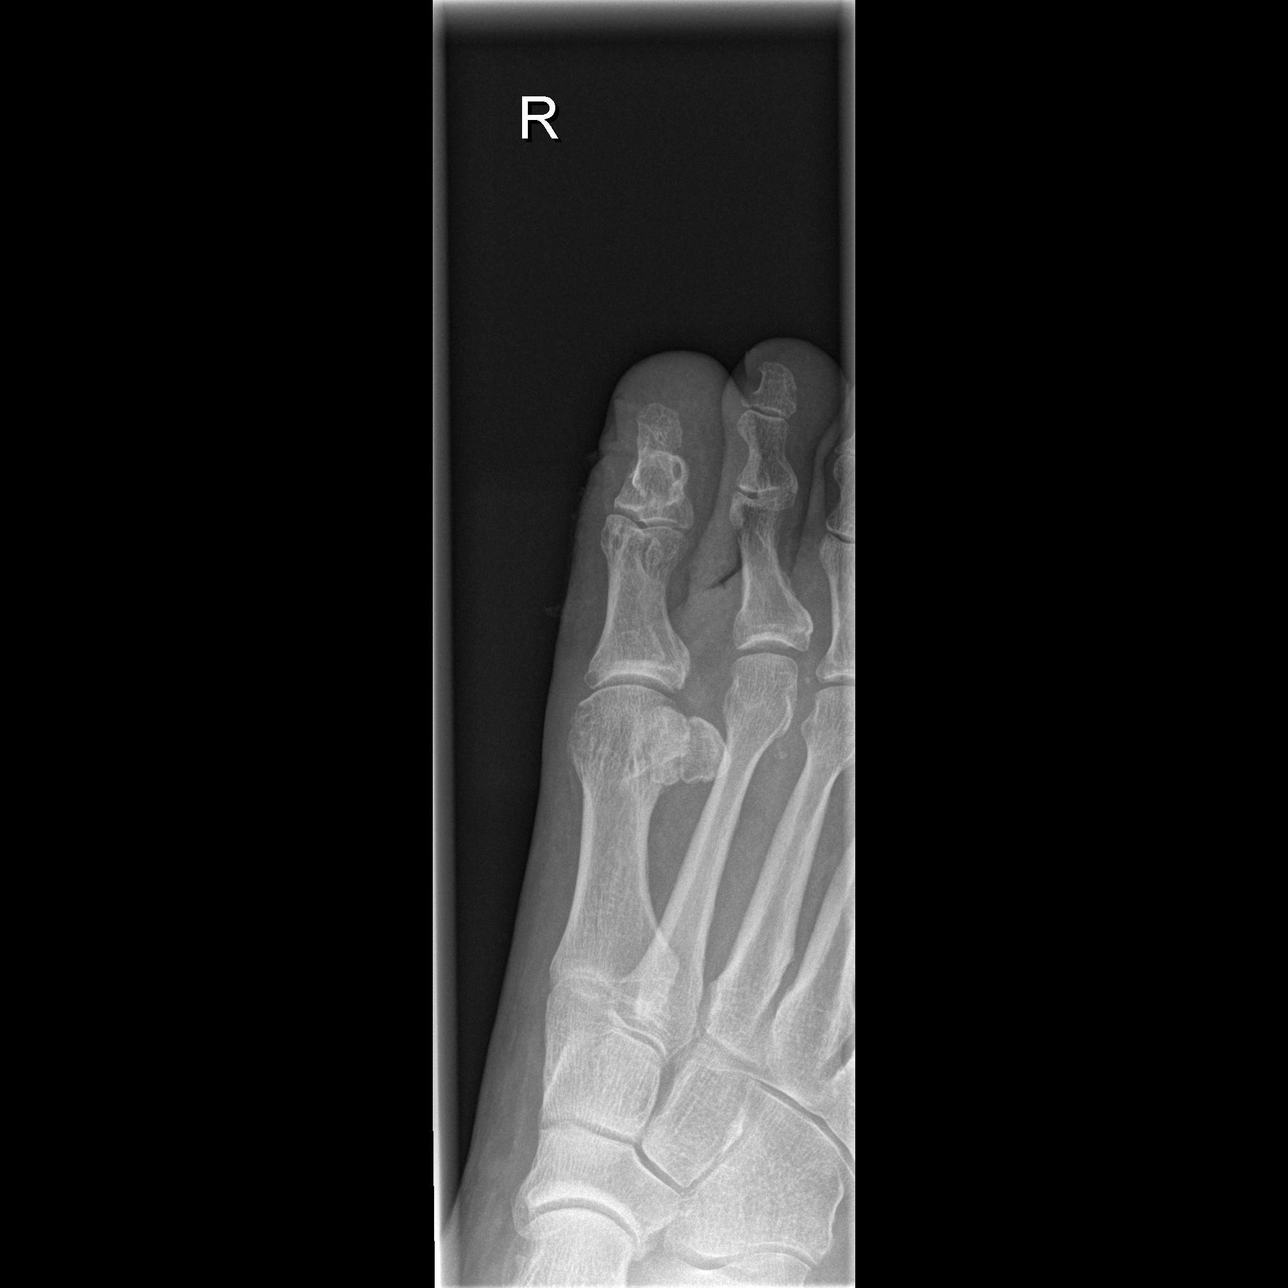

[t toes lateral right]
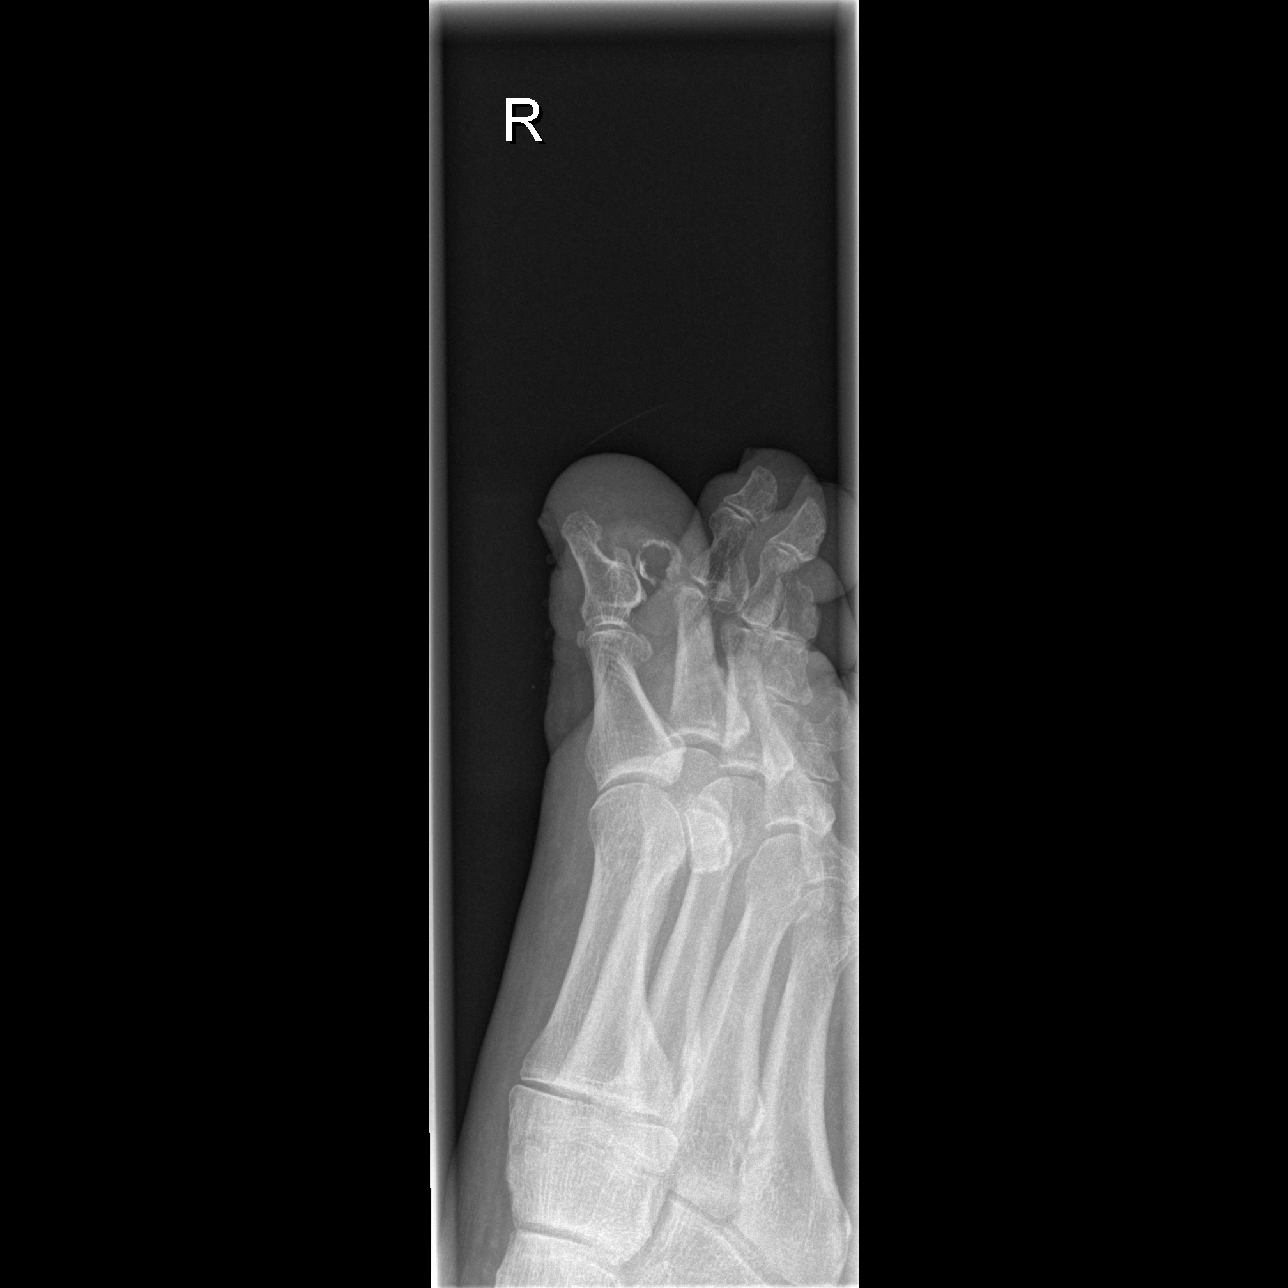

[3 of 3 positions shown; findings below may reference images not displayed]

FINDINGS: This there is a soft tissue abnormality again seen with
dressing material and opaque density which may reflect medication
or dressing.  No fracture or bony destruction is seen.  No abnormal
periosteal reaction is evident. Chronic degenerative subcortical
cyst formation involving the proximal medial aspect of proximal
phalanx of right first toe and the distal medial metaphysis area of
the first metatarsal..
IMPRESSION: Evidence of the soft tissue sore. No bony destruction.  No plain
imaging evidence of osteomyelitis at this time.

## 2014-03-29 DIAGNOSIS — E785 Hyperlipidemia, unspecified: Secondary | ICD-10-CM | POA: Diagnosis not present

## 2014-03-29 DIAGNOSIS — E1151 Type 2 diabetes mellitus with diabetic peripheral angiopathy without gangrene: Secondary | ICD-10-CM | POA: Diagnosis not present

## 2014-03-29 DIAGNOSIS — E1140 Type 2 diabetes mellitus with diabetic neuropathy, unspecified: Secondary | ICD-10-CM | POA: Diagnosis not present

## 2014-03-29 DIAGNOSIS — I10 Essential (primary) hypertension: Secondary | ICD-10-CM | POA: Diagnosis not present

## 2014-03-29 DIAGNOSIS — I2510 Atherosclerotic heart disease of native coronary artery without angina pectoris: Secondary | ICD-10-CM | POA: Diagnosis not present

## 2014-03-29 DIAGNOSIS — N183 Chronic kidney disease, stage 3 (moderate): Secondary | ICD-10-CM | POA: Diagnosis not present

## 2014-03-29 DIAGNOSIS — I6529 Occlusion and stenosis of unspecified carotid artery: Secondary | ICD-10-CM | POA: Diagnosis not present

## 2014-05-30 DIAGNOSIS — N183 Chronic kidney disease, stage 3 (moderate): Secondary | ICD-10-CM | POA: Diagnosis not present

## 2014-05-30 DIAGNOSIS — I10 Essential (primary) hypertension: Secondary | ICD-10-CM | POA: Diagnosis not present

## 2014-05-30 DIAGNOSIS — E1140 Type 2 diabetes mellitus with diabetic neuropathy, unspecified: Secondary | ICD-10-CM | POA: Diagnosis not present

## 2014-05-30 DIAGNOSIS — I6529 Occlusion and stenosis of unspecified carotid artery: Secondary | ICD-10-CM | POA: Diagnosis not present

## 2014-05-30 DIAGNOSIS — I2510 Atherosclerotic heart disease of native coronary artery without angina pectoris: Secondary | ICD-10-CM | POA: Diagnosis not present

## 2014-05-30 DIAGNOSIS — E1151 Type 2 diabetes mellitus with diabetic peripheral angiopathy without gangrene: Secondary | ICD-10-CM | POA: Diagnosis not present

## 2014-05-30 DIAGNOSIS — Z1389 Encounter for screening for other disorder: Secondary | ICD-10-CM | POA: Diagnosis not present

## 2014-05-30 DIAGNOSIS — Z6841 Body Mass Index (BMI) 40.0 and over, adult: Secondary | ICD-10-CM | POA: Diagnosis not present

## 2014-07-30 DIAGNOSIS — I6529 Occlusion and stenosis of unspecified carotid artery: Secondary | ICD-10-CM | POA: Diagnosis not present

## 2014-07-30 DIAGNOSIS — N183 Chronic kidney disease, stage 3 (moderate): Secondary | ICD-10-CM | POA: Diagnosis not present

## 2014-07-30 DIAGNOSIS — E785 Hyperlipidemia, unspecified: Secondary | ICD-10-CM | POA: Diagnosis not present

## 2014-07-30 DIAGNOSIS — G629 Polyneuropathy, unspecified: Secondary | ICD-10-CM | POA: Diagnosis not present

## 2014-07-30 DIAGNOSIS — Z6841 Body Mass Index (BMI) 40.0 and over, adult: Secondary | ICD-10-CM | POA: Diagnosis not present

## 2014-07-30 DIAGNOSIS — I10 Essential (primary) hypertension: Secondary | ICD-10-CM | POA: Diagnosis not present

## 2014-07-30 DIAGNOSIS — E1140 Type 2 diabetes mellitus with diabetic neuropathy, unspecified: Secondary | ICD-10-CM | POA: Diagnosis not present

## 2014-07-31 ENCOUNTER — Other Ambulatory Visit: Payer: Self-pay | Admitting: Internal Medicine

## 2014-07-31 DIAGNOSIS — I739 Peripheral vascular disease, unspecified: Principal | ICD-10-CM

## 2014-07-31 DIAGNOSIS — I779 Disorder of arteries and arterioles, unspecified: Secondary | ICD-10-CM

## 2014-08-20 DIAGNOSIS — H53462 Homonymous bilateral field defects, left side: Secondary | ICD-10-CM | POA: Diagnosis not present

## 2014-08-20 DIAGNOSIS — E119 Type 2 diabetes mellitus without complications: Secondary | ICD-10-CM | POA: Diagnosis not present

## 2014-08-21 ENCOUNTER — Ambulatory Visit
Admission: RE | Admit: 2014-08-21 | Discharge: 2014-08-21 | Disposition: A | Discharge status: Home / Self Care | Payer: Medicare Other | Source: Ambulatory Visit | Attending: Internal Medicine | Admitting: Internal Medicine

## 2014-08-21 DIAGNOSIS — I6523 Occlusion and stenosis of bilateral carotid arteries: Secondary | ICD-10-CM | POA: Diagnosis not present

## 2014-08-21 DIAGNOSIS — I779 Disorder of arteries and arterioles, unspecified: Secondary | ICD-10-CM

## 2014-08-21 DIAGNOSIS — I739 Peripheral vascular disease, unspecified: Principal | ICD-10-CM

## 2014-09-09 ENCOUNTER — Encounter: Payer: Self-pay | Admitting: Cardiology

## 2014-09-09 ENCOUNTER — Ambulatory Visit (INDEPENDENT_AMBULATORY_CARE_PROVIDER_SITE_OTHER): Payer: Medicare Other | Admitting: Cardiology

## 2014-09-09 VITALS — BP 134/66 | HR 72 | Ht 71.0 in | Wt 324.0 lb

## 2014-09-09 DIAGNOSIS — I255 Ischemic cardiomyopathy: Secondary | ICD-10-CM | POA: Diagnosis not present

## 2014-09-09 NOTE — Progress Notes (Signed)
In [facility] The patient presents as a follow up of CAD. He has an ischemic cardiomyopathy.  His last ejection fraction was about 35%. This was actually slightly higher than previous. He's had a follow-up stress perfusion study with inferolateral inferolateral infarct with some mild peri-infarct ischemia. He was managed medically. He has had stroke. He was initially on aspirin and Plavix but had some peri-infarct hemorrhagic conversion with this and so is on aspirin only. He is limited in his activities because of joint pain and his weight.  He does do some limited walking and fishes.  With this he denies any new cardiovascular symptoms. He has no new shortness of breath, PND or orthopnea. He has no chest pressure, neck or arm discomfort. He hasn't required any nitroglycerin.   No Known Allergies  Current Outpatient Prescriptions  Medication Sig Dispense Refill  . aspirin EC 325 MG tablet Take 325 mg by mouth daily.    [name] atorvastatin (LIPITOR) 80 MG tablet Take 80 mg by mouth daily.    . Canagliflozin (INVOKANA) 100 MG TABS Take 100 mg by mouth daily.     . carvedilol (COREG) 25 MG tablet Take 1 tablet (25 mg total) by mouth 2 (two) times daily with a meal. 180 tablet 3  . furosemide (LASIX) 20 MG tablet Take 20 mg by mouth daily as needed for fluid.    [name] HYDROcodone-acetaminophen (NORCO) 5-325 MG per tablet Take 1-2 tablets by mouth every 4 (four) hours as needed for moderate pain. 40 tablet 0  . insulin detemir (LEVEMIR) 100 UNIT/ML injection Inject 55 Units into the skin at bedtime.     [name] lisinopril (PRINIVIL,ZESTRIL) 20 MG tablet Take 1 tablet (20 mg total) by mouth daily. 90 tablet 3  . metFORMIN (GLUCOPHAGE) 500 MG tablet Take 500 mg by mouth 2 (two) times daily with a meal.     . Multiple Vitamin (MULTIVITAMIN WITH MINERALS) TABS tablet Take 1 tablet by mouth daily.    . nitroGLYCERIN (NITROSTAT) 0.4 MG SL tablet Place 1 tablet (0.4 mg total) under the tongue every 5 (five) minutes as needed  for chest pain. 25 tablet 11   No current facility-administered medications for this visit.    Past Medical History  Diagnosis Date  . Toe osteomyelitis, right     a. s/p R great toe amputation  . History of MI (myocardial infarction) 1987    ANTERIOR SEPTAL  . Hypertension   . Obesities, morbid   . SVT (supraventricular tachycardia)     a. 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  . Diabetes mellitus     Dr. [name] follows.  [name] PVD (peripheral vascular disease)   . Venous stasis   . Peripheral neuropathy   . Hyperlipidemia   . Sleep apnea     CPAP  . Basal cell carcinoma   . History of stroke     occipital in 04/2012 tx at [facility]  . Sepsis 12/2012  . Coronary artery disease     a. s/p CABG 1993;  b  Lexiscan 2015  . Ischemic cardiomyopathy     a.  Echo (01/08/2013): Mild LVH, EF 25-30%, AS akinesis, Inf HK, Ant HK, Gr 1 DD, Ao sclerosis, no AS, mod MAC, mild LAE.;   b.  Echo (05/10/13): EF 35-40%, anteroseptal, anterior, anterolateral and apical HK.  . Carotid stenosis     a. Carotid US (04/2012):  bilateral ICA 40-59%  . NSTEMI (non-ST elevated myocardial infarction)     a. 12/2012 in setting of sepsis syndrome - EF 25-30% on echo; ? Type 2 NSTEMI; b. f/u echo 04/2013 with EF 35-40%; Myoview with inf and AL infarct and peri-infarct ischemia, EF 38% => Med Rx recommended    Past Surgical History  Procedure Laterality Date  . Coronary artery bypass graft  1993    LIMA to LAD with patch angioplasty, SVG to OM, SVG to OM & PD and patch angioplasy to PDA  . Left ankle orif  2000  . Pilonidal cyst excision  30 yrs. ago x2  . Amputation Right 06/22/2013    Procedure: AMPUTATION DIGIT- right great toe;  Surgeon: Newt Minion, MD;  Location: [facility];  Service: Orthopedics;  Laterality: Right;  Right Great Toe Amputation at [facility]    ROS:  Mild loss of vision.   Otherwise  stated in the HPI and negative for all other systems.  PHYSICAL EXAM BP 134/66 mmHg  Pulse 72  Ht 5\' 11"  (1.803 m)  Wt 324 lb (146.965 kg)  BMI 45.21 kg/m2 GENERAL:  Well appearing NECK:  No jugular venous distention, waveform within normal limits, carotid upstroke brisk and symmetric, questionable left bruits, no thyromegaly LYMPHATICS:  No cervical, inguinal adenopathy LUNGS:  Clear to auscultation bilaterally CHEST:  Well healed sternotomy scar. HEART:  PMI not displaced or sustained,S1 and S2 within normal limits, no S3, no S4, no clicks, no rubs, apical systolic murmur radiating out the outflow tract ABD:  Flat, positive bowel sounds normal in frequency in pitch, no bruits, no rebound, no guarding, no midline pulsatile mass, no hepatomegaly, no splenomegaly, morbidly obese EXT:  2 plus pulses upper, two plus PT bilateral, no edema, no cyanosis no clubbing, bandaged right great toe NEURO:  Cranial nerves II through XII grossly intact, motor grossly intact throughout  EKG:  Normal sinus rhythm, rate 59, left axis deviation, nonspecific T-wave flattening, no acute ST-T wave changes.  09/09/2014  ASSESSMENT AND PLAN  CVA He remains off Plavix and he is on aspirin only as he had stroke with hemorrhagic conversion. He did have bilateral 40-59% carotid stenosis. He had this test done recently I reviewed this with him. He has an appointment for follow-up with Dr. Kellie Simmering.   TACHYBRADY He has had bradycardia in particular on a Holter but has no symptoms related to this. No change in therapy is indicated. There is no indication for pacemaker.  CORONARY HEART DISEASE -  The patient has no new sypmtoms since his most recent stress test. No further cardiovascular testing is indicated. We will continue with aggressive risk reduction and meds as listed.   HYPERTENSION -  The blood pressure is at target. No change in medications is indicated. We will continue with therapeutic lifestyle  changes (TLC).  Obesities, morbid -  He lost about 13 pounds recently although his weight goes up and down. I encouraged continued weight loss. He says his hemoglobin A1c is 8. He understands that he is at high risk for future cardiovascular events without controlling his weight and his diabetes.

## 2014-09-09 NOTE — Patient Instructions (Signed)
Your physician recommends that you schedule a follow-up appointment in: one year with Dr. Hochrein  

## 2014-09-17 ENCOUNTER — Other Ambulatory Visit: Payer: Self-pay | Admitting: Physician Assistant

## 2014-09-27 ENCOUNTER — Other Ambulatory Visit: Payer: Self-pay | Admitting: Physician Assistant

## 2014-10-03 DIAGNOSIS — I2510 Atherosclerotic heart disease of native coronary artery without angina pectoris: Secondary | ICD-10-CM | POA: Diagnosis not present

## 2014-10-03 DIAGNOSIS — G4700 Insomnia, unspecified: Secondary | ICD-10-CM | POA: Diagnosis not present

## 2014-10-03 DIAGNOSIS — I10 Essential (primary) hypertension: Secondary | ICD-10-CM | POA: Diagnosis not present

## 2014-10-03 DIAGNOSIS — I255 Ischemic cardiomyopathy: Secondary | ICD-10-CM | POA: Diagnosis not present

## 2014-10-03 DIAGNOSIS — E1151 Type 2 diabetes mellitus with diabetic peripheral angiopathy without gangrene: Secondary | ICD-10-CM | POA: Diagnosis not present

## 2014-10-03 DIAGNOSIS — I130 Hypertensive heart and chronic kidney disease with heart failure and stage 1 through stage 4 chronic kidney disease, or unspecified chronic kidney disease: Secondary | ICD-10-CM | POA: Diagnosis not present

## 2014-10-03 DIAGNOSIS — M6530 Trigger finger, unspecified finger: Secondary | ICD-10-CM | POA: Diagnosis not present

## 2014-10-03 DIAGNOSIS — N183 Chronic kidney disease, stage 3 (moderate): Secondary | ICD-10-CM | POA: Diagnosis not present

## 2014-10-03 DIAGNOSIS — I6529 Occlusion and stenosis of unspecified carotid artery: Secondary | ICD-10-CM | POA: Diagnosis not present

## 2014-10-03 DIAGNOSIS — Z6841 Body Mass Index (BMI) 40.0 and over, adult: Secondary | ICD-10-CM | POA: Diagnosis not present

## 2014-10-03 DIAGNOSIS — G629 Polyneuropathy, unspecified: Secondary | ICD-10-CM | POA: Diagnosis not present

## 2014-10-03 DIAGNOSIS — E1140 Type 2 diabetes mellitus with diabetic neuropathy, unspecified: Secondary | ICD-10-CM | POA: Diagnosis not present

## 2014-10-11 ENCOUNTER — Encounter: Payer: Self-pay | Admitting: Vascular Surgery

## 2014-10-14 ENCOUNTER — Other Ambulatory Visit: Payer: Self-pay

## 2014-10-15 ENCOUNTER — Ambulatory Visit (INDEPENDENT_AMBULATORY_CARE_PROVIDER_SITE_OTHER): Payer: Medicare Other | Admitting: Vascular Surgery

## 2014-10-15 ENCOUNTER — Encounter: Payer: Self-pay | Admitting: Vascular Surgery

## 2014-10-15 VITALS — BP 132/75 | HR 70 | Resp 16 | Ht 71.0 in | Wt 325.0 lb

## 2014-10-15 DIAGNOSIS — I255 Ischemic cardiomyopathy: Secondary | ICD-10-CM | POA: Diagnosis not present

## 2014-10-15 DIAGNOSIS — I6523 Occlusion and stenosis of bilateral carotid arteries: Secondary | ICD-10-CM

## 2014-10-15 NOTE — Progress Notes (Signed)
Subjective:     Patient ID: Kerry Waters, male   DOB: March 13, 1946, 69 y.o.   MRN: [id number]  HPI this 69 year old male was referred today for evaluation of carotid occlusive disease. He is known to me having previously undergone laser ablation procedures in his saphenous veins for severe venous insufficiency with edema and skin changes. Apparently he had a stroke in January 2014 which affected his vision somewhat and states that he also had some tingling in some fingers of the left hand. Otherwise he is relatively free of any neurologic deficits. Recently had a carotid ultrasound performed at [facility] imaging which was interpreted as having 50-69% narrowing on the left and less than 50% on the right ICA. He denies any lateralizing weakness, aphasia, amaurosis fugax, diplopia, blurred vision, or syncope.  Past Medical History  Diagnosis Date  . Toe osteomyelitis, right     a. s/p R great toe amputation  . History of MI (myocardial infarction) 1987    ANTERIOR SEPTAL  . Hypertension   . Obesities, morbid   . SVT (supraventricular tachycardia)     a. 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  . Diabetes mellitus     Dr. [name] follows.  [name] PVD (peripheral vascular disease)   . Venous stasis   . Peripheral neuropathy   . Hyperlipidemia   . Sleep apnea     CPAP  . Basal cell carcinoma   . History of stroke     occipital in 04/2012 tx at [facility]  . Sepsis 12/2012  . Coronary artery disease     a. s/p CABG 1993;  b  Lexiscan 2015  . Ischemic cardiomyopathy     a.  Echo (01/08/2013): Mild LVH, EF 25-30%, AS akinesis, Inf HK, Ant HK, Gr 1 DD, Ao sclerosis, no AS, mod MAC, mild LAE.;   b.  Echo (05/10/13): EF 35-40%, anteroseptal, anterior, anterolateral and apical HK.  . Carotid stenosis     a. Carotid US (04/2012):  bilateral ICA 40-59%  . NSTEMI (non-ST elevated myocardial  infarction)     a. 12/2012 in setting of sepsis syndrome - EF 25-30% on echo; ? Type 2 NSTEMI; b. f/u echo 04/2013 with EF 35-40%; Myoview with inf and AL infarct and peri-infarct ischemia, EF 38% => Med Rx recommended    History  Substance Use Topics  . Smoking status: Former Smoker -- 1.00 packs/day for 30 years    Types: Cigarettes    Quit date: 04/19/1985  . Smokeless tobacco: Never Used  . Alcohol Use: 0.0 oz/week    0 Standard drinks or equivalent per week     Comment: 3x a week    Family History  Problem Relation Age of Onset  . Heart attack Father   . Heart disease Father     before age 51  . Cancer Mother     stomach cancer  . Heart attack Maternal Grandfather   . Heart attack Paternal Grandfather   . Cancer Sister     breast  . Diabetes Son     No Known Allergies   Current outpatient prescriptions:  .  aspirin EC 325 MG tablet, Take 325 mg by mouth daily., Disp: , Rfl:  .  atorvastatin (LIPITOR) 80 MG tablet, Take 80 mg by mouth daily., Disp: , Rfl:  .  Canagliflozin (INVOKANA) 100 MG TABS, Take 100 mg by mouth daily. , Disp: ,  Rfl:  .  carvedilol (COREG) 25 MG tablet, TAKE ONE TABLET BY MOUTH TWICE DAILY WITH MEALS, Disp: 180 tablet, Rfl: 1 .  furosemide (LASIX) 20 MG tablet, Take 20 mg by mouth daily as needed for fluid., Disp: , Rfl:  .  HYDROcodone-acetaminophen (NORCO) 5-325 MG per tablet, Take 1-2 tablets by mouth every 4 (four) hours as needed for moderate pain., Disp: 40 tablet, Rfl: 0 .  insulin NPH-regular Human (NOVOLIN 70/30) (70-30) 100 UNIT/ML injection, Inject 40 Units into the skin daily with breakfast. 40 units every morning after breakfast and 37 units after dinner or evening meal. Per patient, Disp: , Rfl:  .  lisinopril (PRINIVIL,ZESTRIL) 20 MG tablet, TAKE ONE TABLET BY MOUTH ONCE DAILY, Disp: 90 tablet, Rfl: 3 .  metFORMIN (GLUCOPHAGE) 500 MG tablet, Take 500 mg by mouth 2 (two) times daily with a meal. , Disp: , Rfl:  .  Multiple Vitamin  (MULTIVITAMIN WITH MINERALS) TABS tablet, Take 1 tablet by mouth daily., Disp: , Rfl:  .  nitroGLYCERIN (NITROSTAT) 0.4 MG SL tablet, Place 1 tablet (0.4 mg total) under the tongue every 5 (five) minutes as needed for chest pain., Disp: 25 tablet, Rfl: 11 .  insulin detemir (LEVEMIR) 100 UNIT/ML injection, Inject 55 Units into the skin at bedtime. , Disp: , Rfl:   Filed Vitals:   10/15/14 1443 10/15/14 1448  BP: 136/72 132/75  Pulse: 71 70  Resp: 16   Height: 5\' 11"  (1.803 m)   Weight: 325 lb (147.419 kg)     Body mass index is 45.35 kg/(m^2).         Review of Systems patient has morbid obesity. Denies chest pain   does have dyspnea on exertion. Complains of left knee pain and chronic lower extremity edema with history of stasis ulcer right leg. Other systems negative and complete review of systems Objective:   Physical Exam BP 132/75 mmHg  Pulse 70  Resp 16  Ht 5\' 11"  (1.803 m)  Wt 325 lb (147.419 kg)  BMI 45.35 kg/m2  Gen.-alert and oriented x3 in no apparent distress-morbidly obese HEENT normal for age Lungs no rhonchi or wheezing Cardiovascular regular rhythm no murmurs carotid pulses 3+ palpable no bruits audible Abdomen soft nontender no palpable masses Musculoskeletal free of  major deformities-obese Skin clear -no rashes Neurologic normal Lower extremities 3+ femoral pulses bilaterally. Chronic 1+ edema bilaterally with hyperpigmentation lower third both legs without active ulceration.  Today I reviewed the carotid ultrasound report from [facility] and looked at the velocities. I would interpret this as no more than 50% stenosis in the left ICA and less than 50% stenosis in the right ICA but this was not performed in our laboratory.       Assessment:     Bilateral mild carotid occlusive disease with remote history of CVA-no active symptoms now Morbid obesity History coronary artery disease with coronary artery bypass grafting in the early  1990s Osteoarthritis with left knee pain Chronic venous insufficiency status post bilateral laser ablation great saphenous veins    Plan:     Patient return to see me in 1 year with carotid duplex exam in our office to compare to previous studies. He develops any new neurologic symptoms in the interim he will be in touch with me Continue daily aspirin

## 2014-10-16 NOTE — Addendum Note (Signed)
Addended by: Dorthula Rue L on: 10/16/2014 11:47 AM   Modules accepted: Orders

## 2014-12-09 DIAGNOSIS — E1165 Type 2 diabetes mellitus with hyperglycemia: Secondary | ICD-10-CM | POA: Diagnosis not present

## 2014-12-09 DIAGNOSIS — M6530 Trigger finger, unspecified finger: Secondary | ICD-10-CM | POA: Diagnosis not present

## 2014-12-09 DIAGNOSIS — E1151 Type 2 diabetes mellitus with diabetic peripheral angiopathy without gangrene: Secondary | ICD-10-CM | POA: Diagnosis not present

## 2014-12-09 DIAGNOSIS — I130 Hypertensive heart and chronic kidney disease with heart failure and stage 1 through stage 4 chronic kidney disease, or unspecified chronic kidney disease: Secondary | ICD-10-CM | POA: Diagnosis not present

## 2014-12-09 DIAGNOSIS — Z6841 Body Mass Index (BMI) 40.0 and over, adult: Secondary | ICD-10-CM | POA: Diagnosis not present

## 2014-12-09 DIAGNOSIS — Z794 Long term (current) use of insulin: Secondary | ICD-10-CM | POA: Diagnosis not present

## 2014-12-09 DIAGNOSIS — N183 Chronic kidney disease, stage 3 (moderate): Secondary | ICD-10-CM | POA: Diagnosis not present

## 2014-12-09 DIAGNOSIS — I495 Sick sinus syndrome: Secondary | ICD-10-CM | POA: Diagnosis not present

## 2014-12-09 DIAGNOSIS — I6529 Occlusion and stenosis of unspecified carotid artery: Secondary | ICD-10-CM | POA: Diagnosis not present

## 2014-12-09 DIAGNOSIS — E1140 Type 2 diabetes mellitus with diabetic neuropathy, unspecified: Secondary | ICD-10-CM | POA: Diagnosis not present

## 2015-01-02 ENCOUNTER — Ambulatory Visit: Payer: Medicare Other | Admitting: Internal Medicine

## 2015-01-09 DIAGNOSIS — M65351 Trigger finger, right little finger: Secondary | ICD-10-CM | POA: Diagnosis not present

## 2015-01-16 IMAGING — CR DG PELVIS 1-2V
1 series · 1 of 1 positions shown · non-contrast
Comparison: None.

CLINICAL DATA: Joint pain status post fall.

EXAM:
PELVIS - 1-2 VIEW

[t pelvis a.p.]
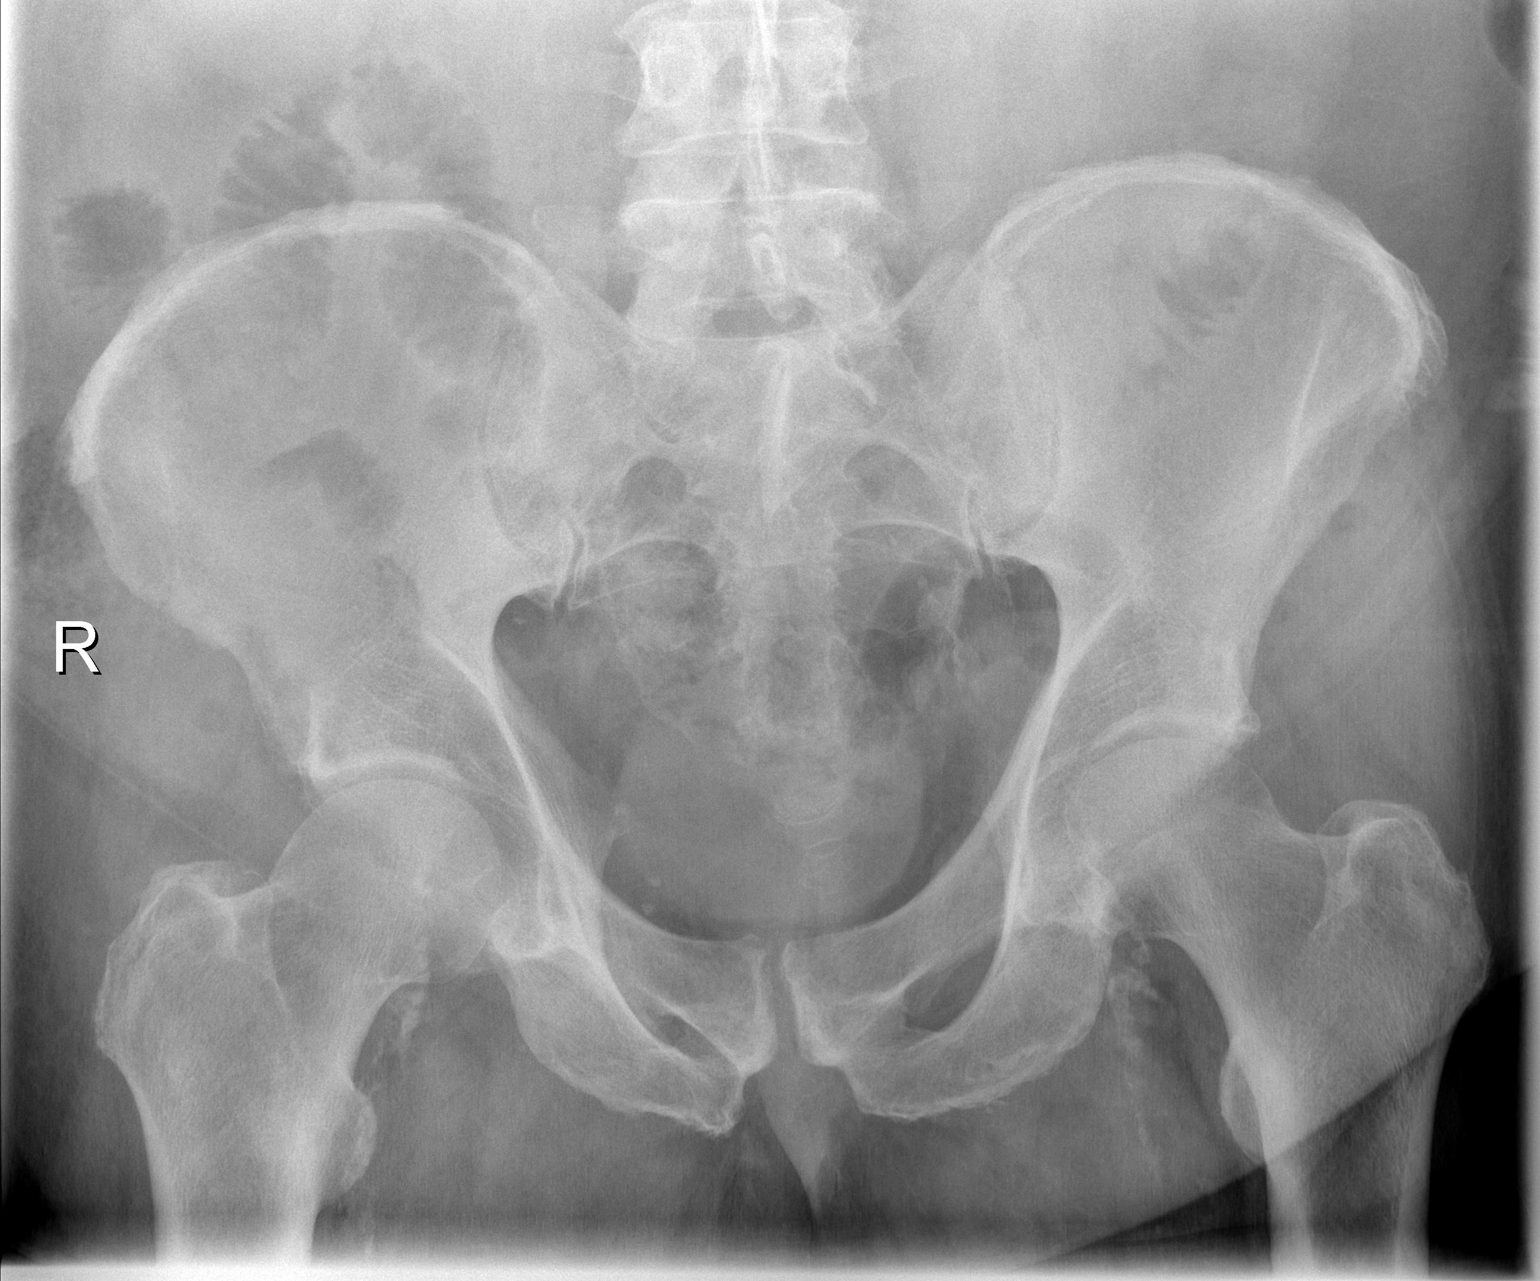

[1 of 1 positions shown; findings below may reference images not displayed]

FINDINGS: The mineralization and alignment are normal. There is no evidence of
acute fracture or dislocation. The hip joint spaces are preserved.
Scattered vascular calcifications are noted.
IMPRESSION: No acute osseous findings.

## 2015-01-16 IMAGING — CT CT HEAD W/O CM
2 series · 16 of 30 positions shown, 20 images · non-contrast
Comparison: May 23, 2012

CLINICAL DATA: Confusion; recent trauma; fever

EXAM:
CT HEAD WITHOUT CONTRAST
TECHNIQUE: Contiguous axial images were obtained from the base of the skull
through the vertex without intravenous contrast. Study was obtained
within 24 hr of patient's arrival at the emergency department.

[Series 2: head w/o · axial · non-contrast · 0.49mm/px · z∈[+63,+203]mm · 13 of 34 slices shown, 17 images]
[im 3/34  brain]
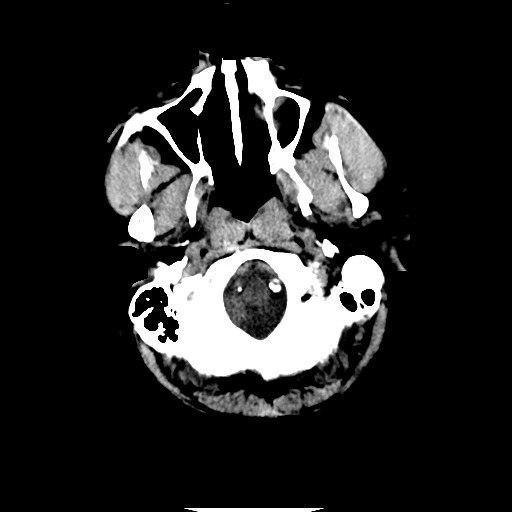
[im 3/34  bone]
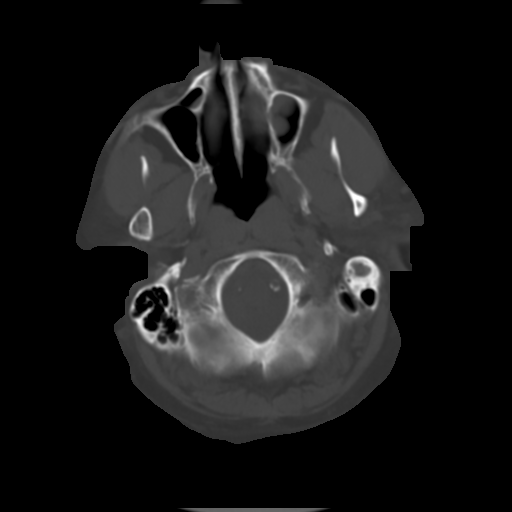
[im 5/34  brain]
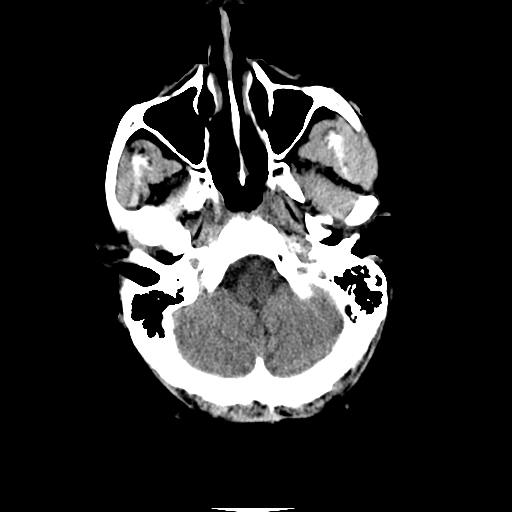
[im 8/34  brain]
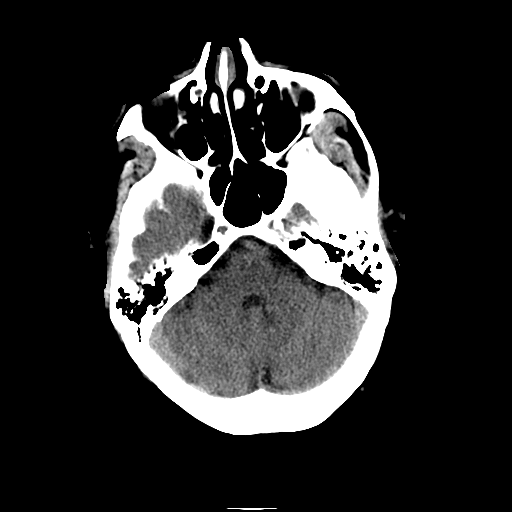
[im 10/34  brain]
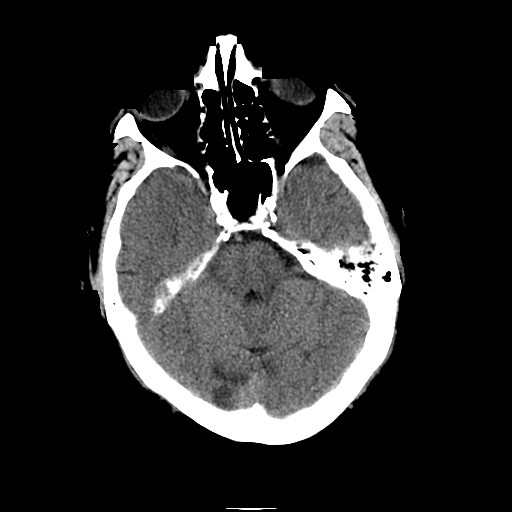
[im 12/34  brain]
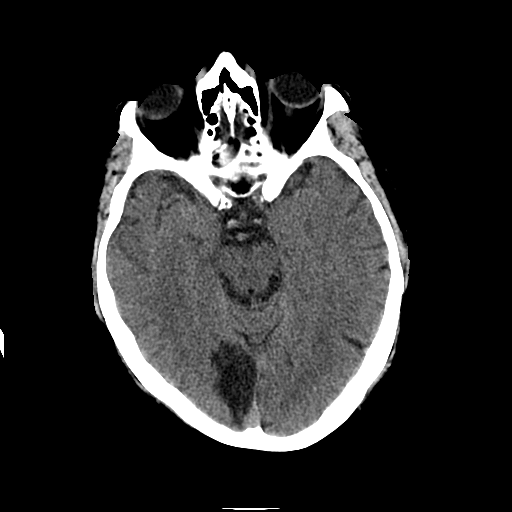
[im 12/34  bone]
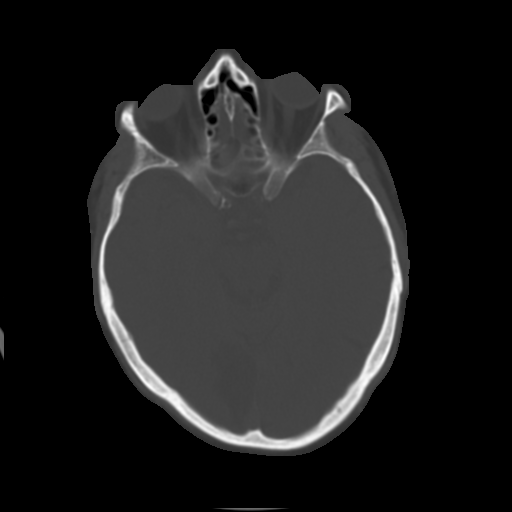
[im 15/34  brain]
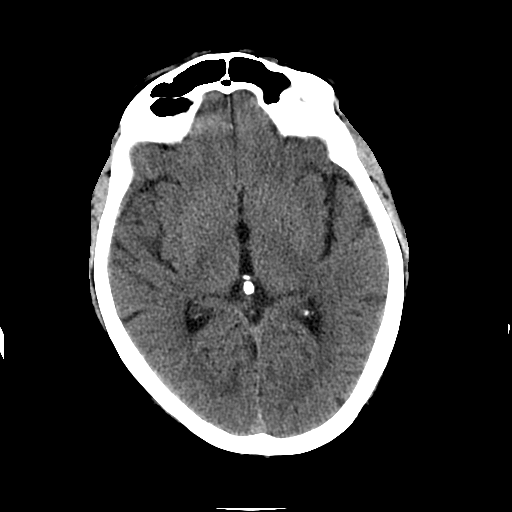
[im 17/34  brain]
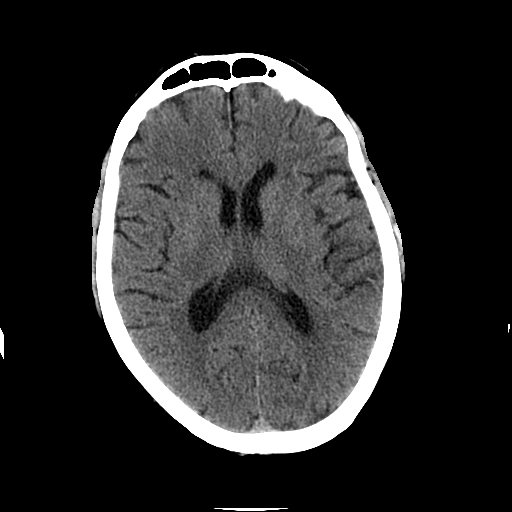
[im 19/34  brain]
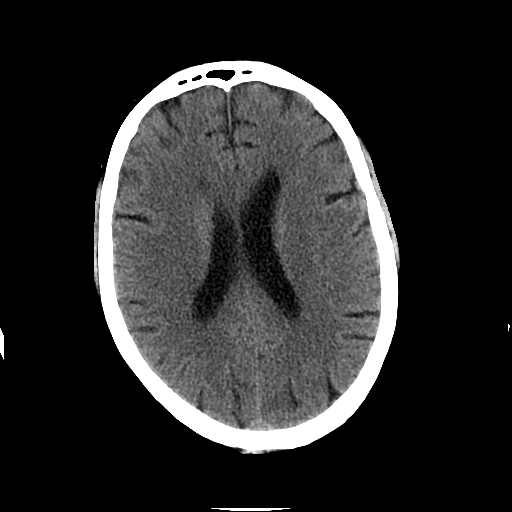
[im 22/34  brain]
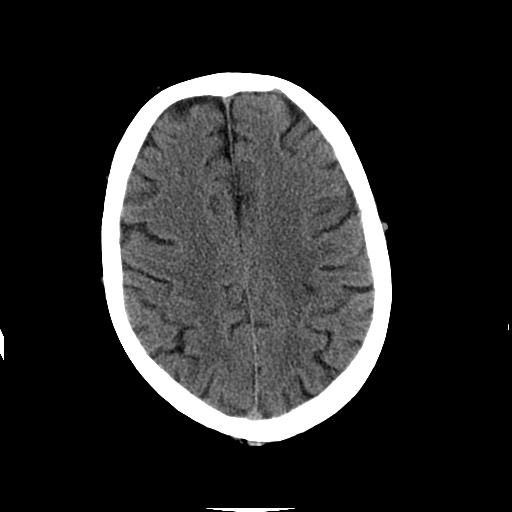
[im 22/34  bone]
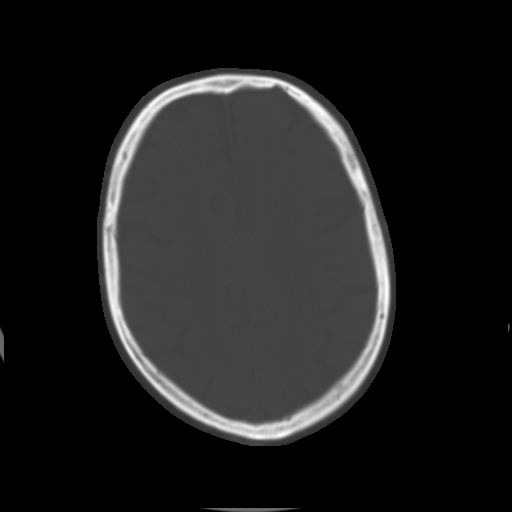
[im 24/34  brain]
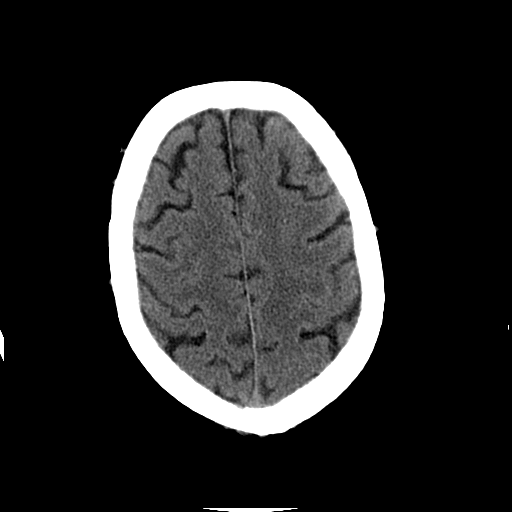
[im 26/34  brain]
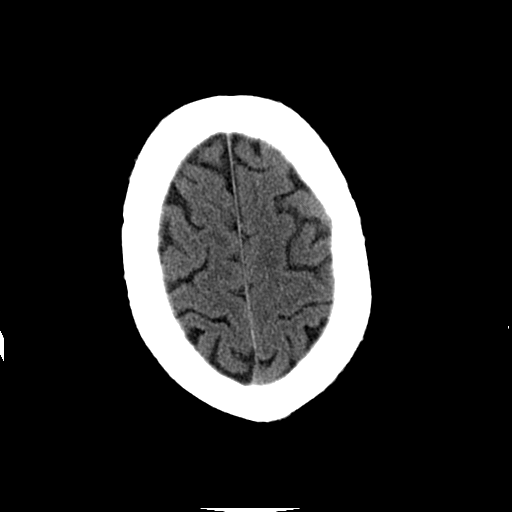
[im 29/34  brain]
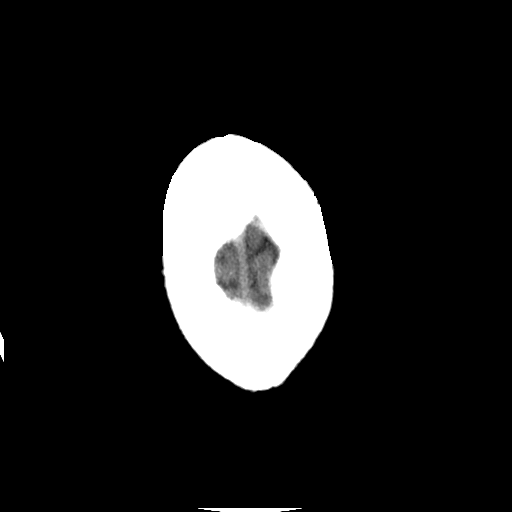
[im 31/34  brain]
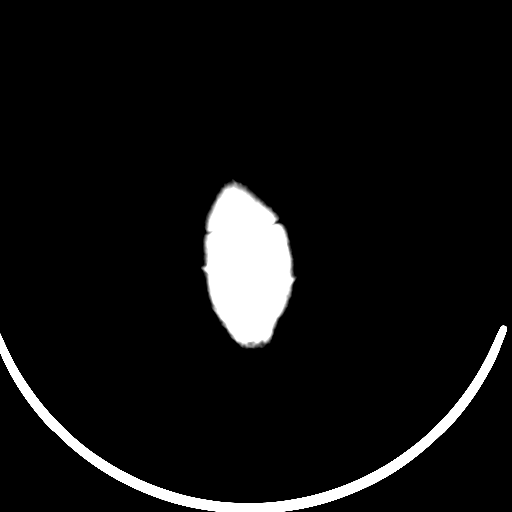
[im 31/34  bone]
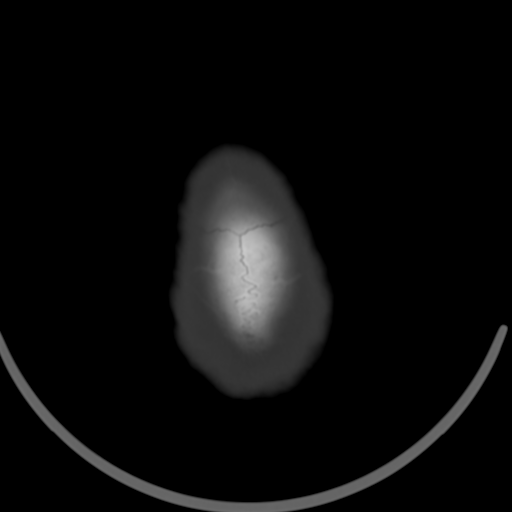

[Series 3: head w/o bone · axial · non-contrast · 0.49mm/px · z∈[+63,+108]mm · 3 of 34 slices shown]
[im 3/34  bone]
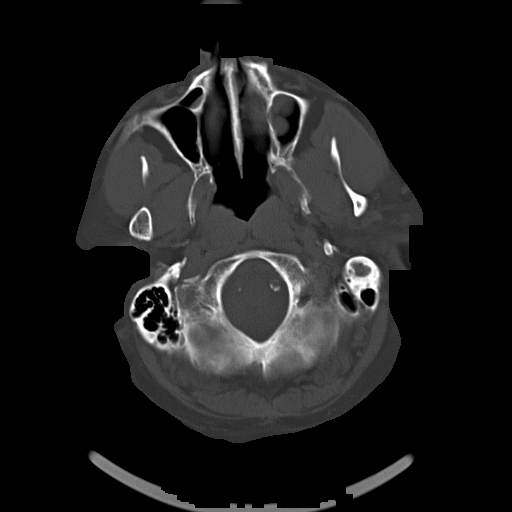
[im 8/34  bone]
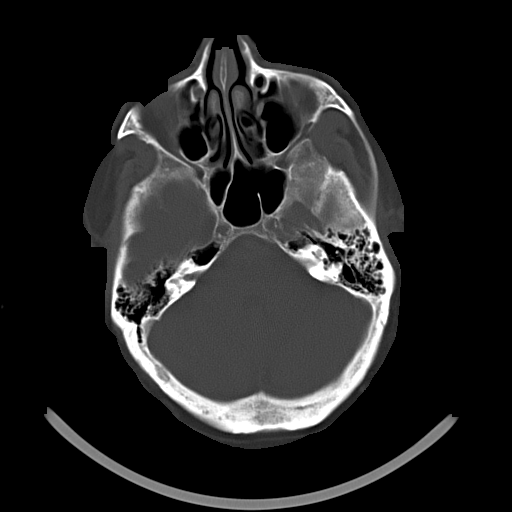
[im 12/34  bone]
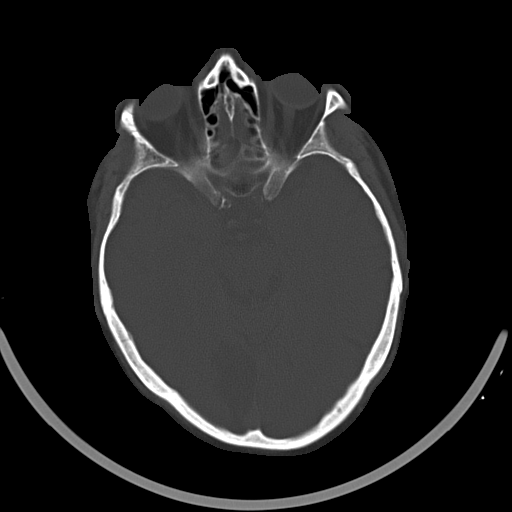

[16 of 30 positions shown; findings below may reference images not displayed]

FINDINGS: Ventricles are normal in size and configuration. There is no mass,
hemorrhage, extra-axial fluid collection, or midline shift. There is
evidence of old infarct in the medial right occipital lobe.
Elsewhere, gray-white compartments are normal. There is no
demonstrable acute infarct.

Bony calvarium appears intact. The mastoid air cells are clear.
IMPRESSION: Remote medial aspect right occipital lobe infarct. Study otherwise
unremarkable. In particular, there is no appreciable mass,
hemorrhage, or acute appearing infarct.

## 2015-01-23 ENCOUNTER — Encounter: Payer: Self-pay | Admitting: Internal Medicine

## 2015-01-23 ENCOUNTER — Ambulatory Visit (INDEPENDENT_AMBULATORY_CARE_PROVIDER_SITE_OTHER): Payer: Medicare Other | Admitting: Internal Medicine

## 2015-01-23 VITALS — BP 118/68 | HR 81 | Ht 71.0 in | Wt 328.4 lb

## 2015-01-23 DIAGNOSIS — Z23 Encounter for immunization: Secondary | ICD-10-CM | POA: Diagnosis not present

## 2015-01-23 DIAGNOSIS — G4733 Obstructive sleep apnea (adult) (pediatric): Secondary | ICD-10-CM

## 2015-01-23 DIAGNOSIS — I255 Ischemic cardiomyopathy: Secondary | ICD-10-CM

## 2015-01-23 NOTE — Progress Notes (Signed)
Subjective:    Patient ID: Kerry Waters, male    DOB: 08-Jun-1945, 69 y.o.   MRN: [id number]  HPI 12/24/10- 69 year old male former smoker followed for obstructive sleep apnea complicated by obesity, HBP CAD/MI/ hx SVT. Last here May 08, 2010  Wearing right boot for diabetic ulcer/ getting hyperbaric treatments.  He continues CPAP all night every night 19 cwp/ [facility]. Compliance and effectiveness are great, with no complaints from home that he is snoring.  Denies lung concerns. Followed now by Dr Percival Spanish for cardiology.   12/24/11- 69 year old male former smoker followed for obstructive sleep apnea complicated by obesity, HBP CAD/MI/ hx SVT. Wears CPAP19/ [facility] every night for approximately 8 hours; pressure doing well for patient. Rhinitis-mild nasal congestion and some wheeze in the last week. Does not feel sick. He wants to wait on flu vaccine due to this.  12/25/12- 69 year old male former smoker followed for obstructive sleep apnea complicated by obesity, HBP CAD/MI/ hx SVT. FOLLOWS [id number] CPAP 19/ [facility] every night for 6-8 hours; pressure working well for patient. No acute concerns this visit. We discussed comfort and alternatives to CPAP  12/27/13- 69 year old male former smoker followed for obstructive sleep apnea complicated by obesity, HBP CAD/MI/ hx SVT. FOLLOWS FOR:  wears his CPAP 19/ [facility] every night for about 6-8 hours.  feels that his pressure is doing well.   106/16-69 year old male former smoker followed for obstructive sleep apnea complicated by obesity, HBP CAD/MI/ CABGhx SVT, ischemic CM, DM2 CPAP 19/ [facility] FOLLOWS FOR: DME is [facility]; no recent DL; pt wears CPAP every night for aobut 6-8 hours; pressure works well for patient. Mask is "irritating". Creates pressure marks on the bridge of his nose. We discussed comfort measures and mask choices.  Review of Systems- HPI Constitutional:   No-   weight loss, night  sweats, fevers, chills, fatigue, lassitude. HEENT:   No-  headaches, difficulty swallowing, tooth/dental problems, sore throat,       No-  sneezing, itching, ear ache, nasal congestion, post nasal drip,  CV:  No-   chest pain, orthopnea, PND, swelling in lower extremities, anasarca, dizziness, palpitations Resp: No-   shortness of breath with exertion or at rest.              No-   productive cough,  No non-productive cough,  No-  coughing up of blood.              No-   change in color of mucus.  No- wheezing.   Skin: No-   rash or lesions. GI:  No-   heartburn, indigestion, abdominal pain, nausea, vomiting,  GU:  MS:  No-   joint pain or swelling.  + Diabetic foot ulcers Neuro- Psych:  No- change in mood or affect. No depression or anxiety.  No memory loss. Objective:   Physical Exam General- Alert, Oriented, Affect-appropriate, Distress- none acute   Very obese Skin- + pressure mark bridge of nose Lymphadenopathy- none Head- atraumatic            Eyes- Gross vision intact, PERRLA, conjunctivae clear secretions            Ears- Hearing, canals - normal             Nose-  No- Septal dev, mucus, polyps, erosion, perforation             Throat- Mallampati IV , mucosa clear , drainage- none, tonsils-  atrophic  Tongue coated Neck- flexible , trachea midline, no stridor , thyroid nl, carotid no bruit Chest - symmetrical excursion , unlabored           Heart/CV- RRR , no murmur , no gallop  , no rub, nl s1 s2                           - JVD- none , edema- 2+/ support hose, stasis changes- none, varices- none           Lung- clear to P&A, wheeze- none, cough- none , dullness-none, rub- none           Chest wall-  Abd-  Br/ Gen/ Rectal- Not done, not indicated Extrem- cyanosis- none, clubbing, none, atrophy- none, strength- nl   Neuro- grossly intact to observation    Assessment & Plan:

## 2015-01-23 NOTE — Patient Instructions (Signed)
Flu vax  You should take your mask to [facility] and talk with them about the pressure mark on the bridge of your nose and your sense that the mask material is irritating. See what they can show you as alternatives.  Order- DME [facility]   Download for pressure compliance documentation   Dx OSA,  Add AirView if available

## 2015-01-29 NOTE — Assessment & Plan Note (Signed)
Weight loss recommended. This would benefit several of his medical problems as pointed out.

## 2015-01-29 NOTE — Assessment & Plan Note (Signed)
He has been set at a high pressure, 19, requiring tighter mask fit. We will request download which will help reassess pressure requirement. Meanwhile he will talk with his homecare company about mask alternatives. He still recognizes benefit from CPAP and continues to use it all night every night.

## 2015-02-06 DIAGNOSIS — M65351 Trigger finger, right little finger: Secondary | ICD-10-CM | POA: Diagnosis not present

## 2015-02-07 DIAGNOSIS — I10 Essential (primary) hypertension: Secondary | ICD-10-CM | POA: Diagnosis not present

## 2015-02-07 DIAGNOSIS — E1151 Type 2 diabetes mellitus with diabetic peripheral angiopathy without gangrene: Secondary | ICD-10-CM | POA: Diagnosis not present

## 2015-02-07 DIAGNOSIS — Z6841 Body Mass Index (BMI) 40.0 and over, adult: Secondary | ICD-10-CM | POA: Diagnosis not present

## 2015-02-07 DIAGNOSIS — Z794 Long term (current) use of insulin: Secondary | ICD-10-CM | POA: Diagnosis not present

## 2015-02-17 ENCOUNTER — Encounter: Payer: Self-pay | Admitting: Internal Medicine

## 2015-03-19 ENCOUNTER — Other Ambulatory Visit: Payer: Self-pay | Admitting: Physician Assistant

## 2015-06-12 DIAGNOSIS — E1140 Type 2 diabetes mellitus with diabetic neuropathy, unspecified: Secondary | ICD-10-CM | POA: Diagnosis not present

## 2015-06-12 DIAGNOSIS — Z125 Encounter for screening for malignant neoplasm of prostate: Secondary | ICD-10-CM | POA: Diagnosis not present

## 2015-06-12 DIAGNOSIS — E784 Other hyperlipidemia: Secondary | ICD-10-CM | POA: Diagnosis not present

## 2015-06-12 DIAGNOSIS — N183 Chronic kidney disease, stage 3 (moderate): Secondary | ICD-10-CM | POA: Diagnosis not present

## 2015-06-12 DIAGNOSIS — I2510 Atherosclerotic heart disease of native coronary artery without angina pectoris: Secondary | ICD-10-CM | POA: Diagnosis not present

## 2015-06-19 DIAGNOSIS — Z1389 Encounter for screening for other disorder: Secondary | ICD-10-CM | POA: Diagnosis not present

## 2015-06-19 DIAGNOSIS — I130 Hypertensive heart and chronic kidney disease with heart failure and stage 1 through stage 4 chronic kidney disease, or unspecified chronic kidney disease: Secondary | ICD-10-CM | POA: Diagnosis not present

## 2015-06-19 DIAGNOSIS — I739 Peripheral vascular disease, unspecified: Secondary | ICD-10-CM | POA: Diagnosis not present

## 2015-06-19 DIAGNOSIS — G4733 Obstructive sleep apnea (adult) (pediatric): Secondary | ICD-10-CM | POA: Diagnosis not present

## 2015-06-19 DIAGNOSIS — E1151 Type 2 diabetes mellitus with diabetic peripheral angiopathy without gangrene: Secondary | ICD-10-CM | POA: Diagnosis not present

## 2015-06-19 DIAGNOSIS — E784 Other hyperlipidemia: Secondary | ICD-10-CM | POA: Diagnosis not present

## 2015-06-19 DIAGNOSIS — Z0000 Encounter for general adult medical examination without abnormal findings: Secondary | ICD-10-CM | POA: Diagnosis not present

## 2015-06-19 DIAGNOSIS — Z6841 Body Mass Index (BMI) 40.0 and over, adult: Secondary | ICD-10-CM | POA: Diagnosis not present

## 2015-06-19 DIAGNOSIS — Z794 Long term (current) use of insulin: Secondary | ICD-10-CM | POA: Diagnosis not present

## 2015-06-19 DIAGNOSIS — I6529 Occlusion and stenosis of unspecified carotid artery: Secondary | ICD-10-CM | POA: Diagnosis not present

## 2015-06-27 DIAGNOSIS — Z1212 Encounter for screening for malignant neoplasm of rectum: Secondary | ICD-10-CM | POA: Diagnosis not present

## 2015-07-13 DIAGNOSIS — Z1212 Encounter for screening for malignant neoplasm of rectum: Secondary | ICD-10-CM | POA: Diagnosis not present

## 2015-07-13 DIAGNOSIS — Z1211 Encounter for screening for malignant neoplasm of colon: Secondary | ICD-10-CM | POA: Diagnosis not present

## 2015-08-26 DIAGNOSIS — H53462 Homonymous bilateral field defects, left side: Secondary | ICD-10-CM | POA: Diagnosis not present

## 2015-09-19 ENCOUNTER — Other Ambulatory Visit: Payer: Self-pay | Admitting: Cardiology

## 2015-09-19 NOTE — Telephone Encounter (Signed)
Rx(s) sent to pharmacy electronically.  

## 2015-09-23 NOTE — Progress Notes (Signed)
In [facility] The patient presents as a follow up of CAD. He has an ischemic cardiomyopathy.  His last ejection fraction was about 35%. This was actually slightly higher than previous. He's had a follow-up stress perfusion study with inferolateral infarct with some mild peri-infarct ischemia. He was managed medically. He has had stroke. He was initially on aspirin and Plavix but had some peri-infarct hemorrhagic conversion with this and so is on aspirin only. He is limited in his activities because of joint pain.  He denies any new cardiovascular symptoms. He has no new shortness of breath, PND or orthopnea. He has no chest pressure, neck or arm discomfort. He hasn't required any nitroglycerin.    No Known Allergies  Current Outpatient Prescriptions  Medication Sig Dispense Refill  . aspirin EC 325 MG tablet Take 325 mg by mouth daily.    [name] atorvastatin (LIPITOR) 80 MG tablet Take 80 mg by mouth daily.    . Canagliflozin (INVOKANA) 100 MG TABS Take 100 mg by mouth daily.     . carvedilol (COREG) 25 MG tablet TAKE ONE TABLET BY MOUTH TWICE DAILY WITH MEALS 180 tablet 2  . Coenzyme Q10 (COQ10 PO) Take 300 mg by mouth daily.    . furosemide (LASIX) 20 MG tablet Take 20 mg by mouth daily as needed for fluid.    [name] HYDROcodone-acetaminophen (NORCO) 5-325 MG per tablet Take 1-2 tablets by mouth every 4 (four) hours as needed for moderate pain. 40 tablet 0  . insulin NPH-regular Human (NOVOLIN 70/30) (70-30) 100 UNIT/ML injection Inject 40 Units into the skin daily with breakfast. 40 units every morning after breakfast and 37 units after dinner or evening meal. Per patient    . lisinopril (PRINIVIL,ZESTRIL) 20 MG tablet TAKE ONE TABLET BY MOUTH ONCE DAILY 90 tablet 2  . metFORMIN (GLUCOPHAGE) 500 MG tablet Take 1,000 mg by mouth daily with breakfast.     . Multiple Vitamin (MULTIVITAMIN WITH MINERALS) TABS tablet Take 1 tablet by mouth daily.    . nitroGLYCERIN (NITROSTAT) 0.4 MG SL tablet Place 1 tablet  (0.4 mg total) under the tongue every 5 (five) minutes as needed for chest pain. 25 tablet 11  . sitaGLIPtin-metformin (JANUMET) 50-500 MG tablet Take 1 tablet by mouth daily. Reported on 09/24/2015     No current facility-administered medications for this visit.    Past Medical History  Diagnosis Date  . Toe osteomyelitis, right (HCC)     a. s/p R great toe amputation  . History of MI (myocardial infarction) 1987    ANTERIOR SEPTAL  . Hypertension   . Obesities, morbid ([place])   . SVT (supraventricular tachycardia) ([place])     a. 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  . Diabetes mellitus     Dr. [name] follows.  [name] PVD (peripheral vascular disease) ([place])   . Venous stasis   . Peripheral neuropathy ([place])   . Hyperlipidemia   . Sleep apnea     CPAP  . Basal cell carcinoma   . History of stroke     occipital in 04/2012 tx [facility]  . Sepsis ([place]) 12/2012  . Coronary artery disease     a. s/p CABG 1993;  b  Lexiscan 2015  . Ischemic cardiomyopathy     a.  Echo (01/08/2013): Mild LVH, EF 25-30%, AS akinesis, Inf HK, Ant HK, Gr 1 DD, Ao sclerosis, no AS,  mod MAC, mild LAE.;   b.  Echo (05/10/13): EF 35-40%, anteroseptal, anterior, anterolateral and apical HK.  . Carotid stenosis     a. Carotid US (04/2012):  bilateral ICA 40-59%  . NSTEMI (non-ST elevated myocardial infarction) ([place])     a. 12/2012 in setting of sepsis syndrome - EF 25-30% on echo; ? Type 2 NSTEMI; b. f/u echo 04/2013 with EF 35-40%; Myoview with inf and AL infarct and peri-infarct ischemia, EF 38% => Med Rx recommended    Past Surgical History  Procedure Laterality Date  . Coronary artery bypass graft  1993    LIMA to LAD with patch angioplasty, SVG to OM, SVG to OM & PD and patch angioplasy to PDA  . Left ankle orif  2000  . Pilonidal cyst excision  30 yrs. ago x2  . Amputation Right 06/22/2013     Procedure: AMPUTATION DIGIT- right great toe;  Surgeon: Newt Minion, MD;  Location: [place];  Service: Orthopedics;  Laterality: Right;  Right Great Toe Amputation at [facility]    ROS:  Mild loss of vision improved .  Otherwise  stated in the HPI and negative for all other systems.  PHYSICAL EXAM BP 166/78 mmHg  Pulse 59  Ht 5\' 11"  (1.803 m)  Wt 316 lb 9.6 oz (143.609 kg)  BMI 44.18 kg/m2 GENERAL:  Well appearing NECK:  No jugular venous distention, waveform within normal limits, carotid upstroke brisk and symmetric, questionable left bruits, no thyromegaly LYMPHATICS:  No cervical, inguinal adenopathy LUNGS:  Clear to auscultation bilaterally CHEST:  Well healed sternotomy scar. HEART:  PMI not displaced or sustained,S1 and S2 within normal limits, no S3, no S4, no clicks, no rubs, apical systolic murmur radiating out the outflow tract ABD:  Flat, positive bowel sounds normal in frequency in pitch, no bruits, no rebound, no guarding, no midline pulsatile mass, no hepatomegaly, no splenomegaly, morbidly obese EXT:  2 plus pulses upper, two plus PT bilateral, no edema, no cyanosis no clubbing, bandaged right great toe NEURO:  Cranial nerves II through XII grossly intact, motor grossly intact throughout  EKG:  Normal sinus rhythm, rate 59, left axis deviation, nonspecific T-wave flattening, no acute ST-T wave changes. Premature ectopic complexes. 09/24/2015  ASSESSMENT AND PLAN  CVA He remains off Plavix and he is on aspirin onlyhe has follow-up with Dr. Kellie Simmering routinely for his carotid stenosis. No change in therapy is indicated.  patient has no new sypmtoms since his most recent stress test. No further cardiovascular testing is indicated. We will continue with aggressive risk reduction and meds as listed.   HYPERTENSION -  The blood pressure is slightly elevated. He will keep a blood pressure diary. I will manage this would defer to Dr. [name] based on those readings.   Obesities, morbid  -  He has lost a little weight and we talked about specifics for management of this.   CAD The patient has no new sypmtoms.  No further cardiovascular testing is indicated.  We will continue with aggressive risk reduction and meds as listed.  DM His A1C was greater than 9 and he is working on this with Dr. Janett Billow

## 2015-09-24 ENCOUNTER — Encounter: Payer: Self-pay | Admitting: Cardiology

## 2015-09-24 ENCOUNTER — Ambulatory Visit (INDEPENDENT_AMBULATORY_CARE_PROVIDER_SITE_OTHER): Payer: Medicare Other | Admitting: Cardiology

## 2015-09-24 VITALS — BP 166/78 | HR 59 | Ht 71.0 in | Wt 316.6 lb

## 2015-09-24 DIAGNOSIS — E785 Hyperlipidemia, unspecified: Secondary | ICD-10-CM

## 2015-09-24 NOTE — Patient Instructions (Signed)
Your physician wants you to follow-up in: 1 Year. You will receive a reminder letter in the mail two months in advance. If you don't receive a letter, please call our office to schedule the follow-up appointment.  

## 2015-10-17 ENCOUNTER — Encounter: Payer: Self-pay | Admitting: Vascular Surgery

## 2015-10-20 DIAGNOSIS — N183 Chronic kidney disease, stage 3 (moderate): Secondary | ICD-10-CM | POA: Diagnosis not present

## 2015-10-20 DIAGNOSIS — I495 Sick sinus syndrome: Secondary | ICD-10-CM | POA: Diagnosis not present

## 2015-10-20 DIAGNOSIS — Z89411 Acquired absence of right great toe: Secondary | ICD-10-CM | POA: Diagnosis not present

## 2015-10-20 DIAGNOSIS — Z794 Long term (current) use of insulin: Secondary | ICD-10-CM | POA: Diagnosis not present

## 2015-10-20 DIAGNOSIS — E1140 Type 2 diabetes mellitus with diabetic neuropathy, unspecified: Secondary | ICD-10-CM | POA: Diagnosis not present

## 2015-10-20 DIAGNOSIS — I5022 Chronic systolic (congestive) heart failure: Secondary | ICD-10-CM | POA: Diagnosis not present

## 2015-10-20 DIAGNOSIS — E1151 Type 2 diabetes mellitus with diabetic peripheral angiopathy without gangrene: Secondary | ICD-10-CM | POA: Diagnosis not present

## 2015-10-20 DIAGNOSIS — Z6841 Body Mass Index (BMI) 40.0 and over, adult: Secondary | ICD-10-CM | POA: Diagnosis not present

## 2015-10-20 DIAGNOSIS — I130 Hypertensive heart and chronic kidney disease with heart failure and stage 1 through stage 4 chronic kidney disease, or unspecified chronic kidney disease: Secondary | ICD-10-CM | POA: Diagnosis not present

## 2015-10-20 DIAGNOSIS — I739 Peripheral vascular disease, unspecified: Secondary | ICD-10-CM | POA: Diagnosis not present

## 2015-10-28 ENCOUNTER — Ambulatory Visit: Payer: Medicare Other | Admitting: Vascular Surgery

## 2015-10-28 ENCOUNTER — Encounter (HOSPITAL_COMMUNITY): Payer: Medicare Other

## 2015-11-03 ENCOUNTER — Ambulatory Visit (INDEPENDENT_AMBULATORY_CARE_PROVIDER_SITE_OTHER): Payer: Medicare Other | Admitting: Podiatry

## 2015-11-03 ENCOUNTER — Encounter: Payer: Self-pay | Admitting: Podiatry

## 2015-11-03 VITALS — BP 136/76 | HR 67 | Resp 16

## 2015-11-03 DIAGNOSIS — B351 Tinea unguium: Secondary | ICD-10-CM

## 2015-11-03 DIAGNOSIS — M79676 Pain in unspecified toe(s): Secondary | ICD-10-CM | POA: Diagnosis not present

## 2015-11-03 NOTE — Progress Notes (Signed)
   Subjective:    Patient ID: Kerry Waters, male    DOB: 26-Apr-1945, 70 y.o.   MRN: [id number]  HPI this patient presents to the office for preventative foot care services. Due to his long nails. This patient is a diabetic who had an amputation of his right great toe due to an infection in the right hallux. He was sent over by his medical doctor for treatment. He states that he wears Propet  a diabetic shoes full-time. He states his nails are painful walking and wearing his shoes. He presents the office today for an evaluation and treatment of this condition    Review of Systems  All other systems reviewed and are negative.      Objective:   Physical Exam GENERAL APPEARANCE: Alert, conversant. Appropriately groomed. No acute distress.  VASCULAR: Pedal pulses are not p alpable at  [facility] and PT bilateral due to excessive swelling.  Capillary refill time is immediate to all digits,  Normal temperature gradient.  NEUROLOGIC: sensation is absent  to 5.07 monofilament at 5/5 sites bilateral.  Light touch is intact bilateral, Muscle strength normal.  MUSCULOSKELETAL: acceptable muscle strength, tone and stability bilateral.  Intrinsic muscluature intact bilateral.  Rectus appearance of foot and digits noted bilateral. Amputation right hallux.  DERMATOLOGIC: skin color, texture, and turgor are within normal limits.  No preulcerative lesions or ulcers  are seen, no interdigital maceration noted.  No open lesions present.  Digital nails are asymptomatic. No drainage noted.         Assessment & Plan:  Onychomycosis with diabetic angiopathy and neuropathy.  IE  Debridement of Nails  B/L  RTC 3 months    Gardiner Barefoot DPM

## 2016-01-06 ENCOUNTER — Encounter: Payer: Self-pay | Admitting: Vascular Surgery

## 2016-01-13 ENCOUNTER — Ambulatory Visit (INDEPENDENT_AMBULATORY_CARE_PROVIDER_SITE_OTHER): Payer: Medicare Other | Admitting: Vascular Surgery

## 2016-01-13 ENCOUNTER — Ambulatory Visit (HOSPITAL_COMMUNITY)
Admission: RE | Admit: 2016-01-13 | Discharge: 2016-01-13 | Disposition: A | Discharge status: Home / Self Care | Payer: Medicare Other | Source: Ambulatory Visit | Attending: Vascular Surgery | Admitting: Vascular Surgery

## 2016-01-13 ENCOUNTER — Encounter: Payer: Self-pay | Admitting: Vascular Surgery

## 2016-01-13 VITALS — BP 144/70 | HR 62 | Temp 97.2°F | Resp 18 | Ht 71.0 in | Wt 326.0 lb

## 2016-01-13 DIAGNOSIS — I6523 Occlusion and stenosis of bilateral carotid arteries: Secondary | ICD-10-CM | POA: Diagnosis not present

## 2016-01-13 DIAGNOSIS — I6521 Occlusion and stenosis of right carotid artery: Secondary | ICD-10-CM

## 2016-01-13 LAB — VAS US CAROTID
LCCADSYS: -94 cm/s
LCCAPDIAS: 20 cm/s
LEFT ECA DIAS: -9 cm/s
LEFT VERTEBRAL DIAS: 13 cm/s
LICADSYS: -74 cm/s
LICAPDIAS: -26 cm/s
Left CCA dist dias: -16 cm/s
Left CCA prox sys: 99 cm/s
Left ICA dist dias: -17 cm/s
Left ICA prox sys: -111 cm/s
RIGHT CCA MID DIAS: -11 cm/s
RIGHT ECA DIAS: -20 cm/s
RIGHT VERTEBRAL DIAS: -9 cm/s
Right CCA prox dias: -12 cm/s
Right CCA prox sys: -59 cm/s
Right cca dist sys: -101 cm/s

## 2016-01-13 NOTE — Progress Notes (Signed)
Patient name: Kerry Waters MRN: [id number] DOB: May 23, 1945 Sex: male    HPI: Kerry Waters is a 70 y.o. male,  Who is here for a repeat carotid duplex. he had a stroke in January 2014 which affected his vision somewhat and states that he also had some tingling in some fingers of the left hand. Otherwise he is relatively free of any neurologic deficits. Recently had a carotid ultrasound performed at [facility] which was interpreted as having 50-69% narrowing on the left and less than 50% on the right ICA. He denies any lateralizing weakness, aphasia, amaurosis fugax, diplopia, blurred vision, or syncope.  Other medical problems include He is known to me having previously undergone laser ablation procedures in his saphenous veins for severe venous insufficiency with edema and skin changes.  He continues to take Lipitor and 325 mg Aspirin daily.    Past Medical History:  Diagnosis Date  . Basal cell carcinoma   . Carotid stenosis    a. Carotid US (04/2012):  bilateral ICA 40-59%  . Coronary artery disease    a. s/p CABG 1993;  b  Lexiscan 2015  . Diabetes mellitus    Dr. [name] follows.  [name] History of MI (myocardial infarction) 1987   ANTERIOR SEPTAL  . History of stroke    occipital in 04/2012 tx at [facility]  . Hyperlipidemia   . Hypertension   . Ischemic cardiomyopathy    a.  Echo (01/08/2013): Mild LVH, EF 25-30%, AS akinesis, Inf HK, Ant HK, Gr 1 DD, Ao sclerosis, no AS, mod MAC, mild LAE.;   b.  Echo (05/10/13): EF 35-40%, anteroseptal, anterior, anterolateral and apical HK.  . NSTEMI (non-ST elevated myocardial infarction) ([place])    a. 12/2012 in setting of sepsis syndrome - EF 25-30% on echo; ? Type 2 NSTEMI; b. f/u echo 04/2013 with EF 35-40%; Myoview with inf and AL infarct and peri-infarct ischemia, EF 38% => Med Rx recommended  . Obesities, morbid ([place])   . Peripheral neuropathy ([place])   . PVD (peripheral vascular disease) ([place])   . Sepsis ([place]) 12/2012  . Sleep apnea     CPAP  . SVT (supraventricular tachycardia) ([place])    a. 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  . Toe osteomyelitis, right ([place])    a. s/p R great toe amputation  . Venous stasis    Past Surgical History:  Procedure Laterality Date  . AMPUTATION Right 06/22/2013   Procedure: AMPUTATION DIGIT- right great toe;  Surgeon: Newt Minion, MD;  Location: [place];  Service: Orthopedics;  Laterality: Right;  Right Great Toe Amputation at [facility]  . CORONARY ARTERY BYPASS GRAFT  1993   LIMA to LAD with patch angioplasty, SVG to OM, SVG to OM & PD and patch angioplasy to PDA  . Left ankle ORIF  2000  . PILONIDAL CYST EXCISION  30 yrs. ago x2    Family History  Problem Relation Age of Onset  . Heart attack Father   . Heart disease Father     before age 46  . Cancer Mother     stomach cancer  . Heart attack Maternal Grandfather   . Heart attack Paternal Grandfather   . Cancer Sister     breast  . Diabetes Son     SOCIAL HISTORY: Social History   Social History  . Marital status: Married  Spouse name: N/A  . Number of children: N/A  . Years of education: N/A   Occupational History  . Not on file.   Social History Main Topics  . Smoking status: Former Smoker    Packs/day: 1.00    Years: 30.00    Types: Cigarettes    Quit date: 04/19/1985  . Smokeless tobacco: Never Used  . Alcohol use 0.0 oz/week     Comment: 3x a week  . Drug use: No  . Sexual activity: Not on file   Other Topics Concern  . Not on file   Social History Narrative  . No narrative on file    No Known Allergies  Current Outpatient Prescriptions  Medication Sig Dispense Refill  . aspirin EC 325 MG tablet Take 325 mg by mouth daily.    [name] atorvastatin (LIPITOR) 80 MG tablet Take 80 mg by mouth daily.    . Canagliflozin (INVOKANA) 100 MG TABS Take 100 mg by mouth daily.     .  carvedilol (COREG) 25 MG tablet TAKE ONE TABLET BY MOUTH TWICE DAILY WITH MEALS 180 tablet 2  . Coenzyme Q10 (COQ10 PO) Take 300 mg by mouth daily.    . furosemide (LASIX) 20 MG tablet Take 20 mg by mouth daily as needed for fluid.    [name] HYDROcodone-acetaminophen (NORCO) 5-325 MG per tablet Take 1-2 tablets by mouth every 4 (four) hours as needed for moderate pain. 40 tablet 0  . insulin NPH-regular Human (NOVOLIN 70/30) (70-30) 100 UNIT/ML injection Inject 40 Units into the skin daily with breakfast. 40 units every morning after breakfast and 37 units after dinner or evening meal. Per patient    . lisinopril (PRINIVIL,ZESTRIL) 20 MG tablet TAKE ONE TABLET BY MOUTH ONCE DAILY 90 tablet 2  . metFORMIN (GLUCOPHAGE) 500 MG tablet Take 1,000 mg by mouth daily with breakfast.     . Multiple Vitamin (MULTIVITAMIN WITH MINERALS) TABS tablet Take 1 tablet by mouth daily.    . nitroGLYCERIN (NITROSTAT) 0.4 MG SL tablet Place 1 tablet (0.4 mg total) under the tongue every 5 (five) minutes as needed for chest pain. 25 tablet 11  . sitaGLIPtin-metformin (JANUMET) 50-500 MG tablet Take 1 tablet by mouth daily. Reported on 09/24/2015     No current facility-administered medications for this visit.     ROS:   General:  No weight loss, Fever, chills  HEENT: No recent headaches, no nasal bleeding, no visual changes, no sore throat  Neurologic: No dizziness, blackouts, seizures. No recent symptoms of stroke or mini- stroke. No recent episodes of slurred speech, or temporary blindness.  Cardiac: No recent episodes of chest pain/pressure, no shortness of breath at rest.  No shortness of breath with exertion.  Denies history of atrial fibrillation or irregular heartbeat  Vascular: No history of rest pain in feet.  No history of claudication.  No history of non-healing ulcer, No history of DVT   Pulmonary: No home oxygen, no productive cough, no hemoptysis,  No asthma or wheezing  Musculoskeletal:  [X]  Arthritis,  [ ]  Low back pain,  [X]  Joint pain  Hematologic:No history of hypercoagulable state.  No history of easy bleeding.  No history of anemia  Gastrointestinal: No hematochezia or melena,  No gastroesophageal reflux, no trouble swallowing  Urinary: [ ]  chronic Kidney disease, [ ]  on HD - [ ]  MWF or [ ]  TTHS, [ ]  Burning with urination, [ ]  Frequent urination, [ ]  Difficulty urinating;   Skin: No rashes  Psychological:  No history of anxiety,  No history of depression   Physical Examination  There were no vitals filed for this visit.  There is no height or weight on file to calculate BMI.  General:  Alert and oriented, no acute distress HEENT: Normal Neck: No bruit or JVD Pulmonary: Clear to auscultation bilaterally Cardiac: Regular Rate and Rhythm without murmur Abdomen: Soft, non-tender, non-distended, no mass, no scars Skin: No rash Extremity Pulses:  2+ radial, brachial, femoral, dorsalis pedis, posterior tibial pulses bilaterally Musculoskeletal: No deformity or edema  Neurologic: Upper and lower extremity motor 5/5 and symmetric  DATA:  Carotid duplex  Right 40-59% without change from previous study 1 year ago Left 1-39%  ASSESSMENT:   Carotid stenosis asymptomatic   PLAN:   He is doing well without symptoms.  He will f/u in 1 year for repeat carotid duplex with our NP.  If he has symptoms of stroke which was reviewed today he will call 911.   Theda Sers, Mahrukh Seguin MAUREEN PA-C [facility]    The patient was seen in conjunction with Dr.Early today  I have examined the patient, reviewed and agree with above.  Curt Jews, MD 01/13/2016 4:13 PM

## 2016-01-23 ENCOUNTER — Ambulatory Visit (INDEPENDENT_AMBULATORY_CARE_PROVIDER_SITE_OTHER): Payer: Medicare Other | Admitting: Internal Medicine

## 2016-01-23 ENCOUNTER — Encounter: Payer: Self-pay | Admitting: Internal Medicine

## 2016-01-23 DIAGNOSIS — I6523 Occlusion and stenosis of bilateral carotid arteries: Secondary | ICD-10-CM | POA: Diagnosis not present

## 2016-01-23 DIAGNOSIS — G4733 Obstructive sleep apnea (adult) (pediatric): Secondary | ICD-10-CM

## 2016-01-23 NOTE — Assessment & Plan Note (Signed)
He has not been able to change lifestyle sufficient to accomplish meaningful weight loss. Not likely to be a bariatric surgery candidate with his cardiac history. Counseling encouraged.

## 2016-01-23 NOTE — Assessment & Plan Note (Signed)
Sleeping well with CPAP. Download confirms good compliance and control. High-pitched noise he hears sounds like an pressure ear leak from his system. Plan-DME to evaluate eligibility for replacement of old CPAP machine or repair of leak. Weight loss would help.

## 2016-01-23 NOTE — Progress Notes (Signed)
Subjective:    Patient ID: Kerry Waters, male    DOB: [birth year], 70 y.o.   MRN: [id number]  HPI  male former smoker followed for obstructive sleep apnea complicated by obesity, HBP CAD/MI/ hx SVT.   12/27/13- 70 year old male former smoker followed for obstructive sleep apnea complicated by obesity, HBP CAD/MI/ hx SVT. FOLLOWS FOR:  wears his CPAP 19/ [facility] every night for about 6-8 hours.  feels that his pressure is doing well.   106/16-70 year old male former smoker followed for obstructive sleep apnea complicated by obesity, HBP CAD/MI/ CABGhx SVT, ischemic CM, DM2 CPAP 19/ [facility] FOLLOWS FOR: DME is [facility]; no recent DL; pt wears CPAP every night for aobut 6-8 hours; pressure works well for patient. Mask is "irritating". Creates pressure marks on the bridge of his nose. We discussed comfort measures and mask choices.  01/23/2016-70 year old male former smoker followed for OSA, complicated by obesity, HBP, CAD/MI, CABG, history SVT, ischemic CM, DM 2 CPAP 19/[facility] FOLLOWS FOR: DME: [place] [place] [facility], Pt wears CPAP nightly for at least 8 hours. DL attached. Weight today 324 lbs Download confirms excellent compliance 98%/4 hours and excellent control AHI 3.1/hour. He is comfortable with the pressure but asks if his machine is worn out. He has learned to ignore a high pitched whistle and can't tell where it's coming from. Can't sleep without CPAP.  Review of Systems- HPI Constitutional:   No-   weight loss, night sweats, fevers, chills, fatigue, lassitude. HEENT:   No-  headaches, difficulty swallowing, tooth/dental problems, sore throat,       No-  sneezing, itching, ear ache, nasal congestion, post nasal drip,  CV:  No-   chest pain, orthopnea, PND, swelling in lower extremities, anasarca, dizziness, palpitations Resp: No-   shortness of breath with exertion or at rest.              No-   productive cough,  No non-productive cough,  No-   coughing up of blood.              No-   change in color of mucus.  No- wheezing.   Skin: No-   rash or lesions. GI:  No-   heartburn, indigestion, abdominal pain, nausea, vomiting,  GU:  MS:  No-   joint pain or swelling.  + Diabetic foot ulcers Neuro- Psych:  No- change in mood or affect. No depression or anxiety.  No memory loss. Objective:   Physical Exam General- Alert, Oriented, Affect-appropriate, Distress- none acute   +Very obese Skin- cleatr Lymphadenopathy- none Head- atraumatic            Eyes- Gross vision intact, PERRLA, conjunctivae clear secretions            Ears- Hearing, canals - normal             Nose-  No- Septal dev, mucus, polyps, erosion, perforation             Throat- Mallampati IV , mucosa clear , drainage- none, tonsils- atrophic   Neck- flexible , trachea midline, no stridor , thyroid nl, carotid no bruit Chest - symmetrical excursion , unlabored           Heart/CV- RRR ,  Murmur+1/LUSB , no gallop  , no rub, nl s1 s2                           -  JVD- none , edema- 2+/ support hose, stasis changes- none, varices- none           Lung- clear to P&A, wheeze- none, cough- none , dullness-none, rub- none           Chest wall-  Abd-  Br/ Gen/ Rectal- Not done, not indicated Extrem- cyanosis- none, clubbing, none, atrophy- none, strength- nl   Neuro- grossly intact to observation    Assessment & Plan:

## 2016-01-23 NOTE — Patient Instructions (Addendum)
Flu vax  Order- DME  [facility]  Please evaluate eligibility for replacement of old CPAP machine which is making unusual sound. Continue 19 cwp, mask of choice, humidifier, supplies, AirView    Dx OSA

## 2016-02-02 ENCOUNTER — Encounter: Payer: Self-pay | Admitting: Podiatry

## 2016-02-02 ENCOUNTER — Ambulatory Visit (INDEPENDENT_AMBULATORY_CARE_PROVIDER_SITE_OTHER): Payer: Medicare Other | Admitting: Podiatry

## 2016-02-02 VITALS — BP 163/81 | HR 66 | Resp 16

## 2016-02-02 DIAGNOSIS — M79676 Pain in unspecified toe(s): Secondary | ICD-10-CM

## 2016-02-02 DIAGNOSIS — B351 Tinea unguium: Secondary | ICD-10-CM

## 2016-02-02 NOTE — Progress Notes (Signed)
   Subjective:    Patient ID: Kerry Waters, male    DOB: [birth year], 70 y.o.   MRN: [id number]  HPI this patient presents to the office for preventative foot care services. Due to his long nails. This patient is a diabetic who had an amputation of his right great toe due to an infection in the right hallux. He was sent over by his medical doctor for treatment. He states that he wears Propet  a diabetic shoes full-time. He states his nails are painful walking and wearing his shoes. He presents the office today for an evaluation and treatment of this condition    Review of Systems  All other systems reviewed and are negative.      Objective:   Physical Exam GENERAL APPEARANCE: Alert, conversant. Appropriately groomed. No acute distress.  VASCULAR: Pedal pulses are not p alpable at  [facility] and PT bilateral due to excessive swelling.  Capillary refill time is immediate to all digits,  Normal temperature gradient.  NEUROLOGIC: sensation is absent  to 5.07 monofilament at 5/5 sites bilateral.  Light touch is intact bilateral, Muscle strength normal.  MUSCULOSKELETAL: acceptable muscle strength, tone and stability bilateral.  Intrinsic muscluature intact bilateral.  Rectus appearance of foot and digits noted bilateral. Amputation right hallux.  DERMATOLOGIC: skin color, texture, and turgor are within normal limits.  No preulcerative lesions or ulcers  are seen, no interdigital maceration noted.  No open lesions present.  Digital nails are asymptomatic. No drainage noted.         Assessment & Plan:  Onychomycosis with diabetic angiopathy and neuropathy.  IE  Debridement of Nails  B/L  RTC 3 months    Gardiner Barefoot DPM

## 2016-02-04 ENCOUNTER — Encounter: Payer: Self-pay | Admitting: Internal Medicine

## 2016-02-20 DIAGNOSIS — D692 Other nonthrombocytopenic purpura: Secondary | ICD-10-CM | POA: Diagnosis not present

## 2016-02-20 DIAGNOSIS — Z6841 Body Mass Index (BMI) 40.0 and over, adult: Secondary | ICD-10-CM | POA: Diagnosis not present

## 2016-02-20 DIAGNOSIS — G4733 Obstructive sleep apnea (adult) (pediatric): Secondary | ICD-10-CM | POA: Diagnosis not present

## 2016-02-20 DIAGNOSIS — I5022 Chronic systolic (congestive) heart failure: Secondary | ICD-10-CM | POA: Diagnosis not present

## 2016-02-20 DIAGNOSIS — Z89411 Acquired absence of right great toe: Secondary | ICD-10-CM | POA: Diagnosis not present

## 2016-02-20 DIAGNOSIS — I130 Hypertensive heart and chronic kidney disease with heart failure and stage 1 through stage 4 chronic kidney disease, or unspecified chronic kidney disease: Secondary | ICD-10-CM | POA: Diagnosis not present

## 2016-02-20 DIAGNOSIS — I6529 Occlusion and stenosis of unspecified carotid artery: Secondary | ICD-10-CM | POA: Diagnosis not present

## 2016-02-20 DIAGNOSIS — E1151 Type 2 diabetes mellitus with diabetic peripheral angiopathy without gangrene: Secondary | ICD-10-CM | POA: Diagnosis not present

## 2016-03-02 ENCOUNTER — Encounter (HOSPITAL_COMMUNITY): Payer: Self-pay | Admitting: Emergency Medicine

## 2016-03-02 ENCOUNTER — Emergency Department (HOSPITAL_COMMUNITY): Payer: Medicare Other

## 2016-03-02 ENCOUNTER — Emergency Department (HOSPITAL_COMMUNITY)
Admission: EM | Admit: 2016-03-02 | Discharge: 2016-03-02 | Disposition: A | Discharge status: Home / Self Care | Payer: Medicare Other | Attending: Emergency Medicine | Admitting: Emergency Medicine

## 2016-03-02 DIAGNOSIS — Z7984 Long term (current) use of oral hypoglycemic drugs: Secondary | ICD-10-CM | POA: Insufficient documentation

## 2016-03-02 DIAGNOSIS — Z7982 Long term (current) use of aspirin: Secondary | ICD-10-CM | POA: Insufficient documentation

## 2016-03-02 DIAGNOSIS — I2510 Atherosclerotic heart disease of native coronary artery without angina pectoris: Secondary | ICD-10-CM | POA: Diagnosis not present

## 2016-03-02 DIAGNOSIS — E1140 Type 2 diabetes mellitus with diabetic neuropathy, unspecified: Secondary | ICD-10-CM | POA: Diagnosis not present

## 2016-03-02 DIAGNOSIS — Z794 Long term (current) use of insulin: Secondary | ICD-10-CM | POA: Insufficient documentation

## 2016-03-02 DIAGNOSIS — Z951 Presence of aortocoronary bypass graft: Secondary | ICD-10-CM | POA: Diagnosis not present

## 2016-03-02 DIAGNOSIS — I5022 Chronic systolic (congestive) heart failure: Secondary | ICD-10-CM | POA: Insufficient documentation

## 2016-03-02 DIAGNOSIS — Z87891 Personal history of nicotine dependence: Secondary | ICD-10-CM | POA: Insufficient documentation

## 2016-03-02 DIAGNOSIS — S92511B Displaced fracture of proximal phalanx of right lesser toe(s), initial encounter for open fracture: Secondary | ICD-10-CM

## 2016-03-02 DIAGNOSIS — M25561 Pain in right knee: Secondary | ICD-10-CM | POA: Diagnosis not present

## 2016-03-02 DIAGNOSIS — W228XXA Striking against or struck by other objects, initial encounter: Secondary | ICD-10-CM | POA: Diagnosis not present

## 2016-03-02 DIAGNOSIS — I110 Hypertensive heart disease with heart failure: Secondary | ICD-10-CM | POA: Insufficient documentation

## 2016-03-02 DIAGNOSIS — Z85828 Personal history of other malignant neoplasm of skin: Secondary | ICD-10-CM | POA: Insufficient documentation

## 2016-03-02 DIAGNOSIS — S99921A Unspecified injury of right foot, initial encounter: Secondary | ICD-10-CM | POA: Diagnosis present

## 2016-03-02 DIAGNOSIS — I252 Old myocardial infarction: Secondary | ICD-10-CM | POA: Diagnosis not present

## 2016-03-02 DIAGNOSIS — Z8673 Personal history of transient ischemic attack (TIA), and cerebral infarction without residual deficits: Secondary | ICD-10-CM | POA: Insufficient documentation

## 2016-03-02 DIAGNOSIS — S92501A Displaced unspecified fracture of right lesser toe(s), initial encounter for closed fracture: Secondary | ICD-10-CM | POA: Diagnosis not present

## 2016-03-02 DIAGNOSIS — Y999 Unspecified external cause status: Secondary | ICD-10-CM | POA: Diagnosis not present

## 2016-03-02 DIAGNOSIS — Y92009 Unspecified place in unspecified non-institutional (private) residence as the place of occurrence of the external cause: Secondary | ICD-10-CM | POA: Insufficient documentation

## 2016-03-02 DIAGNOSIS — Y939 Activity, unspecified: Secondary | ICD-10-CM | POA: Diagnosis not present

## 2016-03-02 MED ORDER — MUPIROCIN 2 % EX OINT: TOPICAL_OINTMENT | CUTANEOUS | 0 refills | Status: DC

## 2016-03-02 MED ORDER — CEPHALEXIN 500 MG PO CAPS: 500.0000 mg | ORAL_CAPSULE | Freq: Four times a day (QID) | ORAL | 0 refills | Status: DC

## 2016-03-02 MED ORDER — BACITRACIN ZINC 500 UNIT/GM EX OINT: TOPICAL_OINTMENT | Freq: Once | CUTANEOUS | Status: DC

## 2016-03-02 MED ORDER — CEPHALEXIN 250 MG PO CAPS
500.0000 mg | ORAL_CAPSULE | Freq: Once | ORAL | Status: AC
  Administered 2016-03-02: 500 mg via ORAL
  Filled 2016-03-02: qty 2

## 2016-03-02 NOTE — Discharge Instructions (Signed)
Please apply topical antibiotic to wound and change dressing twice a day.  Please take all of your antibiotics until finished!  You will need to follow up with your podiatrist or Dr. Sharol Given at the next available appointment, preferably later this week or early next week.  Return to ER if the wound becomes red, discharge or fever develops.

## 2016-03-02 NOTE — ED Notes (Signed)
Pt is in stable condition upon d/c and is escorted from ED.

## 2016-03-02 NOTE — ED Triage Notes (Signed)
Pt stumped toe on dressing after getting up out of bed. Pt has laceration to right second toe. Bleeding controlled with sterile dressing.

## 2016-03-02 NOTE — ED Provider Notes (Signed)
[facility] DEPT Provider Note   CSN: [id number] Arrival date & time: 03/02/16  1226     History   Chief Complaint Chief Complaint  Patient presents with  . Toe Injury    HPI MIR Kerry Waters is a 70 y.o. male.  The history is provided by the patient and medical records. No language interpreter was used.   Kerry Waters is a 70 y.o. male  with a PMH of DM, HTN, HLD, CAD, Prior osteomyelitis of the right great toe s/p amputation who presents to the Emergency Department complaining of laceration of the right second toe Waters occurred last night. Patient states Waters he got out of bed last night and stumped his toe on a dresser. The area started bleeding and his wife helped him control bleeding and clean the area. Patient endorses decreased sensation to bilateral lower extremities secondary to diabetic neuropathy. He denies any pain of the lower extremity. Did not fall - no head injury or LOC. He is followed by podiatry Dr. Prudence Davidson as well as Dr. Sharol Given of orthopedics. Tetanus up-to-date.  Past Medical History:  Diagnosis Date  . Basal cell carcinoma   . Carotid stenosis    a. Carotid US (04/2012):  bilateral ICA 40-59%  . Coronary artery disease    a. s/p CABG 1993;  b  Lexiscan 2015  . Diabetes mellitus    Dr. [name] follows.  [name] History of MI (myocardial infarction) 1987   ANTERIOR SEPTAL  . History of stroke    occipital in 04/2012 tx at [facility]  . Hyperlipidemia   . Hypertension   . Ischemic cardiomyopathy    a.  Echo (01/08/2013): Mild LVH, EF 25-30%, AS akinesis, Inf HK, Ant HK, Gr 1 DD, Ao sclerosis, no AS, mod MAC, mild LAE.;   b.  Echo (05/10/13): EF 35-40%, anteroseptal, anterior, anterolateral and apical HK.  . NSTEMI (non-ST elevated myocardial infarction) ([place])    a. 12/2012 in setting of sepsis syndrome - EF 25-30% on echo; ? Type 2 NSTEMI; b. f/u echo 04/2013 with EF 35-40%; Myoview with inf and AL infarct and peri-infarct ischemia, EF 38% => Med Rx recommended  .  Obesities, morbid ([place])   . Peripheral neuropathy ([place])   . PVD (peripheral vascular disease) ([place])   . Sepsis ([place]) 12/2012  . Sleep apnea    CPAP  . SVT (supraventricular tachycardia) ([place])    a. Holter monitor (04/2012): no atrial fibrillation; tachybradycardia syndrome with episodes of SVT and junctional brady - no indications for pacemaker at Waters time.  . Toe osteomyelitis, right ([place])    a. s/p R great toe amputation  . Venous stasis     Patient Active Problem List   Diagnosis Date Noted  . Ischemic cardiomyopathy 04/26/2013  . Tachy-brady syndrome ([place]) 04/26/2013  . Chronic systolic heart failure ([place]) 01/13/2013  . Cellulitis 01/07/2013  . Sepsis ([place]) 01/07/2013  . Elevated troponin 01/07/2013  . Hyponatremia 01/07/2013  . Hyperglycemia 01/07/2013  . Elevated brain natriuretic peptide (BNP) level 01/07/2013  . Elevated lactic acid level 01/07/2013  . Confusion 01/07/2013  . 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 Ulcer of leg, chronic, right ([name]) 05/09/2012  . Atherosclerosis of native arteries of the extremities with ulceration(440.23) 03/14/2012  . Carotid bruit 01/25/2011  . Obesity, morbid ([name])   . SVT (supraventricular tachycardia) ([name])   . Coronary atherosclerosis of native coronary artery 12/17/2007  . HYPERLIPIDEMIA 12/11/2007  . HYPERTENSION 12/11/2007  .  Obstructive sleep apnea 12/11/2007    Past Surgical History:  Procedure Laterality Date  . AMPUTATION Right 06/22/2013   Procedure: AMPUTATION DIGIT- right great toe;  Surgeon: Newt Minion, MD;  Location: [place] [facility];  Service: Orthopedics;  Laterality: Right;  Right Great Toe Amputation at MTP Joint  . CORONARY ARTERY BYPASS GRAFT  1993   LIMA to LAD with patch angioplasty, SVG to OM, SVG to OM & PD and patch angioplasy to PDA  . Left ankle ORIF  2000  . PILONIDAL CYST EXCISION  30 yrs. ago x2       Home Medications    Prior to Admission medications     Medication Sig Start Date End Date Taking? Authorizing Provider  aspirin EC 325 MG tablet Take 325 mg by mouth daily.   Yes Historical Provider, MD  atorvastatin (LIPITOR) 80 MG tablet Take 80 mg by mouth daily.   Yes Historical Provider, MD  Canagliflozin (INVOKANA) 100 MG TABS Take 100 mg by mouth daily.    Yes Historical Provider, MD  carvedilol (COREG) 25 MG tablet TAKE ONE TABLET BY MOUTH TWICE DAILY WITH MEALS Patient taking differently: Take 25 mg by mouth two times a day with meals 09/19/15  Yes Minus Breeding, MD  Coenzyme Q10 (COQ10 PO) Take 300 mg by mouth daily.   Yes Historical Provider, MD  furosemide (LASIX) 20 MG tablet Take 20 mg by mouth daily.    Yes Historical Provider, MD  HYDROcodone-acetaminophen (NORCO) 5-325 MG per tablet Take 1-2 tablets by mouth every 4 (four) hours as needed for moderate pain. 06/22/13  Yes Phillips Hay, PA-C  insulin NPH-regular Human (NOVOLIN 70/30) (70-30) 100 UNIT/ML injection Inject 45-50 Units into the skin See admin instructions. [place] WITH MEALS   Yes Historical Provider, MD  lisinopril (PRINIVIL,ZESTRIL) 20 MG tablet TAKE ONE TABLET BY MOUTH ONCE DAILY Patient taking differently: Take 20 mg by mouth twoi times a day 09/19/15  Yes Minus Breeding, MD  metFORMIN (GLUCOPHAGE) 500 MG tablet Take 500 mg by mouth every morning.    Yes Historical Provider, MD  Multiple Vitamin (MULTIVITAMIN WITH MINERALS) TABS tablet Take 1 tablet by mouth daily.   Yes Historical Provider, MD  nitroGLYCERIN (NITROSTAT) 0.4 MG SL tablet Place 1 tablet (0.4 mg total) under the tongue every 5 (five) minutes as needed for chest pain. 06/26/13  Yes Minus Breeding, MD  OVER THE COUNTER MEDICATION Move Free joint supplement: Take 2 tablets by mouth in the evening   Yes Historical Provider, MD  sitaGLIPtin-metformin (JANUMET) 50-500 MG tablet Take 1 tablet by mouth daily. Reported on 09/24/2015   Yes Historical Provider, MD  cephALEXin  (KEFLEX) 500 MG capsule Take 1 capsule (500 mg total) by mouth 4 (four) times daily. 03/02/16   Ozella Almond Boyd [place], PA-C  mupirocin ointment (BACTROBAN) 2 % Apply to wound twice daily. 03/02/16   Ozella Almond Avrie Kedzierski, PA-C    Family History Family History  Problem Relation Age of Onset  . Heart attack Father   . Heart disease Father     before age 35  . Cancer Mother     stomach cancer  . Heart attack Maternal Grandfather   . Heart attack Paternal Grandfather   . Cancer Sister     breast  . Diabetes Son     Social History Social History  Substance Use Topics  . Smoking status: Former Smoker    Packs/day: 1.00    Years: 30.00  Types: Cigarettes    Quit date: 04/19/1985  . Smokeless tobacco: Never Used  . Alcohol use 0.0 oz/week     Comment: 3x a week     Allergies   Patient has no known allergies.   Review of Systems Review of Systems  Constitutional: Negative for fever.  HENT: Negative for congestion.   Eyes: Negative for visual disturbance.  Respiratory: Negative for shortness of breath.   Cardiovascular: Negative for chest pain and leg swelling.  Gastrointestinal: Negative for abdominal pain.  Musculoskeletal: Negative for arthralgias.  Skin: Positive for wound.  Allergic/Immunologic: Positive for immunocompromised state (DM).  Neurological: Negative for syncope, weakness and headaches.     Physical Exam Updated Vital Signs BP 114/64   Pulse 60   Temp 97.9 F (36.6 C) (Oral)   Resp 17   Ht 5\' 11"  (1.803 m)   Wt (!) 145.2 kg   SpO2 96%   BMI 44.63 kg/m   Physical Exam  Constitutional: He is oriented to person, place, and time. He appears well-developed and well-nourished. No distress.  HENT:  Head: Normocephalic and atraumatic.  Cardiovascular: Normal rate, regular rhythm and normal heart sounds.   No murmur heard. Pulmonary/Chest: Effort normal and breath sounds normal. No respiratory distress.  Musculoskeletal:  Right knee with full ROM, no  tenderness to palpation and ligaments intact.  Right 2nd toe with swelling- 1.5 cm laceration to plantar aspect of PIP joint. No tenderness to palpation although he has baseline decreased sensation 2/2 DM neuropathy.   Neurological: He is alert and oriented to person, place, and time.  Skin: Skin is warm and dry.  Psychiatric: He has a normal mood and affect.  Nursing note and vitals reviewed.    ED Treatments / Results  Labs (all labs ordered are listed, but only abnormal results are displayed) Labs Reviewed - No data to display  EKG  EKG Interpretation None       Radiology Dg Knee Complete 4 Views Right  Result Date: 03/02/2016 CLINICAL DATA:  Pain following fall 1 day prior EXAM: RIGHT KNEE - COMPLETE 4+ VIEW COMPARISON:  None. FINDINGS: Frontal, lateral common bile oblique views were obtained. There is no demonstrable fracture or dislocation. There is no appreciable joint effusion. There is extensive osteoarthritic change in all compartments with narrowing most marked medially. There is spurring in all compartments. There is extensive arterial vascular calcification. IMPRESSION: No acute fracture or joint effusion. Extensive osteoarthritic change, most marked medially. There is extensive atherosclerosis in the distal superficial femoral artery and popliteal artery regions. Electronically Signed   By: Lowella Grip III M.D.   On: 03/02/2016 14:18   Dg Foot Complete Right  Result Date: 03/02/2016 CLINICAL DATA:  Dorsal/medial right foot pain after a fall at home yesterday. Initial encounter. EXAM: RIGHT FOOT COMPLETE - 3+ VIEW COMPARISON:  Right foot radiographs 01/08/2013 and MRI 01/10/2013 FINDINGS: There has been interval amputation of the right great toe at the level of the MCP joint. There are fractures of the proximal and middle phalanges of the second toe on either side of the PIP joint with plantar dislocation of the joint. There are mixed interval erosive and osseous  productive/sclerotic changes involving the lateral base of the second toe proximal phalanx which may reflect interval osteomyelitis which has been treated. Soft tissue calcification is noted lateral to the fourth toe DIP joint, and there is mild calcification or small corticated osseous fragments adjacent to the head of the first metatarsal. A small plantar calcaneal  enthesophyte is noted. There is mild soft tissue swelling in the forefoot which has greatly diminished from the prior study. Vascular calcifications are noted. IMPRESSION: 1. Second toe PIP joint fracture dislocation. 2. Interval right great toe amputation. 3. New changes at base of the second toe proximal phalanx which may reflect interval treated/ chronic osteomyelitis. Electronically Signed   By: Logan Bores M.D.   On: 03/02/2016 14:29    Procedures Procedures (including critical care time)  Medications Ordered in ED Medications  bacitracin ointment (not administered)  cephALEXin (KEFLEX) capsule 500 mg (500 mg Oral Given 03/02/16 1842)     Initial Impression / Assessment and Plan / ED Course  I have reviewed the triage vital signs and the nursing notes.  Pertinent labs & imaging results Waters were available during my care of the patient were reviewed by me and considered in my medical decision making (see chart for details).  Clinical Course    Kerry Waters is a 70 y.o. male who presents to ED for right 2nd toe injury Waters occurred last night. Patient hit toe against dresser causing laceration and swelling. Denies pain, but very diminished sensation to bilateral LE's 2/2 DM neuropathy. X-ray reviewed with attending, Dr. Laverta Baltimore and shows acute fracture dislocation of the 2nd toe. On exam, there is laceration to the bottom aspect of the foot, but given amount of swelling, location and > 12 hours since wound occurred, doubt laceration repair would be beneficial. Would thoroughly cleaned in ED and patient started on Keflex.  Tetanus up-to-date per chart review. Patient is followed by both podiatry and orthopedist: discussed with patient and family at bedside Waters he will need to follow up with either one of these specialty services for discussion of today's fracture. BID dressing changes. Family and patient agree with home care instructions discussed as well as follow up care. Reasons to return to ER discussed and all questions answered.   Patient discussed with Dr. Laverta Baltimore who agrees with treatment plan.   Final Clinical Impressions(s) / ED Diagnoses   Final diagnoses:  Open displaced fracture of proximal phalanx of lesser toe of right foot, initial encounter    New Prescriptions Discharge Medication List as of 03/02/2016  6:56 PM    START taking these medications   Details  cephALEXin (KEFLEX) 500 MG capsule Take 1 capsule (500 mg total) by mouth 4 (four) times daily., Starting Tue 03/02/2016, Print    mupirocin ointment (BACTROBAN) 2 % Apply to wound twice daily., Print         [facility] [place], PA-C 03/02/16 [place], MD 03/03/16 [phone]

## 2016-03-08 ENCOUNTER — Ambulatory Visit (INDEPENDENT_AMBULATORY_CARE_PROVIDER_SITE_OTHER): Payer: Medicare Other | Admitting: Podiatry

## 2016-03-08 ENCOUNTER — Encounter: Payer: Self-pay | Admitting: Podiatry

## 2016-03-08 VITALS — BP 123/69 | HR 76 | Resp 18

## 2016-03-08 DIAGNOSIS — I6523 Occlusion and stenosis of bilateral carotid arteries: Secondary | ICD-10-CM | POA: Diagnosis not present

## 2016-03-08 DIAGNOSIS — S92511A Displaced fracture of proximal phalanx of right lesser toe(s), initial encounter for closed fracture: Secondary | ICD-10-CM | POA: Diagnosis not present

## 2016-03-08 MED ORDER — CEPHALEXIN 500 MG PO CAPS: 500.0000 mg | ORAL_CAPSULE | Freq: Four times a day (QID) | ORAL | 0 refills | Status: DC

## 2016-03-08 NOTE — Addendum Note (Signed)
Addended byDeidre Ala, Margueritte Guthridge L on: 03/08/2016 10:47 AM   Modules accepted: Orders

## 2016-03-08 NOTE — Addendum Note (Signed)
Addended byDeidre Ala, Korin Setzler L on: 03/08/2016 03:09 PM   Modules accepted: Orders

## 2016-03-08 NOTE — Progress Notes (Signed)
   Subjective:    Patient ID: HA KRENGEL, male    DOB: 10-13-1945, 70 y.o.   MRN: [id number]  HPI this patient presents to the office for An evaluation of his second toe right foot. He says he injured his second toe last Monday and went to the emergency room on Tuesday morning. He was diagnosed with a fracture and a dislocation of the second toe right foot by the doctor. He also has a laceration on the bottom of his second toe right foot which she was instructed to soak in soapy soaks in water and bandage and take antibiotics. He was then told by the emergency room to be seen in my office for an evaluation of the toe. He says that the toe is nonpainful, but this is due to the fact that he has diabetic neuropathy and a loss of LOPS.  The second toe on the right foot is swollen with ecchymosis noted on the dorsum of the second toe. He does admit that the purplish discoloration is worsening since the initial injury. He presents the office today for an evaluation and treatment of this condition    Review of Systems  All other systems reviewed and are negative.      Objective:   Physical Exam GENERAL APPEARANCE: Alert, conversant. Appropriately groomed. No acute distress.  VASCULAR: Pedal pulses are not  palpable at  [facility] and PT bilateral due to excessive swelling.  Capillary refill time is immediate to all digits,  Normal temperature gradient.  NEUROLOGIC: sensation is absent  to 5.07 monofilament at 5/5 sites bilateral.  Light touch is intact bilateral, Muscle strength normal.  MUSCULOSKELETAL: acceptable muscle strength, tone and stability bilateral.  Intrinsic muscluature intact bilateral.  Rectus appearance of foot and digits noted bilateral. Amputation right hallux. Red swollen second toe right foot with ecchymosis noted dorsally to toe.   DERMATOLOGIC: There is a transverse laceration which appears to be healing on the plantar aspect of the second toe right foot at the level of the PIPJ. No  redness, swelling or drainage noted          Assessment & Plan:  Fracture and dislocation second toe right foot.    ROV.  The previously taken x-rays were reviewed and did reveal a fracture as well as a dislocation at the PIPJ .  His second toe is swollen and red and even ecchymotic. No evidence of any infection is noted at the laceration on the plantar aspect second toe right. This laceration is bandaged with Neosporin and dry sterile dressing. Antibiotics will be called in and have him take it for another week. He was also dispensed a surgical shoe to ambulate with. He was instructed to call the office if the problem worsens. Otherwise, he should return to the office in 3 weeks for evaluation and treatment   Gardiner Barefoot DPM   Gardiner Barefoot DPM

## 2016-03-17 NOTE — Addendum Note (Signed)
Addended by: Lianne Cure A on: 03/17/2016 01:11 PM   Modules accepted: Orders

## 2016-03-29 ENCOUNTER — Ambulatory Visit (INDEPENDENT_AMBULATORY_CARE_PROVIDER_SITE_OTHER): Payer: Medicare Other | Admitting: Podiatry

## 2016-03-29 ENCOUNTER — Encounter: Payer: Self-pay | Admitting: Podiatry

## 2016-03-29 VITALS — BP 182/88 | HR 58 | Resp 18

## 2016-03-29 DIAGNOSIS — S92511D Displaced fracture of proximal phalanx of right lesser toe(s), subsequent encounter for fracture with routine healing: Secondary | ICD-10-CM

## 2016-03-29 DIAGNOSIS — I6523 Occlusion and stenosis of bilateral carotid arteries: Secondary | ICD-10-CM

## 2016-03-29 NOTE — Progress Notes (Signed)
   Subjective:    Patient ID: Kerry Waters, male    DOB: November 27, 1945, 70 y.o.   MRN: [id number]  Toe Pain     this patient presents to the office for An evaluation of his second toe right foot. He says that his right knee is more painful than his toe at this time. His second toe is thickened and malformed at this visit. He's been diagnosed with a dislocated and/or fracture of the PIPJ of the second toe right foot. The laceration on the plantar aspect of the second toe has healed with no evidence of any drainage, redness noted. He presents the office today for an evaluation and treatment of the second toe    Review of Systems  All other systems reviewed and are negative.      Objective:   Physical Exam GENERAL APPEARANCE: Alert, conversant. Appropriately groomed. No acute distress.  VASCULAR: Pedal pulses are not  palpable at  [facility] and PT bilateral due to excessive swelling.  Capillary refill time is immediate to all digits,  Normal temperature gradient.  NEUROLOGIC: sensation is absent  to 5.07 monofilament at 5/5 sites bilateral.  Light touch is intact bilateral, Muscle strength normal.  MUSCULOSKELETAL: acceptable muscle strength, tone and stability bilateral.  Intrinsic muscluature intact bilateral.  Rectus appearance of foot and digits noted bilateral. Amputation right hallux. Red swollen second toe right foot consistant with fracture. DERMATOLOGIC: There is a transverse laceration which  is healing on the plantar aspect of the second toe right foot at the level of the PIPJ. No redness, swelling or drainage noted          Assessment & Plan:  Fracture and dislocation second toe right foot.    ROV.  This patient says he is more concerned about his right knee pain than his toe. He says his he has been bandaging his toe due to the laceration as well as taking antibiotics. He is not having any pain or discomfort to the toe. At this juncture. He is wearing his regular footgear. This  patient was shown how to buddy tape his second and third toes of his right foot to help to decrease the swelling. No evidence of any infection on the dorsum of the right foot. No pain noted at the MPJ second right foot. Patient was continued told to continue with the buddy taping and to return to the office for reevaluation of the second toe at the time of his preventative foot care services. Patient was instructed to call the office if the problem occurs.   Gardiner Barefoot DPM

## 2016-05-03 ENCOUNTER — Ambulatory Visit: Payer: Medicare Other | Admitting: Podiatry

## 2016-05-24 ENCOUNTER — Ambulatory Visit: Payer: Medicare Other | Admitting: Podiatry

## 2016-06-02 ENCOUNTER — Ambulatory Visit (INDEPENDENT_AMBULATORY_CARE_PROVIDER_SITE_OTHER): Payer: Medicare Other | Admitting: Sports Medicine

## 2016-06-02 ENCOUNTER — Encounter: Payer: Self-pay | Admitting: Sports Medicine

## 2016-06-02 DIAGNOSIS — I739 Peripheral vascular disease, unspecified: Secondary | ICD-10-CM

## 2016-06-02 DIAGNOSIS — M79676 Pain in unspecified toe(s): Secondary | ICD-10-CM

## 2016-06-02 DIAGNOSIS — B351 Tinea unguium: Secondary | ICD-10-CM

## 2016-06-02 DIAGNOSIS — E1140 Type 2 diabetes mellitus with diabetic neuropathy, unspecified: Secondary | ICD-10-CM

## 2016-06-02 NOTE — Progress Notes (Signed)
Subjective: Kerry Waters is a 71 y.o. Diabetic  male patient seen today in office with complaint of painful thickened and elongated toenails; unable to trim. Patient has a history of fracture that he reports does not hurt at right 2nd toe and amputation 3 years ago secondary to chronic ulcer with osteomyelitis. Reports that he uses cane because his right knee hurts. Patient has no other pedal complaints at this time.   Patient Active Problem List   Diagnosis Date Noted  . Ischemic cardiomyopathy 04/26/2013  . Tachy-brady syndrome ([place]) 04/26/2013  . Chronic systolic heart failure ([place]) 01/13/2013  . Cellulitis 01/07/2013  . Sepsis ([place]) 01/07/2013  . Elevated troponin 01/07/2013  . Hyponatremia 01/07/2013  . Hyperglycemia 01/07/2013  . Elevated brain natriuretic peptide (BNP) level 01/07/2013  . Elevated lactic acid level 01/07/2013  . Confusion 01/07/2013  . 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 Ulcer of leg, chronic, right ([place]) 05/09/2012  . Atherosclerosis of native arteries of the extremities with ulceration(440.23) 03/14/2012  . Carotid bruit 01/25/2011  . Obesity, morbid ([place])   . SVT (supraventricular tachycardia) ([place])   . Coronary atherosclerosis of native coronary artery 12/17/2007  . HYPERLIPIDEMIA 12/11/2007  . HYPERTENSION 12/11/2007  . Obstructive sleep apnea 12/11/2007    Current Outpatient Prescriptions on File Prior to Visit  Medication Sig Dispense Refill  . aspirin EC 325 MG tablet Take 325 mg by mouth daily.    [name] atorvastatin (LIPITOR) 80 MG tablet Take 80 mg by mouth daily.    . Canagliflozin (INVOKANA) 100 MG TABS Take 100 mg by mouth daily.     . carvedilol (COREG) 25 MG tablet TAKE ONE TABLET BY MOUTH TWICE DAILY WITH MEALS (Patient taking differently: Take 25 mg by mouth two times a day with meals) 180 tablet 2  . cephALEXin (KEFLEX) 500 MG capsule Take 1 capsule (500 mg total) by mouth 4 (four) times  daily. 40 capsule 0  . Coenzyme Q10 (COQ10 PO) Take 300 mg by mouth daily.    . furosemide (LASIX) 20 MG tablet Take 20 mg by mouth daily.     [name] HYDROcodone-acetaminophen (NORCO) 5-325 MG per tablet Take 1-2 tablets by mouth every 4 (four) hours as needed for moderate pain. 40 tablet 0  . insulin NPH-regular Human (NOVOLIN 70/30) (70-30) 100 UNIT/ML injection Inject 45-50 Units into the skin See admin instructions. 50 UNITS EVERY MORNING AND 45 UNITS EVERY EVENING WITH MEALS    . lisinopril (PRINIVIL,ZESTRIL) 20 MG tablet TAKE ONE TABLET BY MOUTH ONCE DAILY (Patient taking differently: Take 20 mg by mouth twoi times a day) 90 tablet 2  . metFORMIN (GLUCOPHAGE) 500 MG tablet Take 500 mg by mouth every morning.     . Multiple Vitamin (MULTIVITAMIN WITH MINERALS) TABS tablet Take 1 tablet by mouth daily.    . mupirocin ointment (BACTROBAN) 2 % Apply to wound twice daily. 15 g 0  . nitroGLYCERIN (NITROSTAT) 0.4 MG SL tablet Place 1 tablet (0.4 mg total) under the tongue every 5 (five) minutes as needed for chest pain. 25 tablet 11  . OVER THE COUNTER MEDICATION Move Free joint supplement: Take 2 tablets by mouth in the evening    . sitaGLIPtin-metformin (JANUMET) 50-500 MG tablet Take 1 tablet by mouth daily. Reported on 09/24/2015     No current facility-administered medications on file prior to visit.     No Known Allergies  Objective: Physical Exam  General: Well developed, nourished, no acute  distress, awake, alert and oriented x 3  Vascular: Dorsalis pedis artery 1/4 bilateral, Posterior tibial artery 1/4 bilateral, skin temperature warm to warm proximal to distal bilateral lower extremities, + varicosities, scant pedal hair present bilateral.  Neurological: Gross sensation present via light touch bilateral. Protective and vibratory absent bilateral.   Dermatological: Skin is warm, dry, and supple bilateral, Nails 1-5 on left and 2-5 on right are tender, long, thick, and discolored with  mild subungal debris, no webspace macerations present bilateral, no open lesions present bilateral, + skin tag plantar left 3rd toe, no callus/corns/hyperkeratotic tissue present bilateral. No signs of infection bilateral.  Musculoskeletal: S/p Right hallux amputation. No pain with palpation to right 2nd toe. Muscular strength within normal limits without pain on range of motion. No pain with calf compression bilateral.  Assessment and Plan:  Problem List Items Addressed This Visit    None    Visit Diagnoses    Pain due to onychomycosis of toenail    -  Primary   PVD (peripheral vascular disease) ([place])       Type 2 diabetes mellitus with diabetic neuropathy, unspecified long term insulin use status ([place])           -Examined patient.  -Discussed treatment options for painful mycotic nails. -Mechanically debrided and reduced mycotic nails with sterile nail nipper and dremel nail file without incident. -Recommend good supportive shoes daily and monitor right 2nd toe clinically currently asymptomatic  -Encouraged daily foot inspection in the setting of diabetes   -Patient to return in 3 months for follow up evaluation or sooner if symptoms worsen.  Landis Martins, DPM

## 2016-06-08 ENCOUNTER — Other Ambulatory Visit: Payer: Self-pay | Admitting: Cardiology

## 2016-06-14 DIAGNOSIS — I10 Essential (primary) hypertension: Secondary | ICD-10-CM | POA: Diagnosis not present

## 2016-06-14 DIAGNOSIS — Z125 Encounter for screening for malignant neoplasm of prostate: Secondary | ICD-10-CM | POA: Diagnosis not present

## 2016-06-14 DIAGNOSIS — Z0000 Encounter for general adult medical examination without abnormal findings: Secondary | ICD-10-CM | POA: Diagnosis not present

## 2016-06-14 DIAGNOSIS — E1140 Type 2 diabetes mellitus with diabetic neuropathy, unspecified: Secondary | ICD-10-CM | POA: Diagnosis not present

## 2016-06-14 DIAGNOSIS — E784 Other hyperlipidemia: Secondary | ICD-10-CM | POA: Diagnosis not present

## 2016-06-14 DIAGNOSIS — R8299 Other abnormal findings in urine: Secondary | ICD-10-CM | POA: Diagnosis not present

## 2016-06-21 DIAGNOSIS — Z6841 Body Mass Index (BMI) 40.0 and over, adult: Secondary | ICD-10-CM | POA: Diagnosis not present

## 2016-06-21 DIAGNOSIS — Z0000 Encounter for general adult medical examination without abnormal findings: Secondary | ICD-10-CM | POA: Diagnosis not present

## 2016-06-21 DIAGNOSIS — I6529 Occlusion and stenosis of unspecified carotid artery: Secondary | ICD-10-CM | POA: Diagnosis not present

## 2016-06-21 DIAGNOSIS — I7389 Other specified peripheral vascular diseases: Secondary | ICD-10-CM | POA: Diagnosis not present

## 2016-06-21 DIAGNOSIS — D692 Other nonthrombocytopenic purpura: Secondary | ICD-10-CM | POA: Diagnosis not present

## 2016-06-21 DIAGNOSIS — E1151 Type 2 diabetes mellitus with diabetic peripheral angiopathy without gangrene: Secondary | ICD-10-CM | POA: Diagnosis not present

## 2016-06-21 DIAGNOSIS — I5022 Chronic systolic (congestive) heart failure: Secondary | ICD-10-CM | POA: Diagnosis not present

## 2016-06-21 DIAGNOSIS — Z1389 Encounter for screening for other disorder: Secondary | ICD-10-CM | POA: Diagnosis not present

## 2016-06-21 DIAGNOSIS — Z89411 Acquired absence of right great toe: Secondary | ICD-10-CM | POA: Diagnosis not present

## 2016-06-21 DIAGNOSIS — Z125 Encounter for screening for malignant neoplasm of prostate: Secondary | ICD-10-CM | POA: Diagnosis not present

## 2016-06-21 DIAGNOSIS — I495 Sick sinus syndrome: Secondary | ICD-10-CM | POA: Diagnosis not present

## 2016-08-31 DIAGNOSIS — H35363 Drusen (degenerative) of macula, bilateral: Secondary | ICD-10-CM | POA: Diagnosis not present

## 2016-08-31 DIAGNOSIS — E119 Type 2 diabetes mellitus without complications: Secondary | ICD-10-CM | POA: Diagnosis not present

## 2016-09-01 ENCOUNTER — Ambulatory Visit (INDEPENDENT_AMBULATORY_CARE_PROVIDER_SITE_OTHER): Payer: Medicare Other | Admitting: Sports Medicine

## 2016-09-01 ENCOUNTER — Encounter: Payer: Self-pay | Admitting: Sports Medicine

## 2016-09-01 DIAGNOSIS — M79676 Pain in unspecified toe(s): Secondary | ICD-10-CM | POA: Diagnosis not present

## 2016-09-01 DIAGNOSIS — B351 Tinea unguium: Secondary | ICD-10-CM | POA: Diagnosis not present

## 2016-09-01 DIAGNOSIS — E1140 Type 2 diabetes mellitus with diabetic neuropathy, unspecified: Secondary | ICD-10-CM

## 2016-09-01 DIAGNOSIS — I739 Peripheral vascular disease, unspecified: Secondary | ICD-10-CM

## 2016-09-01 NOTE — Progress Notes (Signed)
Subjective: Kerry Waters is a 71 y.o. Diabetic  male patient seen today in office with complaint of painful thickened and elongated toenails; unable to trim. States blood sugar a few days ago was 180mg /dl. Denies any new medical issues or changes with medications. Patient has no other pedal complaints at this time.   Patient Active Problem List   Diagnosis Date Noted  . Ischemic cardiomyopathy 04/26/2013  . Tachy-brady syndrome ([place]) 04/26/2013  . Chronic systolic heart failure ([place]) 01/13/2013  . Cellulitis 01/07/2013  . Sepsis ([place]) 01/07/2013  . Elevated troponin 01/07/2013  . Hyponatremia 01/07/2013  . Hyperglycemia 01/07/2013  . Elevated brain natriuretic peptide (BNP) level 01/07/2013  . Elevated lactic acid level 01/07/2013  . Confusion 01/07/2013  . 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 Ulcer of leg, chronic, right ([place]) 05/09/2012  . Atherosclerosis of native arteries of the extremities with ulceration(440.23) 03/14/2012  . Carotid bruit 01/25/2011  . Obesity, morbid ([place])   . SVT (supraventricular tachycardia) ([place])   . Coronary atherosclerosis of native coronary artery 12/17/2007  . HYPERLIPIDEMIA 12/11/2007  . HYPERTENSION 12/11/2007  . Obstructive sleep apnea 12/11/2007    Current Outpatient Prescriptions on File Prior to Visit  Medication Sig Dispense Refill  . aspirin EC 325 MG tablet Take 325 mg by mouth daily.    [name] atorvastatin (LIPITOR) 80 MG tablet Take 80 mg by mouth daily.    . Canagliflozin (INVOKANA) 100 MG TABS Take 100 mg by mouth daily.     . carvedilol (COREG) 25 MG tablet TAKE ONE TABLET BY MOUTH TWICE DAILY WITH MEALS 180 tablet 1  . cephALEXin (KEFLEX) 500 MG capsule Take 1 capsule (500 mg total) by mouth 4 (four) times daily. 40 capsule 0  . Coenzyme Q10 (COQ10 PO) Take 300 mg by mouth daily.    . furosemide (LASIX) 20 MG tablet Take 20 mg by mouth daily.     [name] HYDROcodone-acetaminophen (NORCO)  5-325 MG per tablet Take 1-2 tablets by mouth every 4 (four) hours as needed for moderate pain. 40 tablet 0  . insulin NPH-regular Human (NOVOLIN 70/30) (70-30) 100 UNIT/ML injection Inject 45-50 Units into the skin See admin instructions. 50 UNITS EVERY MORNING AND 45 UNITS EVERY EVENING WITH MEALS    . lisinopril (PRINIVIL,ZESTRIL) 20 MG tablet TAKE ONE TABLET BY MOUTH ONCE DAILY (Patient taking differently: Take 20 mg by mouth twoi times a day) 90 tablet 2  . metFORMIN (GLUCOPHAGE) 500 MG tablet Take 500 mg by mouth every morning.     . Multiple Vitamin (MULTIVITAMIN WITH MINERALS) TABS tablet Take 1 tablet by mouth daily.    . mupirocin ointment (BACTROBAN) 2 % Apply to wound twice daily. 15 g 0  . nitroGLYCERIN (NITROSTAT) 0.4 MG SL tablet Place 1 tablet (0.4 mg total) under the tongue every 5 (five) minutes as needed for chest pain. 25 tablet 11  . OVER THE COUNTER MEDICATION Move Free joint supplement: Take 2 tablets by mouth in the evening    . sitaGLIPtin-metformin (JANUMET) 50-500 MG tablet Take 1 tablet by mouth daily. Reported on 09/24/2015     No current facility-administered medications on file prior to visit.     No Known Allergies  Objective: Physical Exam  General: Well developed, nourished, no acute distress, awake, alert and oriented x 3  Vascular: Dorsalis pedis artery 1/4 bilateral, Posterior tibial artery 1/4 bilateral, skin temperature warm to warm proximal to distal bilateral lower extremities, + varicosities, scant  pedal hair present bilateral.  Neurological: Gross sensation present via light touch bilateral. Protective and vibratory absent bilateral.   Dermatological: Skin is warm, dry, and supple bilateral, Nails 1-5 on left and 2-5 on right are tender, long, thick, and discolored with mild subungal debris, no webspace macerations present bilateral, no open lesions present bilateral, + abrasion to right 4th toe with no signs of infection, + skin tag plantar left 3rd  toe, no callus/corns/hyperkeratotic tissue present bilateral. No signs of infection bilateral.  Musculoskeletal: S/p Right hallux amputation. No pain with palpation to right 2nd toe. Muscular strength within normal limits without pain on range of motion. No pain with calf compression bilateral.  Assessment and Plan:  Problem List Items Addressed This Visit    None    Visit Diagnoses    Pain due to onychomycosis of toenail    -  Primary   PVD (peripheral vascular disease) ([place])       Type 2 diabetes mellitus with diabetic neuropathy, unspecified whether long term insulin use ([place])           -Examined patient.  -Discussed treatment options for painful mycotic nails. -Mechanically debrided and reduced mycotic nails with sterile nail nipper and dremel nail file without incident. -Encouraged daily foot inspection in the setting of diabetes   -Patient to return in 3 months for follow up evaluation or sooner if symptoms worsen.  Landis Martins, DPM

## 2016-09-28 NOTE — Progress Notes (Signed)
HPI The patient presents as a follow up of CAD. He has an ischemic cardiomyopathy.  His last ejection fraction was about 35 - 40%. This was actually slightly higher than previous. He's had a follow-up stress perfusion study with inferolateral infarct with some mild peri-infarct ischemia. He was managed medically.  This was in 2015.  He has had stroke. He was initially on aspirin and Plavix but had some peri-infarct hemorrhagic conversion with this and so is on aspirin only.  He returns for follow up.  Since I last saw him he's been limited by knee pain. He gets around slowly with a cane. He uses about it if he has to go any distance. He's not limited by chest pain or shortness of breath. He's not having any new palpitations, presyncope or syncope. He uses CPAP at night and denies any PND or orthopnea. He's probably had a little weight loss and no weight gain. He says his sugars and his cholesterol are slightly better.    No Known Allergies  Current Outpatient Prescriptions  Medication Sig Dispense Refill  . aspirin EC 325 MG tablet Take 325 mg by mouth daily.    [name] atorvastatin (LIPITOR) 80 MG tablet Take 80 mg by mouth daily.    . Canagliflozin (INVOKANA) 100 MG TABS Take 100 mg by mouth daily.     . carvedilol (COREG) 25 MG tablet TAKE ONE TABLET BY MOUTH TWICE DAILY WITH MEALS 180 tablet 1  . Coenzyme Q10 (COQ10 PO) Take 300 mg by mouth daily.    . furosemide (LASIX) 20 MG tablet Take 20 mg by mouth daily.     . Glucosamine-Chondroit-Vit C-Mn (GLUCOSAMINE 1500 COMPLEX PO) Take 1,500 mg by mouth daily.    [name] HYDROcodone-acetaminophen (NORCO) 5-325 MG per tablet Take 1-2 tablets by mouth every 4 (four) hours as needed for moderate pain. 40 tablet 0  . insulin NPH-regular Human (NOVOLIN 70/30) (70-30) 100 UNIT/ML injection Inject 50 Units into the skin See admin instructions. 50 UNITS EVERY MORNING AND 45 UNITS EVERY EVENING WITH MEALS    . lisinopril (PRINIVIL,ZESTRIL) 20 MG tablet TAKE ONE  TABLET BY MOUTH ONCE DAILY (Patient taking differently: Take 20 mg by mouth twoi times a day) 90 tablet 2  . metFORMIN (GLUCOPHAGE) 500 MG tablet Take 1,000 mg by mouth 2 (two) times daily with a meal.     . Multiple Vitamin (MULTIVITAMIN WITH MINERALS) TABS tablet Take 1 tablet by mouth daily.    . Multiple Vitamins-Minerals (PRESERVISION AREDS 2 PO) Take 1 tablet by mouth 2 (two) times daily.    . nitroGLYCERIN (NITROSTAT) 0.4 MG SL tablet Place 1 tablet (0.4 mg total) under the tongue every 5 (five) minutes as needed for chest pain. 25 tablet 11  . sitaGLIPtin (JANUVIA) 100 MG tablet Take 100 mg by mouth daily.    . sitaGLIPtin-metformin (JANUMET) 50-500 MG tablet Take 1 tablet by mouth daily. Reported on 09/24/2015     No current facility-administered medications for this visit.     Past Medical History:  Diagnosis Date  . Basal cell carcinoma   . Carotid stenosis    a. Carotid US (04/2012):  bilateral ICA 40-59%  . Coronary artery disease    a. s/p CABG 1993;  b  Lexiscan 2015  . Diabetes mellitus    Dr. [name] follows.  [name] History of MI (myocardial infarction) 1987   ANTERIOR SEPTAL  . History of stroke    occipital in 04/2012 tx at [facility]  .  Hyperlipidemia   . Hypertension   . Ischemic cardiomyopathy    a.  Echo (01/08/2013): Mild LVH, EF 25-30%, AS akinesis, Inf HK, Ant HK, Gr 1 DD, Ao sclerosis, no AS, mod MAC, mild LAE.;   b.  Echo (05/10/13): EF 35-40%, anteroseptal, anterior, anterolateral and apical HK.  . NSTEMI (non-ST elevated myocardial infarction) ([place])    a. 12/2012 in setting of sepsis syndrome - EF 25-30% on echo; ? Type 2 NSTEMI; b. f/u echo 04/2013 with EF 35-40%; Myoview with inf and AL infarct and peri-infarct ischemia, EF 38% => Med Rx recommended  . Obesities, morbid ([place])   . Peripheral neuropathy   . PVD (peripheral vascular disease) ([place])   . Sepsis ([place]) 12/2012  . Sleep apnea    CPAP  . SVT (supraventricular tachycardia) ([place])    a. Holter monitor (04/2012): no  atrial fibrillation; tachybradycardia syndrome with episodes of SVT and junctional brady - no indications for pacemaker at that time.  . Toe osteomyelitis, right ([place])    a. s/p R great toe amputation  . Venous stasis     Past Surgical History:  Procedure Laterality Date  . AMPUTATION Right 06/22/2013   Procedure: AMPUTATION DIGIT- right great toe;  Surgeon: Newt Minion, MD;  Location: [facility];  Service: Orthopedics;  Laterality: Right;  Right Great Toe Amputation at [facility] Joint  . CORONARY ARTERY BYPASS GRAFT  1993   LIMA to LAD with patch angioplasty, SVG to OM, SVG to OM & PD and patch angioplasy to PDA  . Left ankle ORIF  2000  . PILONIDAL CYST EXCISION  30 yrs. ago x2    ROS:  Knee pain. .  Otherwise  stated in the HPI and negative for all other systems.  PHYSICAL EXAM BP 136/70   Pulse 60   Ht 5' 11"  (1.803 m)   Wt (!) 327 lb (148.3 kg)   BMI 45.61 kg/m   GENERAL:  Well appearing NECK:  No jugular venous distention, waveform within normal limits, carotid upstroke brisk and symmetric, bilateral carotid bruits, no thyromegaly LUNGS:  Clear to auscultation bilaterally CHEST:  Well healed sternotomy scar. HEART:  PMI not displaced or sustained,S1 and S2 within normal limits, no S3, no S4, no clicks, no rubs, 2 out of 6 apical systolic murmur radiating slightly to the aortic outflow tract, murmurs ABD:  Flat, positive bowel sounds normal in frequency in pitch, no bruits, no rebound, no guarding, no midline pulsatile mass, no hepatomegaly, no splenomegaly EXT:  2 plus pulses upper and decreased DP/PT bilateral, mild right greater than left leg edema, no cyanosis no clubbing   EKG:  Normal sinus rhythm, rate 60, left axis deviation, nonspecific T-wave flattening, no acute ST-T wave changes. Premature ectopic complexes. 09/30/2016    ASSESSMENT AND PLAN  CVA He has no change from previous. He chooses to be on high-dose aspirin and we talked about this. He remains off Plavix since he  had some bleeding on this. No change in therapy is planned.  HYPERTENSION -  The blood pressure is well-controlled. He'll continue with the meds as listed.   Obesities, morbid -  The patient understands the need to lose weight with. We have discussed specific strategies for this.  CAD The patient has  no new symptoms since stress testing in 2015.  No further testing is indicated.   DM His A1C was 8.0 by his report. This was better than previous. This is followed by Dr. [name].  CM His ejection  fraction has been moderately reduced. I like to follow-up with another echocardiogram.

## 2016-09-30 ENCOUNTER — Ambulatory Visit (INDEPENDENT_AMBULATORY_CARE_PROVIDER_SITE_OTHER): Payer: Medicare Other | Admitting: Cardiology

## 2016-09-30 ENCOUNTER — Encounter: Payer: Self-pay | Admitting: Cardiology

## 2016-09-30 VITALS — BP 136/70 | HR 60 | Ht 71.0 in | Wt 327.0 lb

## 2016-09-30 DIAGNOSIS — I10 Essential (primary) hypertension: Secondary | ICD-10-CM | POA: Diagnosis not present

## 2016-09-30 DIAGNOSIS — I2510 Atherosclerotic heart disease of native coronary artery without angina pectoris: Secondary | ICD-10-CM | POA: Diagnosis not present

## 2016-09-30 DIAGNOSIS — I255 Ischemic cardiomyopathy: Secondary | ICD-10-CM | POA: Diagnosis not present

## 2016-09-30 NOTE — Patient Instructions (Signed)

## 2016-10-13 ENCOUNTER — Ambulatory Visit (HOSPITAL_COMMUNITY): Payer: Medicare Other | Attending: Cardiovascular Disease

## 2016-10-13 DIAGNOSIS — I351 Nonrheumatic aortic (valve) insufficiency: Secondary | ICD-10-CM | POA: Diagnosis not present

## 2016-10-13 DIAGNOSIS — I255 Ischemic cardiomyopathy: Secondary | ICD-10-CM

## 2016-10-13 MED ORDER — PERFLUTREN LIPID MICROSPHERE
1.0000 mL | INTRAVENOUS | Status: AC | PRN
  Administered 2016-10-13: 3 mL via INTRAVENOUS

## 2016-12-01 ENCOUNTER — Ambulatory Visit: Payer: Medicare Other | Admitting: Sports Medicine

## 2016-12-06 ENCOUNTER — Other Ambulatory Visit: Payer: Self-pay | Admitting: Cardiology

## 2016-12-13 ENCOUNTER — Ambulatory Visit (INDEPENDENT_AMBULATORY_CARE_PROVIDER_SITE_OTHER): Payer: Medicare Other | Admitting: Podiatry

## 2016-12-13 ENCOUNTER — Encounter: Payer: Self-pay | Admitting: Podiatry

## 2016-12-13 DIAGNOSIS — E1142 Type 2 diabetes mellitus with diabetic polyneuropathy: Secondary | ICD-10-CM

## 2016-12-13 DIAGNOSIS — Z9889 Other specified postprocedural states: Secondary | ICD-10-CM | POA: Diagnosis not present

## 2016-12-13 DIAGNOSIS — Z89411 Acquired absence of right great toe: Secondary | ICD-10-CM

## 2016-12-13 NOTE — Progress Notes (Signed)
Subjective:  Patient ID: Kerry Waters, male    DOB: 1946-01-25,  MRN: [id number] HPI Chief Complaint  Patient presents with  . Diabetes    Diabetic foot and nail care     71 y.o. male presents with the above complaint. Reports elongated nails to both feet. Didn't check his sugar this AM - states he doesn't check them regularly. States his sugars usually run about 150 mg/dl. Endorses numbness and tingling in his feet. Reports R great toe amputation several years ago 2/2 infection.  Past Medical History:  Diagnosis Date  . Basal cell carcinoma   . Carotid stenosis    a. Carotid US (04/2012):  bilateral ICA 40-59%  . Coronary artery disease    a. s/p CABG 1993;  b  Lexiscan 2015  . Diabetes mellitus    Dr. [name] follows.  [name] History of MI (myocardial infarction) 1987   ANTERIOR SEPTAL  . History of stroke    occipital in 04/2012 tx at [facility]  . Hyperlipidemia   . Hypertension   . Ischemic cardiomyopathy    a.  Echo (01/08/2013): Mild LVH, EF 25-30%, AS akinesis, Inf HK, Ant HK, Gr 1 DD, Ao sclerosis, no AS, mod MAC, mild LAE.;   b.  Echo (05/10/13): EF 35-40%, anteroseptal, anterior, anterolateral and apical HK.  . NSTEMI (non-ST elevated myocardial infarction) ([place])    a. 12/2012 in setting of sepsis syndrome - EF 25-30% on echo; ? Type 2 NSTEMI; b. f/u echo 04/2013 with EF 35-40%; Myoview with inf and AL infarct and peri-infarct ischemia, EF 38% => Med Rx recommended  . Obesities, morbid ([place])   . Peripheral neuropathy   . PVD (peripheral vascular disease) ([place])   . Sepsis ([place]) 12/2012  . Sleep apnea    CPAP  . SVT (supraventricular tachycardia) ([place])    a. 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  . Toe osteomyelitis, right ([place])    a. s/p R great toe amputation  . Venous stasis    Past Surgical History:  Procedure Laterality Date  . AMPUTATION Right  06/22/2013   Procedure: AMPUTATION DIGIT- right great toe;  Surgeon: Newt Minion, MD;  Location: [place];  Service: Orthopedics;  Laterality: Right;  Right Great Toe Amputation at MTP Joint  . CORONARY ARTERY BYPASS GRAFT  1993   LIMA to LAD with patch angioplasty, SVG to OM, SVG to OM & PD and patch angioplasy to PDA  . Left ankle ORIF  2000  . PILONIDAL CYST EXCISION  30 yrs. ago x2    Current Outpatient Prescriptions:  .  aspirin EC 325 MG tablet, Take 325 mg by mouth daily., Disp: , Rfl:  .  atorvastatin (LIPITOR) 80 MG tablet, Take 80 mg by mouth daily., Disp: , Rfl:  .  Canagliflozin (INVOKANA) 100 MG TABS, Take 100 mg by mouth daily. , Disp: , Rfl:  .  carvedilol (COREG) 25 MG tablet, TAKE 1 TABLET BY MOUTH TWICE DAILY WITH MEALS, Disp: 180 tablet, Rfl: 3 .  Coenzyme Q10 (COQ10 PO), Take 300 mg by mouth daily., Disp: , Rfl:  .  furosemide (LASIX) 20 MG tablet, Take 20 mg by mouth daily. , Disp: , Rfl:  .  Glucosamine-Chondroit-Vit C-Mn (GLUCOSAMINE 1500 COMPLEX PO), Take 1,500 mg by mouth daily., Disp: , Rfl:  .  HYDROcodone-acetaminophen (NORCO) 5-325 MG per tablet, Take 1-2 tablets by mouth every 4 (four)  hours as needed for moderate pain., Disp: 40 tablet, Rfl: 0 .  insulin NPH-regular Human (NOVOLIN 70/30) (70-30) 100 UNIT/ML injection, Inject 50 Units into the skin See admin instructions. 50 UNITS EVERY MORNING AND 45 UNITS EVERY EVENING WITH MEALS, Disp: , Rfl:  .  lisinopril (PRINIVIL,ZESTRIL) 20 MG tablet, TAKE ONE TABLET BY MOUTH ONCE DAILY (Patient taking differently: Take 20 mg by mouth twoi times a day), Disp: 90 tablet, Rfl: 2 .  metFORMIN (GLUCOPHAGE) 500 MG tablet, Take 1,000 mg by mouth 2 (two) times daily with a meal. , Disp: , Rfl:  .  Multiple Vitamin (MULTIVITAMIN WITH MINERALS) TABS tablet, Take 1 tablet by mouth daily., Disp: , Rfl:  .  Multiple Vitamins-Minerals (PRESERVISION AREDS 2 PO), Take 1 tablet by mouth 2 (two) times daily., Disp: , Rfl:  .  nitroGLYCERIN  (NITROSTAT) 0.4 MG SL tablet, Place 1 tablet (0.4 mg total) under the tongue every 5 (five) minutes as needed for chest pain., Disp: 25 tablet, Rfl: 11 .  sitaGLIPtin (JANUVIA) 100 MG tablet, Take 100 mg by mouth daily., Disp: , Rfl:  .  sitaGLIPtin-metformin (JANUMET) 50-500 MG tablet, Take 1 tablet by mouth daily. Reported on 09/24/2015, Disp: , Rfl:   No Known Allergies Review of Systems Objective:  There were no vitals filed for this visit. General AA&O x3. Normal mood and affect.  Vascular Dorsalis pedis and posterior tibial pulses  present 2+ bilaterally  Capillary refill normal to all digits. Pedal hair growth normal.  Neurologic Epicritic sensation absent bilaterally. Protective sensation with 5.07 monofilament  absent bilaterally.  Dermatologic No open lesions. Interspaces clear of maceration.  Normal skin temperature and turgor. Hyperkeratotic lesions: none bilaterally. Nails: brittle, thickening  Orthopedic: R great toe amputation. MMT 5/5 in dorsiflexion, plantarflexion, inversion, and eversion. Normal lower extremity joint ROM without pain or crepitus.      Assessment & Plan:  Patient was evaluated and treated and all questions answered.  Diabetes with Diabetic Peripheral Neuropathy -Educated on diabetic footcare. Diabetic risk level 4. -Nails x9 debrided sharply and manually with large nail nipper and rotary burr. -Nails debrided. See procedure note below.  Procedure: Nail Debridement Rationale: Patient meets criteria for routine foot care due to Class A findings - non-traumatic amputation Type of Debridement: manual, sharp debridement. Instrumentation: Nail nipper Number of Nails: 9  Return in about 3 months (around 03/15/2017).

## 2016-12-16 ENCOUNTER — Ambulatory Visit: Payer: Medicare Other | Admitting: Sports Medicine

## 2016-12-27 DIAGNOSIS — Z89411 Acquired absence of right great toe: Secondary | ICD-10-CM | POA: Diagnosis not present

## 2016-12-27 DIAGNOSIS — Z6841 Body Mass Index (BMI) 40.0 and over, adult: Secondary | ICD-10-CM | POA: Diagnosis not present

## 2016-12-27 DIAGNOSIS — D692 Other nonthrombocytopenic purpura: Secondary | ICD-10-CM | POA: Diagnosis not present

## 2016-12-27 DIAGNOSIS — I6529 Occlusion and stenosis of unspecified carotid artery: Secondary | ICD-10-CM | POA: Diagnosis not present

## 2016-12-27 DIAGNOSIS — R2689 Other abnormalities of gait and mobility: Secondary | ICD-10-CM | POA: Diagnosis not present

## 2016-12-27 DIAGNOSIS — I130 Hypertensive heart and chronic kidney disease with heart failure and stage 1 through stage 4 chronic kidney disease, or unspecified chronic kidney disease: Secondary | ICD-10-CM | POA: Diagnosis not present

## 2016-12-27 DIAGNOSIS — Z794 Long term (current) use of insulin: Secondary | ICD-10-CM | POA: Diagnosis not present

## 2016-12-27 DIAGNOSIS — E1151 Type 2 diabetes mellitus with diabetic peripheral angiopathy without gangrene: Secondary | ICD-10-CM | POA: Diagnosis not present

## 2016-12-27 DIAGNOSIS — Z23 Encounter for immunization: Secondary | ICD-10-CM | POA: Diagnosis not present

## 2016-12-27 DIAGNOSIS — I6990 Unspecified sequelae of unspecified cerebrovascular disease: Secondary | ICD-10-CM | POA: Diagnosis not present

## 2016-12-27 DIAGNOSIS — I5022 Chronic systolic (congestive) heart failure: Secondary | ICD-10-CM | POA: Diagnosis not present

## 2017-01-13 ENCOUNTER — Telehealth: Payer: Self-pay | Admitting: Internal Medicine

## 2017-01-13 NOTE — Telephone Encounter (Signed)
Pt has been scheduled for 1 year f/u with CY on 01/17/17. Pt is aware and voiced his understanding. Nothing further needed.

## 2017-01-17 ENCOUNTER — Ambulatory Visit (INDEPENDENT_AMBULATORY_CARE_PROVIDER_SITE_OTHER): Payer: Medicare Other | Admitting: Internal Medicine

## 2017-01-17 ENCOUNTER — Encounter: Payer: Self-pay | Admitting: Internal Medicine

## 2017-01-17 VITALS — BP 138/80 | HR 79 | Ht 71.0 in | Wt 342.0 lb

## 2017-01-17 DIAGNOSIS — G4733 Obstructive sleep apnea (adult) (pediatric): Secondary | ICD-10-CM | POA: Diagnosis not present

## 2017-01-17 DIAGNOSIS — I255 Ischemic cardiomyopathy: Secondary | ICD-10-CM

## 2017-01-17 NOTE — Progress Notes (Signed)
Subjective:    Patient ID: Kerry Waters, male    DOB: 04/16/1946, 71 y.o.   MRN: [id number]  HPI  male former smoker followed for obstructive sleep apnea complicated by obesity, HBP CAD/MI/ hx SVT, ischemic CM, DM2, morbid obesity  ------------------------------------------------------------------------  01/23/2016-71 year old male former smoker followed for OSA, complicated by obesity, HBP, CAD/MI, CABG, history SVT, ischemic CM, DM 2 CPAP 19/[facility] FOLLOWS FOR: DME: [facility], Pt wears CPAP nightly for at least 8 hours. DL attached. Weight today 324 lbs Download confirms excellent compliance 98%/4 hours and excellent control AHI 3.1/hour. He is comfortable with the pressure but asks if his machine is worn out. He has learned to ignore a high pitched whistle and can't tell where it's coming from. Can't sleep without CPAP.  01/17/17-  71 year old male former smoker followed for OSA, complicated by obesity, HBP, CAD/MI, CABG, history SVT, ischemic CM, DM 2, morbid obesity CPAP 19/[facility] FOLLOWS FOR: [facility]. Pt wears CPAP nightly and DL attached. Pt is due for new CPAP per DME. Will need to order today.  He has not been able to keep his weight down. With weight gain he notices more shortness of breath without cough or wheeze. He also has symptoms seasonal spring and fall nasal congestion. CPAP download 100% compliance averaging 10-1/2 hours per night with AHI 19.1-mostly centrals.  Review of Systems- HPI + = positive Constitutional:   No-   weight loss, night sweats, fevers, chills, fatigue, lassitude. HEENT:   No-  headaches, difficulty swallowing, tooth/dental problems, sore throat,       No-  sneezing, itching, ear ache, nasal congestion, post nasal drip,  CV:  No-   chest pain, orthopnea, PND, swelling in lower extremities, anasarca, dizziness, palpitations Resp: + shortness of breath with exertion or at rest.              No-    productive cough,  No non-productive cough,  No-  coughing up of blood.              No-   change in color of mucus.  No- wheezing.   Skin: No-   rash or lesions. GI:  No-   heartburn, indigestion, abdominal pain, nausea, vomiting,  GU:  MS:  No-   joint pain or swelling.  + Diabetic foot ulcers Neuro- Psych:  No- change in mood or affect. No depression or anxiety.  No memory loss. Objective:   Physical Exam General- Alert, Oriented, Affect-appropriate, Distress- none acute   +Morbidly obese Skin- cleatr Lymphadenopathy- none Head- atraumatic            Eyes- Gross vision intact, PERRLA, conjunctivae clear secretions            Ears- Hearing, canals - normal             Nose-  No- Septal dev, mucus, polyps, erosion, perforation             Throat- Mallampati IV , mucosa clear , drainage- none, tonsils- atrophic   Neck- flexible , trachea midline, no stridor , thyroid nl, carotid no bruit Chest - symmetrical excursion , unlabored           Heart/CV- RRR occasional skipped beat ,  Murmur+1/LUSB , no gallop  , no rub, nl s1 s2                           - JVD-  none , edema- 2+/ support hose, stasis changes- none, varices- none           Lung- clear to P&A, wheeze- none, cough- none , dullness-none, rub- none           Chest wall-  Abd-  Br/ Gen/ Rectal- Not done, not indicated Extrem- cyanosis- none, clubbing, none, atrophy- none, strength- nl , cane  Neuro- grossly intact to observation    Assessment & Plan:

## 2017-01-17 NOTE — Patient Instructions (Signed)
Order- DME [facility]  Please replace old CPAP machine, change to auto 5-20, continue mask of choice, humidifier, supplies, AirView        Dx OSA  Please call as needed

## 2017-01-18 ENCOUNTER — Encounter: Payer: Self-pay | Admitting: Family

## 2017-01-18 ENCOUNTER — Ambulatory Visit (INDEPENDENT_AMBULATORY_CARE_PROVIDER_SITE_OTHER): Payer: Medicare Other | Admitting: Family

## 2017-01-18 ENCOUNTER — Ambulatory Visit (HOSPITAL_COMMUNITY)
Admission: RE | Admit: 2017-01-18 | Discharge: 2017-01-18 | Disposition: A | Discharge status: Home / Self Care | Payer: Medicare Other | Source: Ambulatory Visit | Attending: Vascular Surgery | Admitting: Vascular Surgery

## 2017-01-18 VITALS — BP 170/88 | HR 68 | Temp 97.1°F | Resp 20 | Ht 71.0 in | Wt 342.0 lb

## 2017-01-18 DIAGNOSIS — I255 Ischemic cardiomyopathy: Secondary | ICD-10-CM | POA: Diagnosis not present

## 2017-01-18 DIAGNOSIS — I6521 Occlusion and stenosis of right carotid artery: Secondary | ICD-10-CM

## 2017-01-18 DIAGNOSIS — I872 Venous insufficiency (chronic) (peripheral): Secondary | ICD-10-CM | POA: Diagnosis not present

## 2017-01-18 DIAGNOSIS — I6523 Occlusion and stenosis of bilateral carotid arteries: Secondary | ICD-10-CM | POA: Diagnosis not present

## 2017-01-18 LAB — VAS US CAROTID
LCCADDIAS: -16 cm/s
LCCAPDIAS: 16 cm/s
LCCAPSYS: 105 cm/s
LEFT ECA DIAS: -11 cm/s
LEFT VERTEBRAL DIAS: -7 cm/s
LICADDIAS: -19 cm/s
LICAPDIAS: -38 cm/s
Left CCA dist sys: -61 cm/s
Left ICA dist sys: -108 cm/s
Left ICA prox sys: -108 cm/s
RCCAPSYS: 54 cm/s
RIGHT CCA MID DIAS: 9 cm/s
RIGHT ECA DIAS: -7 cm/s
RIGHT VERTEBRAL DIAS: -10 cm/s
Right CCA prox dias: 14 cm/s
Right cca dist sys: -85 cm/s

## 2017-01-18 NOTE — Patient Instructions (Addendum)
Stroke Prevention Some medical conditions and behaviors are associated with an increased chance of having a stroke. You may prevent a stroke by making healthy choices and managing medical conditions. How can I reduce my risk of having a stroke?  Stay physically active. Get at least 30 minutes of activity on most or all days.  Do not smoke. It may also be helpful to avoid exposure to secondhand smoke.  Limit alcohol use. Moderate alcohol use is considered to be: ? No more than 2 drinks per day for men. ? No more than 1 drink per day for nonpregnant women.  Eat healthy foods. This involves: ? Eating 5 or more servings of fruits and vegetables a day. ? Making dietary changes that address high blood pressure (hypertension), high cholesterol, diabetes, or obesity.  Manage your cholesterol levels. ? Making food choices that are high in fiber and low in saturated fat, trans fat, and cholesterol may control cholesterol levels. ? Take any prescribed medicines to control cholesterol as directed by your health care provider.  Manage your diabetes. ? Controlling your carbohydrate and sugar intake is recommended to manage diabetes. ? Take any prescribed medicines to control diabetes as directed by your health care provider.  Control your hypertension. ? Making food choices that are low in salt (sodium), saturated fat, trans fat, and cholesterol is recommended to manage hypertension. ? Ask your health care provider if you need treatment to lower your blood pressure. Take any prescribed medicines to control hypertension as directed by your health care provider. ? If you are 18-39 years of age, have your blood pressure checked every 3-5 years. If you are 40 years of age or older, have your blood pressure checked every year.  Maintain a healthy weight. ? Reducing calorie intake and making food choices that are low in sodium, saturated fat, trans fat, and cholesterol are recommended to manage  weight.  Stop drug abuse.  Avoid taking birth control pills. ? Talk to your health care provider about the risks of taking birth control pills if you are over 35 years old, smoke, get migraines, or have ever had a blood clot.  Get evaluated for sleep disorders (sleep apnea). ? Talk to your health care provider about getting a sleep evaluation if you snore a lot or have excessive sleepiness.  Take medicines only as directed by your health care provider. ? For some people, aspirin or blood thinners (anticoagulants) are helpful in reducing the risk of forming abnormal blood clots that can lead to stroke. If you have the irregular heart rhythm of atrial fibrillation, you should be on a blood thinner unless there is a good reason you cannot take them. ? Understand all your medicine instructions.  Make sure that other conditions (such as anemia or atherosclerosis) are addressed. Get help right away if:  You have sudden weakness or numbness of the face, arm, or leg, especially on one side of the body.  Your face or eyelid droops to one side.  You have sudden confusion.  You have trouble speaking (aphasia) or understanding.  You have sudden trouble seeing in one or both eyes.  You have sudden trouble walking.  You have dizziness.  You have a loss of balance or coordination.  You have a sudden, severe headache with no known cause.  You have new chest pain or an irregular heartbeat. Any of these symptoms may represent a serious problem that is an emergency. Do not wait to see if the symptoms will go away.   Get medical help at once. Call your local emergency services (911 in [place] Do not drive yourself to the hospital. This information is not intended to replace advice given to you by your health care provider. Make sure you discuss any questions you have with your health care provider. Document Released: 05/13/2004 Document Revised: 09/11/2015 Document Reviewed: 10/06/2012 Elsevier  Interactive Patient Education  2017 Elsevier Inc.      Chronic Venous Insufficiency Chronic venous insufficiency, also called venous stasis, is a condition that prevents blood from being pumped effectively through the veins in your legs. Blood may no longer be pumped effectively from the legs back to the heart. This condition can range from mild to severe. With proper treatment, you should be able to continue with an active life. What are the causes? Chronic venous insufficiency occurs when the vein walls become stretched, weakened, or damaged, or when valves within the vein are damaged. Some common causes of this include:  High blood pressure inside the veins (venous hypertension).  Increased blood pressure in the leg veins from long periods of sitting or standing.  A blood clot that blocks blood flow in a vein (deep vein thrombosis, DVT).  Inflammation of a vein (phlebitis) that causes a blood clot to form.  Tumors in the pelvis that cause blood to back up.  What increases the risk? The following factors may make you more likely to develop this condition:  Having a family history of this condition.  Obesity.  Pregnancy.  Living without enough physical activity or exercise (sedentary lifestyle).  Smoking.  Having a job that requires long periods of standing or sitting in one place.  Being a certain age. Women in their 78s and 50s and men in their 82s are more likely to develop this condition.  What are the signs or symptoms? Symptoms of this condition include:  Veins that are enlarged, bulging, or twisted (varicose veins).  Skin breakdown or ulcers.  Reddened or discolored skin on the front of the leg.  Brown, smooth, tight, and painful skin just above the ankle, usually on the inside of the leg (lipodermatosclerosis).  Swelling.  How is this diagnosed? This condition may be diagnosed based on:  Your medical history.  A physical exam.  Tests, such as: ? A  procedure that creates an image of a blood vessel and nearby organs and provides information about blood flow through the blood vessel (duplex ultrasound). ? A procedure that tests blood flow (plethysmography). ? A procedure to look at the veins using X-ray and dye (venogram).  How is this treated? The goals of treatment are to help you return to an active life and to minimize pain or disability. Treatment depends on the severity of your condition, and it may include:  Wearing compression stockings. These can help relieve symptoms and help prevent your condition from getting worse. However, they do not cure the condition.  Sclerotherapy. This is a procedure involving an injection of a material that "dissolves" damaged veins.  Surgery. This may involve: ? Removing a diseased vein (vein stripping). ? Cutting off blood flow through the vein (laser ablation surgery). ? Repairing a valve.  Follow these instructions at home:  Wear compression stockings as told by your health care provider. These stockings help to prevent blood clots and reduce swelling in your legs.  Take over-the-counter and prescription medicines only as told by your health care provider.  Stay active by exercising, walking, or doing different activities. Ask your health care provider what  activities are safe for you and how much exercise you need.  Drink enough fluid to keep your urine clear or pale yellow.  Do not use any products that contain nicotine or tobacco, such as cigarettes and e-cigarettes. If you need help quitting, ask your health care provider.  Keep all follow-up visits as told by your health care provider. This is important. Contact a health care provider if:  You have redness, swelling, or more pain in the affected area.  You see a red streak or line that extends up or down from the affected area.  You have skin breakdown or a loss of skin in the affected area, even if the breakdown is small.  You  get an injury in the affected area. Get help right away if:  You get an injury and an open wound in the affected area.  You have severe pain that does not get better with medicine.  You have sudden numbness or weakness in the foot or ankle below the affected area, or you have trouble moving your foot or ankle.  You have a fever and you have worse or persistent symptoms.  You have chest pain.  You have shortness of breath. Summary  Chronic venous insufficiency, also called venous stasis, is a condition that prevents blood from being pumped effectively through the veins in your legs.  Chronic venous insufficiency occurs when the vein walls become stretched, weakened, or damaged, or when valves within the vein are damaged.  Treatment for this condition depends on how severe your condition is, and it may involve wearing compression stockings or having a procedure.  Make sure you stay active by exercising, walking, or doing different activities. Ask your health care provider what activities are safe for you and how much exercise you need. This information is not intended to replace advice given to you by your health care provider. Make sure you discuss any questions you have with your health care provider. Document Released: 08/09/2006 Document Revised: 02/23/2016 Document Reviewed: 02/23/2016 Elsevier Interactive Patient Education  2017 [employer].     To decrease swelling in your feet and legs: Elevate feet above slightly bent knees, feet above heart, overnight and 3-4 times per day for 20 minutes.

## 2017-01-18 NOTE — Progress Notes (Signed)
Chief Complaint: Follow up Extracranial Carotid Artery Stenosis   History of Present Illness  Kerry Waters is a 71 y.o. male who had a stroke in January 2014 which affected his vision somewhat and states that he also had some tingling in some fingers of the left hand. Otherwise he is relatively free of any neurologic deficits. He had a carotid ultrasound performed at [facility] imaging which was interpreted as having 50-69% narrowing on the left and less than 50% on the right ICA.  He denies any subsequent stroke or TIA symptoms.  He is known to Dr. Donnetta Hutching having previously undergone laser ablation procedures in his saphenous veins for severe venous insufficiency with edema and skin changes.  He continues to take Lipitor and 325 mg Aspirin daily.    Patient has not had previous carotid artery intervention.  His walking seems limited by his morbid obesity, he uses a cane.   Pt Diabetic: yes, states his last A1C was 7.0 Pt smoker: former smoker, quit in 1987, started smoking about age 48  Pt meds include: Statin : yes ASA: yes Other anticoagulants/antiplatelets: no   Past Medical History:  Diagnosis Date  . Basal cell carcinoma   . Carotid stenosis    a. Carotid US (04/2012):  bilateral ICA 40-59%  . Coronary artery disease    a. s/p CABG 1993;  b  Lexiscan 2015  . Diabetes mellitus    Dr. [name] follows.  [name] History of MI (myocardial infarction) 1987   ANTERIOR SEPTAL  . History of stroke    occipital in 04/2012 tx at [facility]  . Hyperlipidemia   . Hypertension   . Ischemic cardiomyopathy    a.  Echo (01/08/2013): Mild LVH, EF 25-30%, AS akinesis, Inf HK, Ant HK, Gr 1 DD, Ao sclerosis, no AS, mod MAC, mild LAE.;   b.  Echo (05/10/13): EF 35-40%, anteroseptal, anterior, anterolateral and apical HK.  . NSTEMI (non-ST elevated myocardial infarction) ([place])    a. 12/2012 in setting of sepsis syndrome - EF 25-30% on echo; ? Type 2 NSTEMI; b. f/u echo 04/2013 with EF 35-40%; Myoview with  inf and AL infarct and peri-infarct ischemia, EF 38% => Med Rx recommended  . Obesities, morbid ([place])   . Peripheral neuropathy   . PVD (peripheral vascular disease) ([place])   . Sepsis ([place]) 12/2012  . Sleep apnea    CPAP  . SVT (supraventricular tachycardia) ([place])    a. 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  . Toe osteomyelitis, right ([place])    a. s/p R great toe amputation  . Venous stasis     Social History Social History  Substance Use Topics  . Smoking status: Former Smoker    Packs/day: 1.00    Years: 30.00    Types: Cigarettes    Quit date: 04/19/1985  . Smokeless tobacco: Never Used  . Alcohol use 0.0 oz/week     Comment: 3x a week    Family History Family History  Problem Relation Age of Onset  . Heart attack Father   . Heart disease Father        before age 61  . Cancer Mother        stomach cancer  . Heart attack Maternal Grandfather   . Heart attack Paternal Grandfather   . Cancer Sister        breast  . Diabetes Son  Surgical History Past Surgical History:  Procedure Laterality Date  . AMPUTATION Right 06/22/2013   Procedure: AMPUTATION DIGIT- right great toe;  Surgeon: Newt Minion, MD;  Location: [facility];  Service: Orthopedics;  Laterality: Right;  Right Great Toe Amputation at [facility]  . CORONARY ARTERY BYPASS GRAFT  1993   LIMA to LAD with patch angioplasty, SVG to OM, SVG to OM & PD and patch angioplasy to PDA  . Left ankle ORIF  2000  . PILONIDAL CYST EXCISION  30 yrs. ago x2    No Known Allergies  Current Outpatient Prescriptions  Medication Sig Dispense Refill  . aspirin EC 325 MG tablet Take 325 mg by mouth daily.    [name] atorvastatin (LIPITOR) 80 MG tablet Take 80 mg by mouth daily.    . carvedilol (COREG) 25 MG tablet TAKE 1 TABLET BY MOUTH TWICE DAILY WITH MEALS 180 tablet 3  . Coenzyme Q10 (COQ10 PO) Take 300 mg by  mouth daily.    . furosemide (LASIX) 20 MG tablet Take 20 mg by mouth daily.     [name] HYDROcodone-acetaminophen (NORCO) 5-325 MG per tablet Take 1-2 tablets by mouth every 4 (four) hours as needed for moderate pain. 40 tablet 0  . insulin NPH-regular Human (NOVOLIN 70/30) (70-30) 100 UNIT/ML injection Inject 50 Units into the skin See admin instructions. 50 UNITS EVERY MORNING AND 45 UNITS EVERY EVENING WITH MEALS    . lisinopril (PRINIVIL,ZESTRIL) 20 MG tablet TAKE ONE TABLET BY MOUTH ONCE DAILY (Patient taking differently: Take 20 mg by mouth twoi times a day) 90 tablet 2  . metFORMIN (GLUCOPHAGE) 500 MG tablet Take 2 tablets every morning and 1 tablet every evening.    . Multiple Vitamins-Minerals (PRESERVISION AREDS 2 PO) Take 1 tablet by mouth 2 (two) times daily.    . nitroGLYCERIN (NITROSTAT) 0.4 MG SL tablet Place 1 tablet (0.4 mg total) under the tongue every 5 (five) minutes as needed for chest pain. 25 tablet 11   No current facility-administered medications for this visit.     Review of Systems : See HPI for pertinent positives and negatives.  Physical Examination  Vitals:   01/18/17 1548 01/18/17 1552  BP: (!) 180/90 (!) 170/88  Pulse: 68   Resp: 20   Temp: (!) 97.1 F (36.2 C)   TempSrc: Oral   SpO2: 91%   Weight: (!) 342 lb (155.1 kg)   Height: _0  (1.803 m)    Body mass index is 47.7 kg/m.  General: WDWN morbidly obese male in NAD GAIT: using a cane Eyes: PERRLA Pulmonary:  Respirations are non-labored, good air movement, CTAB, no rales, rhonchi, or wheezing.  Cardiac: regular rhythm, no detected murmur.  VASCULAR EXAM Carotid Bruits Right Left   Negative Negative     Abdominal aortic pulse is not palpable. Radial pulses are 2+ palpable and equal.  LE Pulses Right Left       POPLITEAL  not palpable   not palpable       POSTERIOR  TIBIAL  not palpable   not palpable        DORSALIS PEDIS      ANTERIOR TIBIAL not palpable  not palpable     Gastrointestinal: soft, nontender, BS WNL, no r/g, no palpable masses.  Musculoskeletal: no muscle atrophy/wasting. M/S 5/5 throughout, extremities without ischemic changes. 1+ pitting and 2+ non pitting pretibial edema bilaterally with hemosiderin deposits.   Neurologic:  A&O X 3; appropriate affect, sensation is normal; speech is normal, CN 2-12 intact, pain and light touch intact in extremities, motor exam as listed above.    Assessment: Kerry Waters is a 71 y.o. male who had a stroke in January 2014 which affected his vision somewhat and states that he also had some tingling in some fingers of the left hand. He has not had any subsequent stroke or TIA.   His atherosclerotic risk factors include CAD with hx of MI, morbid obesity, OSA, DM, uncontrolled hypertension, and 30 year hx of smoking.   Chronic Venous Insufficiency with pitting and non pitting pretibial edema; see Patient Instructions. He states he has several pair of knee hight compression hose, but he is unable to donn, his wife has to help him put on his regular socks.    I advised pt to call his cardiologist office ASAP re his elevated blood pressure. Pt has wheezing with change of season, states has recent wheezing, denies cough, denies chest pain, denies headache or feeling light headed.   DATA Carotid Duplex (01/18/17): Right ICA: 40-59% stenosis. Left ICA: <40% stenosis. Bilateral vertebral artery flow is antegrade.  Bilateral subclavian artery waveforms are normal.  Technically difficult exam due to breathing artifact and depth of vessels.  No change since the exam of 01-13-16.    Plan: Follow-up in 1 year with Carotid Duplex scan.   I discussed in depth with the patient the nature of atherosclerosis, and emphasized the importance of maximal medical management including strict control of blood  pressure, blood glucose, and lipid levels, obtaining regular exercise, and continued cessation of smoking.  The patient is aware that without maximal medical management the underlying atherosclerotic disease process will progress, limiting the benefit of any interventions. The patient was given information about stroke prevention and what symptoms should prompt the patient to seek immediate medical care. Thank you for allowing [place] to participate in this patient's care.  Clemon Chambers, RN, MSN, FNP-C [facility] Office: [phone]  Clinic Physician: Early  01/18/17 4:19 PM

## 2017-01-21 DIAGNOSIS — Z6841 Body Mass Index (BMI) 40.0 and over, adult: Secondary | ICD-10-CM | POA: Diagnosis not present

## 2017-01-21 DIAGNOSIS — E1140 Type 2 diabetes mellitus with diabetic neuropathy, unspecified: Secondary | ICD-10-CM | POA: Diagnosis not present

## 2017-01-21 DIAGNOSIS — I10 Essential (primary) hypertension: Secondary | ICD-10-CM | POA: Diagnosis not present

## 2017-01-21 DIAGNOSIS — I5022 Chronic systolic (congestive) heart failure: Secondary | ICD-10-CM | POA: Diagnosis not present

## 2017-01-23 NOTE — Assessment & Plan Note (Signed)
I don't know if he can get himself and to arrange where he would be a bariatric surgical candidate, especially recognizing his comorbidities. Consider bariatric counseling.

## 2017-01-23 NOTE — Assessment & Plan Note (Signed)
Treatment emergent central apnea. Appropriate to continue focusing on the obstructive component. He is satisfied that he continues to benefit. Compliance is good. Plan- replace old machine and changed to auto 5-20

## 2017-02-03 ENCOUNTER — Telehealth: Payer: Self-pay | Admitting: Internal Medicine

## 2017-02-03 NOTE — Telephone Encounter (Signed)
Called Amy back but she was not available. Spoke with another rep and asked for the form to be re-faxed to the triage fax machine. She stated that she would email Amy to resend this fax.

## 2017-02-04 DIAGNOSIS — I10 Essential (primary) hypertension: Secondary | ICD-10-CM | POA: Diagnosis not present

## 2017-02-04 DIAGNOSIS — J069 Acute upper respiratory infection, unspecified: Secondary | ICD-10-CM | POA: Diagnosis not present

## 2017-02-04 DIAGNOSIS — Z6841 Body Mass Index (BMI) 40.0 and over, adult: Secondary | ICD-10-CM | POA: Diagnosis not present

## 2017-02-07 NOTE — Telephone Encounter (Signed)
Checked with KW who has not yet received this fax. Attempted to call [facility] to re-request form, an unnamed rep advised that their system was currently down and asked to call back later.  Wcb.

## 2017-02-08 NOTE — Telephone Encounter (Signed)
Found fax on the fax machine in triage. Will place on CY's cart for him to review.

## 2017-02-08 NOTE — Telephone Encounter (Signed)
Form completed.

## 2017-02-08 NOTE — Telephone Encounter (Signed)
Faxed form back to [facility] patient. Nothing further is needed.

## 2017-02-10 ENCOUNTER — Telehealth: Payer: Self-pay | Admitting: Internal Medicine

## 2017-02-10 NOTE — Telephone Encounter (Signed)
There was a copy of a One Step Order Form scanned into Epic that Dr. Annamaria Boots had signed on 01/20/2017 and it was faxed to [facility] to the same number that Amy wanted it faxed to today I reprinted it even though it is not a great copy and refaxed it attn: Amy

## 2017-02-10 NOTE — Telephone Encounter (Signed)
Will route to PCC's to f/u on. Thanks.

## 2017-02-15 ENCOUNTER — Encounter: Payer: Self-pay | Admitting: Internal Medicine

## 2017-02-16 DIAGNOSIS — I10 Essential (primary) hypertension: Secondary | ICD-10-CM | POA: Diagnosis not present

## 2017-02-16 DIAGNOSIS — I5022 Chronic systolic (congestive) heart failure: Secondary | ICD-10-CM | POA: Diagnosis not present

## 2017-02-16 DIAGNOSIS — Z6841 Body Mass Index (BMI) 40.0 and over, adult: Secondary | ICD-10-CM | POA: Diagnosis not present

## 2017-02-16 DIAGNOSIS — N183 Chronic kidney disease, stage 3 (moderate): Secondary | ICD-10-CM | POA: Diagnosis not present

## 2017-03-14 ENCOUNTER — Ambulatory Visit (INDEPENDENT_AMBULATORY_CARE_PROVIDER_SITE_OTHER): Payer: Medicare Other | Admitting: Podiatry

## 2017-03-14 ENCOUNTER — Encounter: Payer: Self-pay | Admitting: Podiatry

## 2017-03-14 DIAGNOSIS — E1142 Type 2 diabetes mellitus with diabetic polyneuropathy: Secondary | ICD-10-CM

## 2017-03-14 DIAGNOSIS — B351 Tinea unguium: Secondary | ICD-10-CM | POA: Diagnosis not present

## 2017-03-14 DIAGNOSIS — Z89411 Acquired absence of right great toe: Secondary | ICD-10-CM | POA: Diagnosis not present

## 2017-03-14 NOTE — Progress Notes (Addendum)
  Subjective:  Patient ID: Kerry Waters, male    DOB: [birth year],  MRN: [id number]  71 y.o. male returns for diabetic foot care. Last AMBS was 111. Hx amputation R hallux 3 years ago. Last saw PCP 1 month ago. Reports numbness and tingling in their feet. Denies cramping in legs and thighs.  Objective:  There were no vitals filed for this visit. General AA&O x3. Normal mood and affect.  Vascular Dorsalis pedis pulses present 1+ bilaterally  Posterior tibial pulses absent bilaterally  Capillary refill delayed ~ 5 seconds to all digits. Pedal hair growth absent. Varicosities bilat.  Neurologic Epicritic sensation present bilaterally. Protective sensation with 5.07 monofilament  absent bilaterally.  Dermatologic No open lesions. Interspaces clear of maceration.  Normal skin temperature and turgor. Hyperkeratotic lesions: none bilaterally. Nails: brittle, discoloration yellow, onychomycosis, thickening  Orthopedic: R Hallux Amputation Noted MMT 5/5 in dorsiflexion, plantarflexion, inversion, and eversion. Normal lower extremity joint ROM without pain or crepitus.   Assessment & Plan:  Patient was evaluated and treated and all questions answered.  Diabetes with History of Amputation, Onychomycosis -Educated on diabetic footcare. Diabetic risk level 3 -At risk foot care provided as below. -Discussed would benefit from DM shoes with toe filler. Will fill out prescription for shoes / filler.  Procedure: Nail Debridement Rationale: Patient meets criteria for routine foot care due to Class A findings Type of Debridement: manual, sharp debridement. Instrumentation: Nail nipper, rotary burr. Number of Nails: 9  Return in about 3 months (around 06/14/2017) for Diabetic Foot Care.

## 2017-03-15 DIAGNOSIS — Z6841 Body Mass Index (BMI) 40.0 and over, adult: Secondary | ICD-10-CM | POA: Diagnosis not present

## 2017-03-15 DIAGNOSIS — I10 Essential (primary) hypertension: Secondary | ICD-10-CM | POA: Diagnosis not present

## 2017-03-16 NOTE — Addendum Note (Signed)
Addended by: Lianne Cure A on: 03/16/2017 12:51 PM   Modules accepted: Orders

## 2017-03-17 DIAGNOSIS — E1140 Type 2 diabetes mellitus with diabetic neuropathy, unspecified: Secondary | ICD-10-CM | POA: Diagnosis not present

## 2017-03-17 DIAGNOSIS — I7389 Other specified peripheral vascular diseases: Secondary | ICD-10-CM | POA: Diagnosis not present

## 2017-03-17 DIAGNOSIS — R2689 Other abnormalities of gait and mobility: Secondary | ICD-10-CM | POA: Diagnosis not present

## 2017-03-17 DIAGNOSIS — Z6841 Body Mass Index (BMI) 40.0 and over, adult: Secondary | ICD-10-CM | POA: Diagnosis not present

## 2017-03-17 DIAGNOSIS — R05 Cough: Secondary | ICD-10-CM | POA: Diagnosis not present

## 2017-03-17 DIAGNOSIS — E1151 Type 2 diabetes mellitus with diabetic peripheral angiopathy without gangrene: Secondary | ICD-10-CM | POA: Diagnosis not present

## 2017-03-17 DIAGNOSIS — Z89411 Acquired absence of right great toe: Secondary | ICD-10-CM | POA: Diagnosis not present

## 2017-03-17 DIAGNOSIS — I130 Hypertensive heart and chronic kidney disease with heart failure and stage 1 through stage 4 chronic kidney disease, or unspecified chronic kidney disease: Secondary | ICD-10-CM | POA: Diagnosis not present

## 2017-03-17 DIAGNOSIS — Z794 Long term (current) use of insulin: Secondary | ICD-10-CM | POA: Diagnosis not present

## 2017-03-17 DIAGNOSIS — J069 Acute upper respiratory infection, unspecified: Secondary | ICD-10-CM | POA: Diagnosis not present

## 2017-03-18 DIAGNOSIS — I10 Essential (primary) hypertension: Secondary | ICD-10-CM | POA: Diagnosis not present

## 2017-03-24 ENCOUNTER — Other Ambulatory Visit: Payer: Medicare Other | Admitting: *Deleted

## 2017-05-18 DIAGNOSIS — H35363 Drusen (degenerative) of macula, bilateral: Secondary | ICD-10-CM | POA: Diagnosis not present

## 2017-05-26 ENCOUNTER — Ambulatory Visit (INDEPENDENT_AMBULATORY_CARE_PROVIDER_SITE_OTHER): Payer: Medicare Other | Admitting: Sports Medicine

## 2017-05-26 DIAGNOSIS — Z89411 Acquired absence of right great toe: Secondary | ICD-10-CM

## 2017-05-26 DIAGNOSIS — I739 Peripheral vascular disease, unspecified: Secondary | ICD-10-CM

## 2017-05-26 DIAGNOSIS — E1142 Type 2 diabetes mellitus with diabetic polyneuropathy: Secondary | ICD-10-CM

## 2017-06-14 ENCOUNTER — Ambulatory Visit (INDEPENDENT_AMBULATORY_CARE_PROVIDER_SITE_OTHER): Payer: Medicare Other | Admitting: Podiatry

## 2017-06-14 DIAGNOSIS — Z89411 Acquired absence of right great toe: Secondary | ICD-10-CM

## 2017-06-14 DIAGNOSIS — E1142 Type 2 diabetes mellitus with diabetic polyneuropathy: Secondary | ICD-10-CM | POA: Diagnosis not present

## 2017-06-14 DIAGNOSIS — B351 Tinea unguium: Secondary | ICD-10-CM | POA: Diagnosis not present

## 2017-06-14 NOTE — Progress Notes (Signed)
  Subjective:  Patient ID: Kerry Waters, male    DOB: 1946/02/06,  MRN: [id number]  72 y.o. male returns for diabetic foot care.  Last blood glucose 102.  PCP Dr. [name].  Last seen 2 months ago. Got his DM shoes but not wearing today. Denies new issues.  Objective:  There were no vitals filed for this visit. General AA&O x3. Normal mood and affect.  Vascular Dorsalis pedis pulses present 1+ bilaterally  Posterior tibial pulses absent bilaterally  Capillary refill delayed ~ 5 seconds to all digits. Pedal hair growth absent. Varicosities bilat.  Neurologic Epicritic sensation present bilaterally. Protective sensation with 5.07 monofilament  absent bilaterally.  Dermatologic No open lesions. Interspaces clear of maceration.  Normal skin temperature and turgor. Hyperkeratotic lesions: none bilaterally. Nails: brittle, discoloration yellow, onychomycosis, thickening  Orthopedic: R Hallux Amputation Noted MMT 5/5 in dorsiflexion, plantarflexion, inversion, and eversion. Normal lower extremity joint ROM without pain or crepitus.   Assessment & Plan:  Patient was evaluated and treated and all questions answered.  Diabetes with History of Amputation, Onychomycosis -Educated on diabetic footcare. Diabetic risk level 3 -At risk foot care as below  Procedure: Nail Debridement Rationale: Patient meets criteria for routine foot care due to Class A findings Type of Debridement: manual, sharp debridement. Instrumentation: Nail nipper, rotary burr. Number of Nails: 9    No Follow-up on file.

## 2017-06-16 DIAGNOSIS — E7849 Other hyperlipidemia: Secondary | ICD-10-CM | POA: Diagnosis not present

## 2017-06-16 DIAGNOSIS — E1140 Type 2 diabetes mellitus with diabetic neuropathy, unspecified: Secondary | ICD-10-CM | POA: Diagnosis not present

## 2017-06-16 DIAGNOSIS — N183 Chronic kidney disease, stage 3 (moderate): Secondary | ICD-10-CM | POA: Diagnosis not present

## 2017-06-16 DIAGNOSIS — Z125 Encounter for screening for malignant neoplasm of prostate: Secondary | ICD-10-CM | POA: Diagnosis not present

## 2017-06-23 DIAGNOSIS — Z89411 Acquired absence of right great toe: Secondary | ICD-10-CM | POA: Diagnosis not present

## 2017-06-23 DIAGNOSIS — D692 Other nonthrombocytopenic purpura: Secondary | ICD-10-CM | POA: Diagnosis not present

## 2017-06-23 DIAGNOSIS — Z6841 Body Mass Index (BMI) 40.0 and over, adult: Secondary | ICD-10-CM | POA: Diagnosis not present

## 2017-06-23 DIAGNOSIS — Z0000 Encounter for general adult medical examination without abnormal findings: Secondary | ICD-10-CM | POA: Diagnosis not present

## 2017-06-23 DIAGNOSIS — I5022 Chronic systolic (congestive) heart failure: Secondary | ICD-10-CM | POA: Diagnosis not present

## 2017-06-23 DIAGNOSIS — I495 Sick sinus syndrome: Secondary | ICD-10-CM | POA: Diagnosis not present

## 2017-06-23 DIAGNOSIS — Z794 Long term (current) use of insulin: Secondary | ICD-10-CM | POA: Diagnosis not present

## 2017-06-23 DIAGNOSIS — R2689 Other abnormalities of gait and mobility: Secondary | ICD-10-CM | POA: Diagnosis not present

## 2017-06-23 DIAGNOSIS — Z1389 Encounter for screening for other disorder: Secondary | ICD-10-CM | POA: Diagnosis not present

## 2017-06-23 DIAGNOSIS — I130 Hypertensive heart and chronic kidney disease with heart failure and stage 1 through stage 4 chronic kidney disease, or unspecified chronic kidney disease: Secondary | ICD-10-CM | POA: Diagnosis not present

## 2017-06-23 DIAGNOSIS — I7389 Other specified peripheral vascular diseases: Secondary | ICD-10-CM | POA: Diagnosis not present

## 2017-06-24 ENCOUNTER — Telehealth: Payer: Self-pay | Admitting: Cardiology

## 2017-06-27 NOTE — Telephone Encounter (Signed)
OV is schedule with Kerry Waters 03/12

## 2017-06-28 ENCOUNTER — Ambulatory Visit (INDEPENDENT_AMBULATORY_CARE_PROVIDER_SITE_OTHER): Payer: Medicare Other | Admitting: Physician Assistant

## 2017-06-28 ENCOUNTER — Encounter: Payer: Self-pay | Admitting: Physician Assistant

## 2017-06-28 VITALS — BP 142/78 | HR 54 | Ht 71.0 in | Wt 345.2 lb

## 2017-06-28 DIAGNOSIS — I10 Essential (primary) hypertension: Secondary | ICD-10-CM

## 2017-06-28 DIAGNOSIS — E785 Hyperlipidemia, unspecified: Secondary | ICD-10-CM

## 2017-06-28 DIAGNOSIS — I255 Ischemic cardiomyopathy: Secondary | ICD-10-CM

## 2017-06-28 DIAGNOSIS — R0609 Other forms of dyspnea: Secondary | ICD-10-CM

## 2017-06-28 DIAGNOSIS — R5382 Chronic fatigue, unspecified: Secondary | ICD-10-CM

## 2017-06-28 DIAGNOSIS — Z8673 Personal history of transient ischemic attack (TIA), and cerebral infarction without residual deficits: Secondary | ICD-10-CM

## 2017-06-28 DIAGNOSIS — I2510 Atherosclerotic heart disease of native coronary artery without angina pectoris: Secondary | ICD-10-CM

## 2017-06-28 DIAGNOSIS — I6523 Occlusion and stenosis of bilateral carotid arteries: Secondary | ICD-10-CM

## 2017-06-28 DIAGNOSIS — I5022 Chronic systolic (congestive) heart failure: Secondary | ICD-10-CM

## 2017-06-28 MED ORDER — CARVEDILOL 12.5 MG PO TABS: 12.5000 mg | ORAL_TABLET | Freq: Two times a day (BID) | ORAL | 3 refills | Status: DC

## 2017-06-28 MED ORDER — HYDRALAZINE HCL 25 MG PO TABS: 25.0000 mg | ORAL_TABLET | Freq: Three times a day (TID) | ORAL | 5 refills | Status: DC

## 2017-06-28 NOTE — Progress Notes (Signed)
Cardiology Office Note    Date:  06/28/2017   ID:  Kerry Waters, DOB 1946/03/14, MRN [id number]  PCP:  Shon Baton, MD  Cardiologist:  Dr. Percival Spanish   Chief Complaint  Patient presents with  . Follow-up    pt c/o no energy and SOB    History of Present Illness:  Kerry Waters is a 72 y.o. male with PMH of CAD, ICM, basal cell carcinoma, carotid artery disease, hypertension, hyperlipidemia, morbid obesity and history of CVA.  He had anteroseptal myocardial infarction in 1987.  He subsequently had CABG in 1993.  Stress test in June 2008 showed EF 40%, mild inferior hypokinesis with possible apical hypokinesis.  He was previously followed by Dr. Romeo Apple until 2012.  His ejection fraction reached a nadir of 25-30% by echocardiogram in September 2014.  Since then, it has improved to 35-40% based on echocardiogram in January 2015, and 45-50% with repeat echocardiogram in June 2018.  Last Myoview in March 2015 showed inferior scar with peri-infarct ischemia, EF 38%, medical therapy was recommended.  He has also been followed by Dr. Baird Lyons of pulmonology service for his obstructive sleep apnea.  His last office visit with Dr. Percival Spanish was on 09/30/2016, at which time he was doing well from cardiology perspective.  Echocardiogram obtained on 10/13/2016 showed EF 45-50%, overall poor image quality, septal apical and inferior basal hypokinesis, mild aortic stenosis, PA peak pressure 38 mmHg.  Patient presents today for evaluation of fatigue and dyspnea on exertion.  On physical exam, he has 1-2+ pitting edema in bilateral lower extremity, this is not unusual for him per patient.  His family doctor has recently increased his diuretic from baseline of 46m to 80 mg for 1 week before going back to the previous 40 mg daily.  According to the patient, he has been taking 40 mg daily for the past several month instead of our previously documented 20 mg daily of Lasix.  He denies any obvious  orthopnea or PND.  His lung is clear.  It also appears his lisinopril has been increased to 40 mg since his last office visit here as well.  I will decrease his carvedilol to 12.5 mg twice daily as sometimes his heart rate would drop down to the 40s based on a home monitor.  He was placed on amlodipine 5 mg and hydralazine 25 mg 3 times daily previously for uncontrolled high blood pressure, this made him feel bad and he stopped both medications.  Since his blood pressure medication will be decreased today, I asked him to restart hydralazine 25 mg 3 times a day.  Ultimately, I think exercise bicycle and water aerobic combined with weight loss likely will improve his symptoms.  He says his previous anginal symptoms included substernal chest pain, he has not really experienced the same symptom recently.  He would be a poor candidate for stress testing at this time.   Past Medical History:  Diagnosis Date  . Basal cell carcinoma   . Carotid stenosis    a. Carotid [place]04[place]):  bilateral ICA 40-59%  . Coronary artery disease    a. s/p CABG 1993;  b  Lexiscan 2015  . Diabetes mellitus    Dr. RVirgina Jockfollows.  .Marland KitchenHistory of MI (myocardial infarction) 1987   ANTERIOR SEPTAL  . History of stroke    [facility] . Hyperlipidemia   . Hypertension   . Ischemic cardiomyopathy    a.  Echo (01/08/2013): Mild LVH, EF 25-30%, AS akinesis, Inf HK, Ant HK, Gr 1 DD, Ao sclerosis, no AS, mod MAC, mild LAE.;   b.  Echo (05/10/13): EF 35-40%, anteroseptal, anterior, anterolateral and apical HK.  . NSTEMI (non-ST elevated myocardial infarction) ([place])    a. 12/2012 in setting of sepsis syndrome - EF 25-30% on echo; ? Type 2 NSTEMI; b. f/u echo 04/2013 with EF 35-40%; Myoview with inf and AL infarct and peri-infarct ischemia, EF 38% => Med Rx recommended  . Obesities, morbid ([place])   . Peripheral neuropathy   . PVD (peripheral vascular disease) ([place])   . Sepsis ([place]) 12/2012  . Sleep apnea    CPAP  . SVT  (supraventricular tachycardia) ([place])    a. 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  . Toe osteomyelitis, right ([place])    a. s/p R great toe amputation  . Venous stasis     Past Surgical History:  Procedure Laterality Date  . AMPUTATION Right 06/22/2013   Procedure: AMPUTATION DIGIT- right great toe;  Surgeon: Newt Minion, MD;  Location: [place];  Service: Orthopedics;  Laterality: Right;  Right Great Toe Amputation at MTP Joint  . CORONARY ARTERY BYPASS GRAFT  1993   LIMA to LAD with patch angioplasty, SVG to OM, SVG to OM & PD and patch angioplasy to PDA  . Left ankle ORIF  2000  . PILONIDAL CYST EXCISION  30 yrs. ago x2    Current Medications: Outpatient Medications Prior to Visit  Medication Sig Dispense Refill  . aspirin EC 325 MG tablet Take 325 mg by mouth daily.    [name] atorvastatin (LIPITOR) 80 MG tablet Take 80 mg by mouth daily.    . Coenzyme Q10 (COQ10 PO) Take 300 mg by mouth daily.    . furosemide (LASIX) 20 MG tablet Take 20 mg by mouth daily.     [name] HYDROcodone-acetaminophen (NORCO) 5-325 MG per tablet Take 1-2 tablets by mouth every 4 (four) hours as needed for moderate pain. 40 tablet 0  . insulin NPH-regular Human (NOVOLIN 70/30) (70-30) 100 UNIT/ML injection Inject 50 Units into the skin See admin instructions. 50 UNITS EVERY MORNING AND 45 UNITS EVERY EVENING WITH MEALS    . Krill Oil 500 MG CAPS Take 1 capsule by mouth daily.    [name] lisinopril (PRINIVIL,ZESTRIL) 20 MG tablet Take 40 mg by mouth daily.     . metFORMIN (GLUCOPHAGE) 500 MG tablet Take 2 tablets every morning and 1 tablet every evening.    . Multiple Vitamins-Minerals (PRESERVISION AREDS 2 PO) Take 1 tablet by mouth 2 (two) times daily.    . nitroGLYCERIN (NITROSTAT) 0.4 MG SL tablet Place 1 tablet (0.4 mg total) under the tongue every 5 (five) minutes as needed for chest pain. 25 tablet 11    . carvedilol (COREG) 25 MG tablet TAKE 1 TABLET BY MOUTH TWICE DAILY WITH MEALS 180 tablet 3  . hydrALAZINE (APRESOLINE) 25 MG tablet Take 25 mg by mouth 3 (three) times daily.    [name] lisinopril (PRINIVIL,ZESTRIL) 20 MG tablet TAKE ONE TABLET BY MOUTH ONCE DAILY (Patient taking differently: Take 20 mg by mouth twoi times a day) 90 tablet 2   No facility-administered medications prior to visit.      Allergies:   Patient has no known allergies.   Social History   Socioeconomic History  . Marital status: Married  Spouse name: None  . Number of children: None  . Years of education: None  . Highest education level: None  Social Needs  . Financial resource strain: None  . Food insecurity - worry: None  . Food insecurity - inability: None  . Transportation needs - medical: None  . Transportation needs - non-medical: None  Occupational History  . None  Tobacco Use  . Smoking status: Former Smoker    Packs/day: 1.00    Years: 30.00    Pack years: 30.00    Types: Cigarettes    Last attempt to quit: 04/19/1985    Years since quitting: 32.2  . Smokeless tobacco: Never Used  Substance and Sexual Activity  . Alcohol use: Yes    Alcohol/week: 0.0 oz    Comment: 3x a week  . Drug use: No  . Sexual activity: None  Other Topics Concern  . None  Social History Narrative  . None     Family History:  The patient's family history includes Cancer in his mother and sister; Diabetes in his son; Heart attack in his father, maternal grandfather, and paternal grandfather; Heart disease in his father.   ROS:   Please see the history of present illness.    ROS All other systems reviewed and are negative.   PHYSICAL EXAM:   VS:  BP (!) 142/78   Pulse (!) 54   Ht _0  (1.803 m)   Wt (!) 345 lb 3.2 oz (156.6 kg)   BMI 48.15 kg/m    GEN: Well nourished, well developed, in no acute distress  HEENT: normal  Neck: no JVD, carotid bruits, or masses Cardiac: RRR; no murmurs, rubs, or  gallops. 1-2+ edema  Respiratory:  clear to auscultation bilaterally, normal work of breathing GI: soft, nontender, nondistended, + BS MS: no deformity or atrophy  Skin: warm and dry, no rash Neuro:  Alert and Oriented x 3, Strength and sensation are intact Psych: euthymic mood, full affect  Wt Readings from Last 3 Encounters:  06/28/17 (!) 345 lb 3.2 oz (156.6 kg)  01/18/17 (!) 342 lb (155.1 kg)  01/17/17 (!) 342 lb (155.1 kg)      Studies/Labs Reviewed:   EKG:  EKG is ordered today.  The ekg ordered today demonstrates normal sinus rhythm, poor R wave progression in anterior leads.  First-degree AV block  Recent Labs: No results found for requested labs within last 8760 hours.   Lipid Panel No results found for: CHOL, TRIG, HDL, CHOLHDL, VLDL, LDLCALC, LDLDIRECT  Additional studies/ records that were reviewed today include:   Echo 10/13/2016  - Left ventricle: Poor image quality even with definity ? EF 45-50%   with septal apical and inferior basal hypokinesis   Consider MRI if clinically indicated to quantitate EF The cavity   size was normal. Wall thickness was increased in a pattern of   mild LVH. Left ventricular diastolic function parameters were   normal. - Aortic valve: There was mild stenosis. There was trivial   regurgitation. - Mitral valve: Calcified annulus. Mildly thickened leaflets . - Left atrium: The atrium was moderately dilated. - Atrial septum: No subcostal images. - Pulmonary arteries: PA peak pressure: 38 mm Hg (S).    ASSESSMENT:    1. Chronic fatigue   2. Dyspnea on exertion   3. Coronary artery disease involving native coronary artery of native heart without angina pectoris   4. Ischemic cardiomyopathy   5. Chronic systolic heart failure ([place])   6. Essential  hypertension   7. Hyperlipidemia, unspecified hyperlipidemia type   8. Bilateral carotid artery stenosis   9. H/O: CVA (cerebrovascular accident)      PLAN:  In order of problems  listed above:  1. Dyspnea on exertion and chronic fatigue: Has been worsening for the past 4-5 months.  He denies any chest pain that was seen during previous angina.  He is body size make him a less ideal candidate for stress testing.  I think his dyspnea on exertion is likely a combination of lack of exercise due to bad knee and also morbid obesity.  He does have 1-2+ pitting edema in bilateral lower extremity, he says this is usual for him but also mentions his primary care provider has recently increased his Lasix to 80 mg daily for 1 week before decreasing it back down to 40 mg daily thereafter.  I do agree with this change.  Our records still shows he is on 20 mg daily of Lasix, however according to patient, her Lasix was increased to 40 mg daily several months back.  -Recent lab work from his primary care provider has been reviewed.  Hemoglobin greater than 11.  Hemoglobin A1c 6.0.  TSH normal.  2. Chronic systolic heart failure: Currently on temporary increase of 80 mg daily of Lasix before dropping back down to 40 mg Lasix daily in a few days, renal function stable.  Still has 1+ pitting edema on exam.  He does look mildly volume overloaded.  However I do not think this necessarily explain his shortness of breath.  3. CAD: Has a history of MI in 1987, underwent bypass surgery to unknown arteries in 1993.  Last Myoview in 2015 showed infarct with mild peri-infarct ischemia.  Medical therapy was recommended.  4. Hypertension: Blood pressure mildly elevated today, will restart his hydralazine 25 mg 3 times daily.  He has been having some bradycardia recently and EKG showed first-degree AV block, will decrease carvedilol to 12.5 mg twice daily.  5. Hyperlipidemia on Lipitor 80 mg daily.  Recent total cholesterol, triglyceride and LDL all very well controlled, HDL 29 very low.  6. Bilateral carotid artery disease: Due for repeat carotid Doppler in October 2019  7. History of CVA: No  recurrence    Medication Adjustments/Labs and Tests Ordered: Current medicines are reviewed at length with the patient today.  Concerns regarding medicines are outlined above.  Medication changes, Labs and Tests ordered today are listed in the Patient Instructions below. Patient Instructions  Medication Instructions:  RESTART Hydralazine 73m take 1 tablet three times a day DECREASE Coreg to 12.551mtake 1 tablet twice a day  Labwork: None   Testing/Procedures: none  Follow-Up: Your physician recommends that you schedule a follow-up appointment in: 3 months with Dr HoPercival Spanish Any Other Special Instructions Will Be Listed Below (If Applicable). If you need a refill on your cardiac medications before your next appointment, please call your pharmacy.     SiHilbert CorriganPAUtah3/03/2018 1:55 PM    [facility] [address]  [id number]: [phone]: [phone]

## 2017-06-28 NOTE — Patient Instructions (Addendum)
Medication Instructions:  RESTART Hydralazine 25mg  take 1 tablet three times a day DECREASE Coreg to 12.5mg  take 1 tablet twice a day  Labwork: None   Testing/Procedures: none  Follow-Up: Your physician recommends that you schedule a follow-up appointment in: 3 months with Dr Percival Spanish.  Any Other Special Instructions Will Be Listed Below (If Applicable). If you need a refill on your cardiac medications before your next appointment, please call your pharmacy.

## 2017-08-16 ENCOUNTER — Ambulatory Visit (INDEPENDENT_AMBULATORY_CARE_PROVIDER_SITE_OTHER): Payer: Medicare Other | Admitting: Podiatry

## 2017-08-16 DIAGNOSIS — E1142 Type 2 diabetes mellitus with diabetic polyneuropathy: Secondary | ICD-10-CM

## 2017-08-16 DIAGNOSIS — Z89411 Acquired absence of right great toe: Secondary | ICD-10-CM

## 2017-08-16 DIAGNOSIS — B351 Tinea unguium: Secondary | ICD-10-CM | POA: Diagnosis not present

## 2017-08-16 NOTE — Progress Notes (Signed)
  Subjective:  Patient ID: Kerry Waters, male    DOB: 1945-11-04,  MRN: [id number]  72 y.o. male returns for diabetic foot care.  Last AMBS 78. Not wearing DM shoes with toe filler. States he does not wear them every day  Objective:  There were no vitals filed for this visit. General AA&O x3. Normal mood and affect.  Vascular Dorsalis pedis pulses present 1+ bilaterally  Posterior tibial pulses absent bilaterally  Capillary refill delayed ~ 5 seconds to all digits. Pedal hair growth absent. Varicosities bilat.  Neurologic Epicritic sensation present bilaterally. Protective sensation with 5.07 monofilament  absent bilaterally.  Dermatologic No open lesions. Interspaces clear of maceration.  Normal skin temperature and turgor. Hyperkeratotic lesions: none bilaterally. Nails: brittle, discoloration yellow, onychomycosis, thickening  Orthopedic: R Hallux Amputation Noted MMT 5/5 in dorsiflexion, plantarflexion, inversion, and eversion. Normal lower extremity joint ROM without pain or crepitus.   Assessment & Plan:  Patient was evaluated and treated and all questions answered.  Diabetes with History of Amputation, Onychomycosis -Educated on diabetic footcare. Diabetic risk level 3 -At risk foot care as below  Procedure: Nail Debridement Rationale: Patient meets criteria for routine foot care due to class A findings. Type of Debridement: manual, sharp debridement. Instrumentation: Nail nipper, rotary burr. Number of Nails: 9      No follow-ups on file.

## 2017-09-05 DIAGNOSIS — G4733 Obstructive sleep apnea (adult) (pediatric): Secondary | ICD-10-CM | POA: Diagnosis not present

## 2017-09-05 DIAGNOSIS — I5022 Chronic systolic (congestive) heart failure: Secondary | ICD-10-CM | POA: Diagnosis not present

## 2017-09-05 DIAGNOSIS — Z6841 Body Mass Index (BMI) 40.0 and over, adult: Secondary | ICD-10-CM | POA: Diagnosis not present

## 2017-09-05 DIAGNOSIS — J069 Acute upper respiratory infection, unspecified: Secondary | ICD-10-CM | POA: Diagnosis not present

## 2017-09-05 DIAGNOSIS — R0609 Other forms of dyspnea: Secondary | ICD-10-CM | POA: Diagnosis not present

## 2017-09-05 DIAGNOSIS — E1151 Type 2 diabetes mellitus with diabetic peripheral angiopathy without gangrene: Secondary | ICD-10-CM | POA: Diagnosis not present

## 2017-09-05 DIAGNOSIS — I509 Heart failure, unspecified: Secondary | ICD-10-CM | POA: Diagnosis not present

## 2017-09-05 DIAGNOSIS — I130 Hypertensive heart and chronic kidney disease with heart failure and stage 1 through stage 4 chronic kidney disease, or unspecified chronic kidney disease: Secondary | ICD-10-CM | POA: Diagnosis not present

## 2017-09-06 ENCOUNTER — Telehealth: Payer: Self-pay | Admitting: Cardiology

## 2017-09-06 DIAGNOSIS — R0609 Other forms of dyspnea: Secondary | ICD-10-CM | POA: Diagnosis not present

## 2017-09-06 NOTE — Telephone Encounter (Signed)
Received records from [facility] Appt 09/30/17 @ 10:20AM. [place]

## 2017-09-13 DIAGNOSIS — I5022 Chronic systolic (congestive) heart failure: Secondary | ICD-10-CM | POA: Diagnosis not present

## 2017-09-13 DIAGNOSIS — I509 Heart failure, unspecified: Secondary | ICD-10-CM | POA: Diagnosis not present

## 2017-09-13 DIAGNOSIS — H6983 Other specified disorders of Eustachian tube, bilateral: Secondary | ICD-10-CM | POA: Diagnosis not present

## 2017-09-13 DIAGNOSIS — R0609 Other forms of dyspnea: Secondary | ICD-10-CM | POA: Diagnosis not present

## 2017-09-13 DIAGNOSIS — I130 Hypertensive heart and chronic kidney disease with heart failure and stage 1 through stage 4 chronic kidney disease, or unspecified chronic kidney disease: Secondary | ICD-10-CM | POA: Diagnosis not present

## 2017-09-13 DIAGNOSIS — Z6841 Body Mass Index (BMI) 40.0 and over, adult: Secondary | ICD-10-CM | POA: Diagnosis not present

## 2017-09-15 NOTE — Progress Notes (Signed)
Patient ID: Kerry Waters, male   DOB: 23-Sep-1945, 72 y.o.   MRN: [id number]   Patient presents to [facility] for diabetic shoe pick up, shoes are tried on for good fit.  Patient received 1 pair Apex Men - Ariya Casual Walker Brown Moc Toe - Y910 in 11.5 x-wide and 3 pairs custom molded diabetic inserts.  Verbal and written break in and wear instructions given.  Patient will follow up with Dr. March Rummage or Cannon Kettle for scheduled routine care.

## 2017-09-16 ENCOUNTER — Encounter: Payer: Self-pay | Admitting: Sports Medicine

## 2017-09-19 ENCOUNTER — Ambulatory Visit (INDEPENDENT_AMBULATORY_CARE_PROVIDER_SITE_OTHER): Payer: Medicare Other | Admitting: Podiatry

## 2017-09-19 DIAGNOSIS — L97511 Non-pressure chronic ulcer of other part of right foot limited to breakdown of skin: Secondary | ICD-10-CM | POA: Diagnosis not present

## 2017-09-19 NOTE — Progress Notes (Signed)
  Subjective:  Patient ID: Kerry Waters, male    DOB: 07/12/1945,  MRN: [id number]  No chief complaint on file.  72 y.o. male returns for the above complaint.  Reports sore on the top of the right second toe states that it happened from hitting the door on the toe.  States that on Friday it was bleeding and he was concerned about the weight looks has been using Neosporin and states that it looks better.  Objective:  There were no vitals filed for this visit. General AA&O x3. Normal mood and affect.  Vascular Pedal pulses palpable.  Neurologic Epicritic sensation grossly intact.  Dermatologic Superficial ulcer R 2nd toe PIPJ with granular base. No drainage.   Orthopedic: No pain to palpation either foot.   Assessment & Plan:  Patient was evaluated and treated and all questions answered.  Ulcer R 2nd Toe Superficial -Cauterized with silver nitrate. -Silvadene and abx ointment applied.  -F/u in 2 weeks.  No follow-ups on file.

## 2017-09-23 NOTE — Progress Notes (Signed)
Reviewed outside lab, proBNP elevated 2500. Patient has upcoming reassessment by Dr. Percival Spanish. Note, his previous pro BNP is 17000 in Sept 2014

## 2017-09-29 NOTE — Progress Notes (Signed)
HPI The patient presents as a follow up of CAD. He has an ischemic cardiomyopathy.  His last ejection fraction was about 40 -45%.  He was seen in March with increased dyspnea and swelling. He was given a short course of increased Lasix.  However, in May he had continued increased SOB.  Shon Baton, MD treated with with 100 mg bid of Lasix for 10 days by his report.  He lost 35 lbs.  He started breathing much better.  Since then he has done better.  He is watching his salt and trying to keep his BP down.  The patient denies any new symptoms such as chest discomfort, neck or arm discomfort. There has been no new shortness of breath, PND or orthopnea. There have been no reported palpitations, presyncope or syncope.    No Known Allergies  Current Outpatient Medications  Medication Sig Dispense Refill  . aspirin EC 81 MG tablet Take 81 mg by mouth 2 (two) times daily.     [name] atorvastatin (LIPITOR) 80 MG tablet Take 80 mg by mouth daily.    . carvedilol (COREG) 12.5 MG tablet Take 1 tablet (12.5 mg total) by mouth 2 (two) times daily. 60 tablet 3  . Coenzyme Q10 (COQ10 PO) Take 300 mg by mouth daily.    . furosemide (LASIX) 20 MG tablet Take 20 mg by mouth daily.     . hydrALAZINE (APRESOLINE) 25 MG tablet Take 1 tablet (25 mg total) by mouth 3 (three) times daily. 90 tablet 5  . HYDROcodone-acetaminophen (NORCO) 5-325 MG per tablet Take 1-2 tablets by mouth every 4 (four) hours as needed for moderate pain. 40 tablet 0  . insulin NPH-regular Human (NOVOLIN 70/30) (70-30) 100 UNIT/ML injection Inject into the skin See admin instructions. 35 UNITS EVERY MORNING AND 25 UNITS EVERY EVENING WITH MEALS    . Krill Oil 500 MG CAPS Take 1 capsule by mouth daily.    [name] lisinopril (PRINIVIL,ZESTRIL) 20 MG tablet Take 40 mg by mouth daily.     . metFORMIN (GLUCOPHAGE) 500 MG tablet Take 2 tablets every morning and 1 tablet every evening.    . Multiple Vitamins-Minerals (PRESERVISION AREDS 2 PO) Take 1 tablet  by mouth 2 (two) times daily.    . nitroGLYCERIN (NITROSTAT) 0.4 MG SL tablet Place 1 tablet (0.4 mg total) under the tongue every 5 (five) minutes as needed for chest pain. 25 tablet 11   No current facility-administered medications for this visit.     Past Medical History:  Diagnosis Date  . Basal cell carcinoma   . Carotid stenosis    a. Carotid US (04/2012):  bilateral ICA 40-59%  . Coronary artery disease    a. s/p CABG 1993;  b  Lexiscan 2015  . Diabetes mellitus    Dr. [name] follows.  [name] History of MI (myocardial infarction) 1987   ANTERIOR SEPTAL  . History of stroke    occipital in 04/2012 tx at [facility]  . Hyperlipidemia   . Hypertension   . Ischemic cardiomyopathy    a.  Echo (01/08/2013): Mild LVH, EF 25-30%, AS akinesis, Inf HK, Ant HK, Gr 1 DD, Ao sclerosis, no AS, mod MAC, mild LAE.;   b.  Echo (05/10/13): EF 35-40%, anteroseptal, anterior, anterolateral and apical HK.  . NSTEMI (non-ST elevated myocardial infarction) ([place])    a. 12/2012 in setting of sepsis syndrome - EF 25-30% on echo; ? Type 2 NSTEMI; b. f/u echo 04/2013 with EF 35-40%; Myoview  with inf and AL infarct and peri-infarct ischemia, EF 38% => Med Rx recommended  . Obesities, morbid ([place])   . Peripheral neuropathy   . PVD (peripheral vascular disease) ([place])   . Sepsis ([place]) 12/2012  . Sleep apnea    CPAP  . SVT (supraventricular tachycardia) ([place])    a. 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  . Toe osteomyelitis, right ([place])    a. s/p R great toe amputation  . Venous stasis     Past Surgical History:  Procedure Laterality Date  . AMPUTATION Right 06/22/2013   Procedure: AMPUTATION DIGIT- right great toe;  Surgeon: Newt Minion, MD;  Location: [place];  Service: Orthopedics;  Laterality: Right;  Right Great Toe Amputation at MTP Joint  . CORONARY ARTERY BYPASS GRAFT  1993   LIMA to LAD with  patch angioplasty, SVG to OM, SVG to OM & PD and patch angioplasy to PDA  . Left ankle ORIF  2000  . PILONIDAL CYST EXCISION  30 yrs. ago x2    ROS:  As stated in the HPI and negative for all other systems.  PHYSICAL EXAM BP 135/77   Pulse 70   Ht 5' 11"  (1.803 m)   Wt (!) 311 lb 3.2 oz (141.2 kg)   SpO2 92%   BMI 43.40 kg/m   GENERAL:  Well appearing NECK:  No jugular venous distention, waveform within normal limits, carotid upstroke brisk and symmetric, no bruits, no thyromegaly LUNGS:  Clear to auscultation bilaterally CHEST:  Well healed sternotomy scar. HEART:  PMI not displaced or sustained,S1 and S2 within normal limits, no S3, no S4, no clicks, no rubs, no murmurs ABD:  Flat, positive bowel sounds normal in frequency in pitch, no bruits, no rebound, no guarding, no midline pulsatile mass, no hepatomegaly, no splenomegaly, obese EXT:  2 plus pulses throughout, mild edema, no cyanosis no clubbing  EKG:  NA    ASSESSMENT AND PLAN   HYPERTENSION -  The blood pressure is controlled.  No change in therapy.   Obesities, morbid -  The patient understands the need to lose weight with diet and exercise. We have discussed specific strategies for this.  I think he needs to see somebody about perhaps getting his knee fixed.  He needs to be more mobile .  CAD The patient has no angina.  At this point I am not suggesting cardiac cath.  See below.   DM His A1C was 6.0.  This is followed by Dr. [name].  ACUTE ON CHRONIC SYSTOLIC AND DIASTOLIC HF His ejection fraction has been moderately reduced.  He responded to high-dose diuretics and I think was very appropriately managed by Shon Baton, MD .    At this point I am not inclined to suggest need for heart catheterization or another echocardiogram as he is doing so much better.  I think rather he needs tremendous attention to lifestyle.  Not reducing his salt.  He needs to watch his fluid.  We talked about as needed dosing of his  diuretic.  We talked about increased mobility.

## 2017-09-30 ENCOUNTER — Ambulatory Visit (INDEPENDENT_AMBULATORY_CARE_PROVIDER_SITE_OTHER): Payer: Medicare Other | Admitting: Cardiology

## 2017-09-30 ENCOUNTER — Encounter: Payer: Self-pay | Admitting: Cardiology

## 2017-09-30 VITALS — BP 135/77 | HR 70 | Ht 71.0 in | Wt 311.2 lb

## 2017-09-30 DIAGNOSIS — I255 Ischemic cardiomyopathy: Secondary | ICD-10-CM | POA: Diagnosis not present

## 2017-09-30 DIAGNOSIS — I5043 Acute on chronic combined systolic (congestive) and diastolic (congestive) heart failure: Secondary | ICD-10-CM | POA: Diagnosis not present

## 2017-09-30 NOTE — Patient Instructions (Signed)

## 2017-11-15 ENCOUNTER — Encounter: Payer: Self-pay | Admitting: Podiatry

## 2017-11-15 ENCOUNTER — Ambulatory Visit (INDEPENDENT_AMBULATORY_CARE_PROVIDER_SITE_OTHER): Payer: Medicare Other | Admitting: Podiatry

## 2017-11-15 VITALS — BP 122/61 | HR 64 | Temp 98.0°F | Resp 16

## 2017-11-15 DIAGNOSIS — E1140 Type 2 diabetes mellitus with diabetic neuropathy, unspecified: Secondary | ICD-10-CM

## 2017-11-15 DIAGNOSIS — L97511 Non-pressure chronic ulcer of other part of right foot limited to breakdown of skin: Secondary | ICD-10-CM

## 2017-11-15 DIAGNOSIS — B351 Tinea unguium: Secondary | ICD-10-CM | POA: Diagnosis not present

## 2017-11-15 DIAGNOSIS — Z89411 Acquired absence of right great toe: Secondary | ICD-10-CM

## 2017-11-15 NOTE — Progress Notes (Signed)
  Subjective:  Patient ID: Kerry Waters, male    DOB: [birth year],  MRN: [id number]  Chief Complaint  Patient presents with  . debride    B/L nail trimming -FBS: 171  x 1 day A1C: 6.1 PCP: RUsso LOV: x 1 day   . Ulcer    F/U R 2nd toe ulcer Pt. stated," it's healed."   72 y.o. male returns for the above complaint. States the toe is doing well and he believes it to be healed. Last saw PCP yesterday.  Objective:   General AA&O x3. Normal mood and affect.  Vascular Dorsalis pedis pulses present 1+ bilaterally  Posterior tibial pulses absent bilaterally  Capillary refill normal to all digits. Pedal hair growth normal.  Neurologic Epicritic sensation present bilaterally. Protective sensation with 5.07 monofilament  present bilaterally. Vibratory sensation present bilaterally.  Dermatologic R 2nd toe ulcer healed. Interspaces clear of maceration.  Normal skin temperature and turgor. Hyperkeratotic lesions: None bilaterally. Nails: brittle, onychomycosis, thickening, elongation  Orthopedic: Hx R great toe amputation. MMT 5/5 in dorsiflexion, plantarflexion, inversion, and eversion. Normal lower extremity joint ROM without pain or crepitus.   Assessment & Plan:  Patient was evaluated and treated and all questions answered.  Ulcer R 2nd Toe Superficial -Healed.  Diabetes with Hx of Amputation, Onychomycosis -Educated on diabetic footcare. Diabetic risk level 3 -Nails x10 debrided sharply and manually with large nail nipper and rotary burr.  No follow-ups on file.

## 2018-01-18 ENCOUNTER — Ambulatory Visit (INDEPENDENT_AMBULATORY_CARE_PROVIDER_SITE_OTHER): Payer: Medicare Other | Admitting: Internal Medicine

## 2018-01-18 ENCOUNTER — Encounter: Payer: Self-pay | Admitting: Internal Medicine

## 2018-01-18 VITALS — BP 130/70 | HR 70 | Ht 69.0 in | Wt 314.8 lb

## 2018-01-18 DIAGNOSIS — I255 Ischemic cardiomyopathy: Secondary | ICD-10-CM | POA: Diagnosis not present

## 2018-01-18 DIAGNOSIS — G4733 Obstructive sleep apnea (adult) (pediatric): Secondary | ICD-10-CM

## 2018-01-18 DIAGNOSIS — Z23 Encounter for immunization: Secondary | ICD-10-CM | POA: Diagnosis not present

## 2018-01-18 NOTE — Patient Instructions (Addendum)
Order- flu vax senior  Order- DME  [facility]- please refit mask to reduce leak. Continue CPAP auto 5-20, mask of choice, humidifier, supplies, AirView  Please call if we can help

## 2018-01-18 NOTE — Assessment & Plan Note (Signed)
He benefits from CPAP with improved sleep.  Compliance is excellent.  Residual AHI is too high, probably because of mask leak. Plan-continue CPAP auto 5-20.  Ask DME to refit mask for better seal.

## 2018-01-18 NOTE — Assessment & Plan Note (Signed)
We understand this is likely a component of his dyspnea on exertion, which is stable by his report.  He is closely followed by cardiology.

## 2018-01-18 NOTE — Progress Notes (Signed)
Subjective:    Patient ID: Kerry Waters, male    DOB: [birth year], 72 y.o.   MRN: [id number]  HPI  male former smoker followed for obstructive sleep apnea complicated by obesity, HBP CAD/MI/ hx SVT, ischemic CM, CVA, DM2, morbid obesity NPSG 10/31/2007, AHI 98.4 ------------------------------------------------------------------------ 01/17/17-  72 year old male former smoker followed for OSA, complicated by obesity, HBP, CAD/MI, CABG, history SVT, ischemic CM, DM 2, morbid obesity CPAP 19/[facility] FOLLOWS FOR: [facility]. Pt wears CPAP nightly and DL attached. Pt is due for new CPAP per DME. Will need to order today.  He has not been able to keep his weight down. With weight gain he notices more shortness of breath without cough or wheeze. He also has symptoms seasonal spring and fall nasal congestion. CPAP download 100% compliance averaging 10-1/2 hours per night with AHI 19.1-mostly centrals.  01/18/2018- 72 year old male former smoker followed for OSA, complicated by obesity, HBP, CAD/MI, CABG, history SVT,CVA, ischemic CM, DM 2, morbid obesity Body weight today 314 pounds CPAP auto 5-20/[facility] Download 99% compliance AHI 27.3/hour with very high leak. He reports being very comfortable with his CPAP, sleeping with it every night. Had pneumonia vaccine at Dr. [employer] office in [place] sure which. Limited physical activity with stable dyspnea on exertion, little routine cough or wheeze.  Review of Systems- HPI + = positive Constitutional:   No-   weight loss, night sweats, fevers, chills, fatigue, lassitude. HEENT:   No-  headaches, difficulty swallowing, tooth/dental problems, sore throat,       No-  sneezing, itching, ear ache, nasal congestion, post nasal drip,  CV:  No-   chest pain, orthopnea, PND, swelling in lower extremities, anasarca, dizziness, palpitations Resp: + shortness of breath with exertion or at rest.              No-    productive cough,  No non-productive cough,  No-  coughing up of blood.              No-   change in color of mucus.  No- wheezing.   Skin: No-   rash or lesions. GI:  No-   heartburn, indigestion, abdominal pain, nausea, vomiting,  GU:  MS:  No-   joint pain or swelling.  + Diabetic foot ulcers Neuro- Psych:  No- change in mood or affect. No depression or anxiety.  No memory loss. Objective:   Physical Exam General- Alert, Oriented, Affect-appropriate, Distress- none acute   +Morbidly obese Skin- cleatr Lymphadenopathy- none Head- atraumatic            Eyes- Gross vision intact, PERRLA, conjunctivae clear secretions            Ears- Hearing, canals - normal             Nose-  No- Septal dev, mucus, polyps, erosion, perforation             Throat- Mallampati IV , mucosa clear , drainage- none, tonsils- atrophic   Neck- flexible , trachea midline, no stridor , thyroid nl, carotid no bruit Chest - symmetrical excursion , unlabored           Heart/CV- RRR occasional skipped beat ,  Murmur+2/LUSB , no gallop  , no rub, nl s1 s2                           - JVD- none , edema- 2+/ support hose, stasis changes-  none, varices- none           Lung- clear to P&A, wheeze- none, cough- none , dullness-none, rub- none           Chest wall-  Abd-  Br/ Gen/ Rectal- Not done, not indicated Extrem- cyanosis- none, clubbing, none, atrophy- none, strength- nl ,+ cane  Neuro- grossly intact to observation    Assessment & Plan:

## 2018-01-18 NOTE — Assessment & Plan Note (Signed)
Useful weight loss seems unlikely without a major change in lifestyle, but is encouraged.

## 2018-02-14 ENCOUNTER — Ambulatory Visit (INDEPENDENT_AMBULATORY_CARE_PROVIDER_SITE_OTHER): Payer: Medicare Other | Admitting: Podiatry

## 2018-02-14 ENCOUNTER — Encounter: Payer: Self-pay | Admitting: Podiatry

## 2018-02-14 VITALS — BP 112/50 | HR 74 | Resp 16

## 2018-02-14 DIAGNOSIS — L97821 Non-pressure chronic ulcer of other part of left lower leg limited to breakdown of skin: Secondary | ICD-10-CM

## 2018-02-14 DIAGNOSIS — M79676 Pain in unspecified toe(s): Secondary | ICD-10-CM

## 2018-02-14 DIAGNOSIS — I872 Venous insufficiency (chronic) (peripheral): Secondary | ICD-10-CM | POA: Diagnosis not present

## 2018-02-14 DIAGNOSIS — Z89411 Acquired absence of right great toe: Secondary | ICD-10-CM | POA: Diagnosis not present

## 2018-02-14 DIAGNOSIS — B351 Tinea unguium: Secondary | ICD-10-CM

## 2018-02-14 DIAGNOSIS — E1140 Type 2 diabetes mellitus with diabetic neuropathy, unspecified: Secondary | ICD-10-CM | POA: Diagnosis not present

## 2018-02-14 DIAGNOSIS — I83028 Varicose veins of left lower extremity with ulcer other part of lower leg: Secondary | ICD-10-CM

## 2018-02-14 NOTE — Progress Notes (Addendum)
Subjective:  Patient ID: Kerry Waters, male    DOB: 1945/12/30,  MRN: [id number]  Chief Complaint  Patient presents with  . debride    BL nail trimming -FBS: 120 x 1 wk A1C: 6.1 PCP: Kerry Waters x 2 mo    72 y.o. male presents  for diabetic foot care. Last AMBS was 120. New complaint of L leg wound, present for 2 weeks. Reports numbness and tingling in their feet. Denies cramping in legs and thighs.  Review of Systems: Negative except as noted in the HPI. Denies N/V/F/Ch.  Past Medical History:  Diagnosis Date  . Basal cell carcinoma   . Carotid stenosis    a. Carotid US (04/2012):  bilateral ICA 40-59%  . Coronary artery disease    a. s/p CABG 1993;  b  Lexiscan 2015  . Diabetes mellitus    Dr. Virgina Waters follows.  [name] History of MI (myocardial infarction) 1987   ANTERIOR SEPTAL  . History of stroke    occipital in 04/2012 tx at [facility]  . Hyperlipidemia   . Hypertension   . Ischemic cardiomyopathy    a.  Echo (01/08/2013): Mild LVH, EF 25-30%, AS akinesis, Inf HK, Ant HK, Gr 1 DD, Ao sclerosis, no AS, mod MAC, mild LAE.;   b.  Echo (05/10/13): EF 35-40%, anteroseptal, anterior, anterolateral and apical HK.  . NSTEMI (non-ST elevated myocardial infarction) ([place])    a. 12/2012 in setting of sepsis syndrome - EF 25-30% on echo; ? Type 2 NSTEMI; b. f/u echo 04/2013 with EF 35-40%; Myoview with inf and AL infarct and peri-infarct ischemia, EF 38% => Med Rx recommended  . Obesities, morbid ([place])   . Peripheral neuropathy   . PVD (peripheral vascular disease) ([place])   . Sepsis ([place]) 12/2012  . Sleep apnea    CPAP  . SVT (supraventricular tachycardia) ([place])    a. 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  . Toe osteomyelitis, right ([place])    a. s/p R great toe amputation  . Venous stasis     Current Outpatient Medications:  .  aspirin EC 81 MG tablet, Take 81 mg by mouth 2  (two) times daily. , Disp: , Rfl:  .  atorvastatin (LIPITOR) 80 MG tablet, Take 80 mg by mouth daily., Disp: , Rfl:  .  Coenzyme Q10 (COQ10 PO), Take 300 mg by mouth daily., Disp: , Rfl:  .  furosemide (LASIX) 20 MG tablet, Take 20 mg by mouth daily. , Disp: , Rfl:  .  hydrALAZINE (APRESOLINE) 25 MG tablet, Take 1 tablet (25 mg total) by mouth 3 (three) times daily., Disp: 90 tablet, Rfl: 5 .  HYDROcodone-acetaminophen (NORCO) 5-325 MG per tablet, Take 1-2 tablets by mouth every 4 (four) hours as needed for moderate pain., Disp: 40 tablet, Rfl: 0 .  insulin NPH-regular Human (NOVOLIN 70/30) (70-30) 100 UNIT/ML injection, Inject into the skin See admin instructions. 35 UNITS EVERY MORNING AND 25 UNITS EVERY EVENING WITH MEALS, Disp: , Rfl:  .  Krill Oil 500 MG CAPS, Take 1 capsule by mouth daily., Disp: , Rfl:  .  lisinopril (PRINIVIL,ZESTRIL) 40 MG tablet, Take 40 mg by mouth daily., Disp: , Rfl: 3 .  metFORMIN (GLUCOPHAGE) 500 MG tablet, Take 2 tablets every morning and 1 tablet every evening., Disp: , Rfl:  .  Multiple Vitamins-Minerals (PRESERVISION AREDS 2 PO), Take 1 tablet by mouth 2 (two) times  daily., Disp: , Rfl:  .  Multiple Vitamins-Minerals (PRESERVISION AREDS 2) CAPS, Take 1 capsule by mouth daily., Disp: , Rfl:  .  nitroGLYCERIN (NITROSTAT) 0.4 MG SL tablet, Place 1 tablet (0.4 mg total) under the tongue every 5 (five) minutes as needed for chest pain., Disp: 25 tablet, Rfl: 11 .  carvedilol (COREG) 12.5 MG tablet, Take 1 tablet (12.5 mg total) by mouth 2 (two) times daily., Disp: 60 tablet, Rfl: 3  Social History   Tobacco Use  Smoking Status Former Smoker  . Packs/day: 1.00  . Years: 30.00  . Pack years: 30.00  . Types: Cigarettes  . Last attempt to quit: 04/19/1985  . Years since quitting: 32.8  Smokeless Tobacco Never Used    No Known Allergies Objective:   Vitals:   02/14/18 1044  BP: (!) 112/50  Pulse: 74  Resp: 16   There is no height or weight on file to  calculate BMI. Constitutional Well developed. Well nourished.  Vascular Dorsalis pedis pulses present 1+ bilaterally  Posterior tibial pulses present 1+ bilaterally  Pedal hair growth diminished. Capillary refill normal to all digits.  No cyanosis or clubbing noted.  Neurologic Normal speech. Oriented to person, place, and time. Epicritic sensation to light touch grossly present bilaterally. Protective sensation with 5.07 monofilament  present bilaterally. Vibratory sensation present bilaterally.  Dermatologic Nails elongated, thickened, dystrophic. L leg VLU 1x1 post-debridement no warmth erythema signs of acute infection. No skin lesions.  Orthopedic: Normal joint ROM without pain or crepitus bilaterally. No visible deformities. No bony tenderness.   Assessment:   1. Type 2 diabetes mellitus with diabetic neuropathy, unspecified whether long term insulin use ([place])   2. History of amputation of right great toe ([place])   3. Onychomycosis   4. Venous (peripheral) insufficiency   5. Venous stasis ulcer of other part of left lower leg limited to breakdown of skin with varicose veins (HCC)    Plan:  Patient was evaluated and treated and all questions answered.  Diabetes with Amputation Hx, Onychomycosis -Educated on diabetic footcare. Diabetic risk level 3 -Nails x10 debrided sharply and manually with large nail nipper and rotary burr.   Procedure: Nail Debridement Rationale: Patient meets criteria for routine foot care due to Amputation hx Type of Debridement: manual, sharp debridement. Instrumentation: Nail nipper, rotary burr. Number of Nails: 9  L Leg VLU -Debrided as below  Procedure: Excisional Debridement of Wound Rationale: Removal of non-viable soft tissue from the wound to promote healing.  Anesthesia: none Pre-Debridement Wound Measurements: 0.8 cm x 0.8 cm x 0.3 cm  Post-Debridement Wound Measurements: 1 cm x 1 cm x 0.3 cm  Type of Debridement: Sharp  Excisional Tissue Removed: Non-viable soft tissue Depth of Debridement: subcutaneous tissue. Technique: Sharp excisional debridement to bleeding, viable wound base.  Dressing: Dry, sterile, compression dressing. Disposition: Patient tolerated procedure well. Patient to return in 1 week for follow-up.  Venous Insufficiency L -Multilayer compression dressing applied. -Advised to remove in 2 days.  Procedure: Multilayer Compression dressing Rationale: venous insufficiency Technique: Unna boot, cast padding, Coban compression dressing applied Disposition: Patient tolerated procedure well.   Return in about 2 weeks (around 02/28/2018) for Wound Care, Left.

## 2018-02-28 ENCOUNTER — Ambulatory Visit (INDEPENDENT_AMBULATORY_CARE_PROVIDER_SITE_OTHER): Payer: Medicare Other | Admitting: Podiatry

## 2018-02-28 ENCOUNTER — Encounter: Payer: Self-pay | Admitting: Podiatry

## 2018-02-28 VITALS — BP 103/55 | HR 64 | Temp 96.2°F | Resp 16

## 2018-02-28 DIAGNOSIS — L97821 Non-pressure chronic ulcer of other part of left lower leg limited to breakdown of skin: Secondary | ICD-10-CM

## 2018-02-28 DIAGNOSIS — I872 Venous insufficiency (chronic) (peripheral): Secondary | ICD-10-CM | POA: Diagnosis not present

## 2018-02-28 DIAGNOSIS — I83028 Varicose veins of left lower extremity with ulcer other part of lower leg: Secondary | ICD-10-CM

## 2018-02-28 NOTE — Progress Notes (Signed)
Subjective:  Patient ID: Kerry Waters, male    DOB: [birth year],  MRN: [id number]  Chief Complaint  Patient presents with  . Wound Check    F/U L ankle wound care: Pt. states," my wife says it looks better, it don't hurt me." Tx: neosporin and bandaid -pt denies N/V/F?Ch -w/ bloody drainage and redness -FBS: 200 A1C: 8 PCP: [name] x 1 wk    72 y.o. male presents  for diabetic foot care. History as above.  Review of Systems: Negative except as noted in the HPI. Denies N/V/F/Ch.  Past Medical History:  Diagnosis Date  . Basal cell carcinoma   . Carotid stenosis    a. Carotid US (04/2012):  bilateral ICA 40-59%  . Coronary artery disease    a. s/p CABG 1993;  b  Lexiscan 2015  . Diabetes mellitus    Dr. [name] follows.  [name] History of MI (myocardial infarction) 1987   ANTERIOR SEPTAL  . History of stroke    occipital in 04/2012 tx at [facility]  . Hyperlipidemia   . Hypertension   . Ischemic cardiomyopathy    a.  Echo (01/08/2013): Mild LVH, EF 25-30%, AS akinesis, Inf HK, Ant HK, Gr 1 DD, Ao sclerosis, no AS, mod MAC, mild LAE.;   b.  Echo (05/10/13): EF 35-40%, anteroseptal, anterior, anterolateral and apical HK.  . NSTEMI (non-ST elevated myocardial infarction) ([place])    a. 12/2012 in setting of sepsis syndrome - EF 25-30% on echo; ? Type 2 NSTEMI; b. f/u echo 04/2013 with EF 35-40%; Myoview with inf and AL infarct and peri-infarct ischemia, EF 38% => Med Rx recommended  . Obesities, morbid ([place])   . Peripheral neuropathy   . PVD (peripheral vascular disease) ([place])   . Sepsis ([place]) 12/2012  . Sleep apnea    CPAP  . SVT (supraventricular tachycardia) ([place])    a. 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  . Toe osteomyelitis, right ([place])    a. s/p R great toe amputation  . Venous stasis     Current Outpatient Medications:  .  aspirin EC 81 MG tablet, Take 81 mg by  mouth 2 (two) times daily. , Disp: , Rfl:  .  atorvastatin (LIPITOR) 80 MG tablet, Take 80 mg by mouth daily., Disp: , Rfl:  .  Coenzyme Q10 (COQ10 PO), Take 300 mg by mouth daily., Disp: , Rfl:  .  furosemide (LASIX) 20 MG tablet, Take 20 mg by mouth daily. , Disp: , Rfl:  .  hydrALAZINE (APRESOLINE) 25 MG tablet, Take 1 tablet (25 mg total) by mouth 3 (three) times daily., Disp: 90 tablet, Rfl: 5 .  HYDROcodone-acetaminophen (NORCO) 5-325 MG per tablet, Take 1-2 tablets by mouth every 4 (four) hours as needed for moderate pain., Disp: 40 tablet, Rfl: 0 .  insulin NPH-regular Human (NOVOLIN 70/30) (70-30) 100 UNIT/ML injection, Inject into the skin See admin instructions. 35 UNITS EVERY MORNING AND 25 UNITS EVERY EVENING WITH MEALS, Disp: , Rfl:  .  Krill Oil 500 MG CAPS, Take 1 capsule by mouth daily., Disp: , Rfl:  .  lisinopril (PRINIVIL,ZESTRIL) 40 MG tablet, Take 40 mg by mouth daily., Disp: , Rfl: 3 .  metFORMIN (GLUCOPHAGE) 500 MG tablet, Take 2 tablets every morning and 1 tablet every evening., Disp: , Rfl:  .  Multiple Vitamins-Minerals (PRESERVISION AREDS 2 PO), Take 1 tablet by mouth 2 (two) times  daily., Disp: , Rfl:  .  Multiple Vitamins-Minerals (PRESERVISION AREDS 2) CAPS, Take 1 capsule by mouth daily., Disp: , Rfl:  .  nitroGLYCERIN (NITROSTAT) 0.4 MG SL tablet, Place 1 tablet (0.4 mg total) under the tongue every 5 (five) minutes as needed for chest pain., Disp: 25 tablet, Rfl: 11 .  carvedilol (COREG) 12.5 MG tablet, Take 1 tablet (12.5 mg total) by mouth 2 (two) times daily., Disp: 60 tablet, Rfl: 3  Social History   Tobacco Use  Smoking Status Former Smoker  . Packs/day: 1.00  . Years: 30.00  . Pack years: 30.00  . Types: Cigarettes  . Last attempt to quit: 04/19/1985  . Years since quitting: 32.8  Smokeless Tobacco Never Used    No Known Allergies Objective:   Vitals:   02/28/18 1028  BP: (!) 103/55  Pulse: 64  Resp: 16  Temp: (!) 96.2 F (35.7 C)   There  is no height or weight on file to calculate BMI. Constitutional Well developed. Well nourished.  Vascular Dorsalis pedis pulses present 1+ bilaterally  Posterior tibial pulses present 1+ bilaterally  Pedal hair growth diminished. Capillary refill normal to all digits.  No cyanosis or clubbing noted.  Neurologic Normal speech. Oriented to person, place, and time. Epicritic sensation to light touch grossly present bilaterally. Protective sensation with 5.07 monofilament  present bilaterally. Vibratory sensation present bilaterally.  Dermatologic Nails elongated, thickened, dystrophic. L leg VLU 1x1 No skin lesions.  Orthopedic: Normal joint ROM without pain or crepitus bilaterally. No visible deformities. No bony tenderness.   Assessment:   1. Venous stasis ulcer of other part of left lower leg limited to breakdown of skin with varicose veins (HCC)   2. Venous (peripheral) insufficiency    Plan:  Patient was evaluated and treated and all questions answered.  L Leg VLU -Selective debridement as below.  Procedure: Selective Debridement of Wound Rationale: Removal of devitalized tissue from the wound to promote healing.  Pre-Debridement Wound Measurements: 1 cm x 1 cm x 0.1 cm  Post-Debridement Wound Measurements: same as pre-debridement. Type of Debridement: sharp selective Tissue Removed: Devitalized soft-tissue Dressing: Dry, sterile, compression dressing. Disposition: Patient tolerated procedure well. Patient to return in 1 week for follow-up.   Venous Insufficiency L -Multilayer compression dressing applied.  Procedure: Multilayer Compression dressing Rationale: venous insufficiency Technique: Unna boot, cast padding, Coban compression dressing applied Disposition: Patient tolerated procedure well.    Return in about 2 weeks (around 03/14/2018) for VLU left ankle.

## 2018-03-14 ENCOUNTER — Encounter: Payer: Self-pay | Admitting: Podiatry

## 2018-03-14 ENCOUNTER — Ambulatory Visit (INDEPENDENT_AMBULATORY_CARE_PROVIDER_SITE_OTHER): Payer: Medicare Other | Admitting: Podiatry

## 2018-03-14 VITALS — BP 122/59 | HR 97 | Resp 16

## 2018-03-14 DIAGNOSIS — I83028 Varicose veins of left lower extremity with ulcer other part of lower leg: Secondary | ICD-10-CM | POA: Diagnosis not present

## 2018-03-14 DIAGNOSIS — I872 Venous insufficiency (chronic) (peripheral): Secondary | ICD-10-CM | POA: Diagnosis not present

## 2018-03-14 DIAGNOSIS — L97821 Non-pressure chronic ulcer of other part of left lower leg limited to breakdown of skin: Secondary | ICD-10-CM | POA: Diagnosis not present

## 2018-03-15 NOTE — Progress Notes (Signed)
Subjective:  Patient ID: Kerry Waters, male    DOB: 11/13/1945,  MRN: [id number]  Chief Complaint  Patient presents with  . VLU    F/U L VLU Pt. states," wife says it's looking better, but still not healed, no pain." Tx: abx cream, and bandaid -pt denies N/F/V/Ch -w/ light yellow discharge -FBS: 140 A1C: 8 PCP: [name] x 3 wks    72 y.o. male presents  for diabetic foot care. History as above.  Review of Systems: Negative except as noted in the HPI. Denies N/V/F/Ch.  Past Medical History:  Diagnosis Date  . Basal cell carcinoma   . Carotid stenosis    a. Carotid US (04/2012):  bilateral ICA 40-59%  . Coronary artery disease    a. s/p CABG 1993;  b  Lexiscan 2015  . Diabetes mellitus    Dr. [name] follows.  [name] History of MI (myocardial infarction) 1987   ANTERIOR SEPTAL  . History of stroke    occipital in 04/2012 tx at [facility]  . Hyperlipidemia   . Hypertension   . Ischemic cardiomyopathy    a.  Echo (01/08/2013): Mild LVH, EF 25-30%, AS akinesis, Inf HK, Ant HK, Gr 1 DD, Ao sclerosis, no AS, mod MAC, mild LAE.;   b.  Echo (05/10/13): EF 35-40%, anteroseptal, anterior, anterolateral and apical HK.  . NSTEMI (non-ST elevated myocardial infarction) ([place])    a. 12/2012 in setting of sepsis syndrome - EF 25-30% on echo; ? Type 2 NSTEMI; b. f/u echo 04/2013 with EF 35-40%; Myoview with inf and AL infarct and peri-infarct ischemia, EF 38% => Med Rx recommended  . Obesities, morbid ([place])   . Peripheral neuropathy   . PVD (peripheral vascular disease) ([place])   . Sepsis ([place]) 12/2012  . Sleep apnea    CPAP  . SVT (supraventricular tachycardia) ([place])    a. 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  . Toe osteomyelitis, right ([place])    a. s/p R great toe amputation  . Venous stasis     Current Outpatient Medications:  .  aspirin EC 81 MG tablet, Take 81 mg by mouth 2 (two)  times daily. , Disp: , Rfl:  .  atorvastatin (LIPITOR) 80 MG tablet, Take 80 mg by mouth daily., Disp: , Rfl:  .  Coenzyme Q10 (COQ10 PO), Take 300 mg by mouth daily., Disp: , Rfl:  .  furosemide (LASIX) 20 MG tablet, Take 20 mg by mouth daily. , Disp: , Rfl:  .  hydrALAZINE (APRESOLINE) 25 MG tablet, Take 1 tablet (25 mg total) by mouth 3 (three) times daily., Disp: 90 tablet, Rfl: 5 .  HYDROcodone-acetaminophen (NORCO) 5-325 MG per tablet, Take 1-2 tablets by mouth every 4 (four) hours as needed for moderate pain., Disp: 40 tablet, Rfl: 0 .  insulin NPH-regular Human (NOVOLIN 70/30) (70-30) 100 UNIT/ML injection, Inject into the skin See admin instructions. 35 UNITS EVERY MORNING AND 25 UNITS EVERY EVENING WITH MEALS, Disp: , Rfl:  .  Krill Oil 500 MG CAPS, Take 1 capsule by mouth daily., Disp: , Rfl:  .  lisinopril (PRINIVIL,ZESTRIL) 40 MG tablet, Take 40 mg by mouth daily., Disp: , Rfl: 3 .  metFORMIN (GLUCOPHAGE) 500 MG tablet, Take 2 tablets every morning and 1 tablet every evening., Disp: , Rfl:  .  Multiple Vitamins-Minerals (PRESERVISION AREDS 2 PO), Take 1 tablet by mouth 2 (two) times daily., Disp: ,  Rfl:  .  Multiple Vitamins-Minerals (PRESERVISION AREDS 2) CAPS, Take 1 capsule by mouth daily., Disp: , Rfl:  .  nitroGLYCERIN (NITROSTAT) 0.4 MG SL tablet, Place 1 tablet (0.4 mg total) under the tongue every 5 (five) minutes as needed for chest pain., Disp: 25 tablet, Rfl: 11 .  carvedilol (COREG) 12.5 MG tablet, Take 1 tablet (12.5 mg total) by mouth 2 (two) times daily., Disp: 60 tablet, Rfl: 3  Social History   Tobacco Use  Smoking Status Former Smoker  . Packs/day: 1.00  . Years: 30.00  . Pack years: 30.00  . Types: Cigarettes  . Last attempt to quit: 04/19/1985  . Years since quitting: 32.9  Smokeless Tobacco Never Used    No Known Allergies Objective:   Vitals:   03/14/18 1100  BP: (!) 122/59  Pulse: 97  Resp: 16   There is no height or weight on file to calculate  BMI. Constitutional Well developed. Well nourished.  Vascular Dorsalis pedis pulses present 1+ bilaterally  Posterior tibial pulses present 1+ bilaterally  Pedal hair growth diminished. Capillary refill normal to all digits.  No cyanosis or clubbing noted.  Neurologic Normal speech. Oriented to person, place, and time. Epicritic sensation to light touch grossly present bilaterally. Protective sensation with 5.07 monofilament  present bilaterally. Vibratory sensation present bilaterally.  Dermatologic Nails elongated, thickened, dystrophic. L leg VLU 1.1.5 with fibrotic base No skin lesions.  Orthopedic: Normal joint ROM without pain or crepitus bilaterally. No visible deformities. No bony tenderness.   Assessment:   1. Venous stasis ulcer of other part of left lower leg limited to breakdown of skin with varicose veins (HCC)   2. Venous (peripheral) insufficiency    Plan:  Patient was evaluated and treated and all questions answered.  L Leg VLU -Selective debridement as below.  Procedure: Selective Debridement of Wound Rationale: Removal of devitalized tissue from the wound to promote healing.  Pre-Debridement Wound Measurements: 1 cm x 1.5 cm x 0.1 cm  Post-Debridement Wound Measurements: same as pre-debridement. Type of Debridement: sharp selective Tissue Removed: Devitalized soft-tissue Dressing: Dry, sterile, compression dressing. Disposition: Patient tolerated procedure well. Patient to return in 1 week for follow-up.   Venous Insufficiency L -Multilayer compression dressing applied.  Procedure: Multilayer Compression dressing Rationale: venous insufficiency Technique: Unna boot, cast padding, Coban compression dressing applied Disposition: Patient tolerated procedure well.     Return in about 2 weeks (around 03/28/2018) for vlu ff/u.

## 2018-03-23 ENCOUNTER — Other Ambulatory Visit: Payer: Medicare Other | Admitting: *Deleted

## 2018-03-28 ENCOUNTER — Encounter: Payer: Self-pay | Admitting: Podiatry

## 2018-03-28 ENCOUNTER — Telehealth: Payer: Self-pay | Admitting: *Deleted

## 2018-03-28 ENCOUNTER — Ambulatory Visit (INDEPENDENT_AMBULATORY_CARE_PROVIDER_SITE_OTHER): Payer: Medicare Other | Admitting: Podiatry

## 2018-03-28 VITALS — BP 119/59 | HR 67 | Temp 96.9°F | Resp 16

## 2018-03-28 DIAGNOSIS — I872 Venous insufficiency (chronic) (peripheral): Secondary | ICD-10-CM

## 2018-03-28 DIAGNOSIS — I83028 Varicose veins of left lower extremity with ulcer other part of lower leg: Secondary | ICD-10-CM

## 2018-03-28 DIAGNOSIS — L97821 Non-pressure chronic ulcer of other part of left lower leg limited to breakdown of skin: Secondary | ICD-10-CM

## 2018-03-28 DIAGNOSIS — Z89411 Acquired absence of right great toe: Secondary | ICD-10-CM

## 2018-03-28 DIAGNOSIS — E1140 Type 2 diabetes mellitus with diabetic neuropathy, unspecified: Secondary | ICD-10-CM

## 2018-03-28 NOTE — Progress Notes (Signed)
Subjective:  Patient ID: Kerry Waters, male    DOB: 11/13/1945,  MRN: [id number]  Chief Complaint  Patient presents with  . VLU    F/U L VLU Pt. states," wife says it's better, but I can't see it, just sore." tx: abx cream and bandaid -pt denies N/V/F/CH -very little drainage and redness -FBS: 150 x 1 day A1C: 8    72 y.o. male presents  for diabetic foot care. History as above.  Review of Systems: Negative except as noted in the HPI. Denies N/V/F/Ch.  Past Medical History:  Diagnosis Date  . Basal cell carcinoma   . Carotid stenosis    a. Carotid US (04/2012):  bilateral ICA 40-59%  . Coronary artery disease    a. s/p CABG 1993;  b  Lexiscan 2015  . Diabetes mellitus    Dr. [name] follows.  [name] History of MI (myocardial infarction) 1987   ANTERIOR SEPTAL  . History of stroke    occipital in 04/2012 tx at [facility]  . Hyperlipidemia   . Hypertension   . Ischemic cardiomyopathy    a.  Echo (01/08/2013): Mild LVH, EF 25-30%, AS akinesis, Inf HK, Ant HK, Gr 1 DD, Ao sclerosis, no AS, mod MAC, mild LAE.;   b.  Echo (05/10/13): EF 35-40%, anteroseptal, anterior, anterolateral and apical HK.  . NSTEMI (non-ST elevated myocardial infarction) ([place])    a. 12/2012 in setting of sepsis syndrome - EF 25-30% on echo; ? Type 2 NSTEMI; b. f/u echo 04/2013 with EF 35-40%; Myoview with inf and AL infarct and peri-infarct ischemia, EF 38% => Med Rx recommended  . Obesities, morbid ([place])   . Peripheral neuropathy   . PVD (peripheral vascular disease) ([place])   . Sepsis ([place]) 12/2012  . Sleep apnea    CPAP  . SVT (supraventricular tachycardia) ([place])    a. 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  . Toe osteomyelitis, right ([place])    a. s/p R great toe amputation  . Venous stasis     Current Outpatient Medications:  .  aspirin EC 81 MG tablet, Take 81 mg by mouth 2 (two) times daily.  , Disp: , Rfl:  .  atorvastatin (LIPITOR) 80 MG tablet, Take 80 mg by mouth daily., Disp: , Rfl:  .  Coenzyme Q10 (COQ10 PO), Take 300 mg by mouth daily., Disp: , Rfl:  .  furosemide (LASIX) 20 MG tablet, Take 20 mg by mouth daily. , Disp: , Rfl:  .  hydrALAZINE (APRESOLINE) 25 MG tablet, Take 1 tablet (25 mg total) by mouth 3 (three) times daily., Disp: 90 tablet, Rfl: 5 .  HYDROcodone-acetaminophen (NORCO) 5-325 MG per tablet, Take 1-2 tablets by mouth every 4 (four) hours as needed for moderate pain., Disp: 40 tablet, Rfl: 0 .  insulin NPH-regular Human (NOVOLIN 70/30) (70-30) 100 UNIT/ML injection, Inject into the skin See admin instructions. 35 UNITS EVERY MORNING AND 25 UNITS EVERY EVENING WITH MEALS, Disp: , Rfl:  .  Krill Oil 500 MG CAPS, Take 1 capsule by mouth daily., Disp: , Rfl:  .  lisinopril (PRINIVIL,ZESTRIL) 40 MG tablet, Take 40 mg by mouth daily., Disp: , Rfl: 3 .  metFORMIN (GLUCOPHAGE) 500 MG tablet, Take 2 tablets every morning and 1 tablet every evening., Disp: , Rfl:  .  Multiple Vitamins-Minerals (PRESERVISION AREDS 2 PO), Take 1 tablet by mouth 2 (two) times daily., Disp: ,  Rfl:  .  Multiple Vitamins-Minerals (PRESERVISION AREDS 2) CAPS, Take 1 capsule by mouth daily., Disp: , Rfl:  .  nitroGLYCERIN (NITROSTAT) 0.4 MG SL tablet, Place 1 tablet (0.4 mg total) under the tongue every 5 (five) minutes as needed for chest pain., Disp: 25 tablet, Rfl: 11 .  carvedilol (COREG) 12.5 MG tablet, Take 1 tablet (12.5 mg total) by mouth 2 (two) times daily., Disp: 60 tablet, Rfl: 3  Social History   Tobacco Use  Smoking Status Former Smoker  . Packs/day: 1.00  . Years: 30.00  . Pack years: 30.00  . Types: Cigarettes  . Last attempt to quit: 04/19/1985  . Years since quitting: 32.9  Smokeless Tobacco Never Used    No Known Allergies Objective:   Vitals:   03/28/18 1100  BP: (!) 119/59  Pulse: 67  Resp: 16  Temp: (!) 96.9 F (36.1 C)   There is no height or weight on  file to calculate BMI. Constitutional Well developed. Well nourished.  Vascular Dorsalis pedis pulses present 1+ bilaterally  Posterior tibial pulses present 1+ bilaterally  Pedal hair growth diminished. Capillary refill normal to all digits.  No cyanosis or clubbing noted.  Neurologic Normal speech. Oriented to person, place, and time. Epicritic sensation to light touch grossly present bilaterally. Protective sensation with 5.07 monofilament  present bilaterally. Vibratory sensation present bilaterally.  Dermatologic Nails elongated, thickened, dystrophic. L leg VLU 1.1.5 with fibrotic base No skin lesions.  Orthopedic: Normal joint ROM without pain or crepitus bilaterally. No visible deformities. No bony tenderness.   Assessment:   1. Venous stasis ulcer of other part of left lower leg limited to breakdown of skin with varicose veins (HCC)   2. Venous (peripheral) insufficiency    Plan:  Patient was evaluated and treated and all questions answered.  L Leg VLU -Selective debridement as below. -Will refer to wound care center as wound has not progressed with standard care.  Procedure: Selective Debridement of Wound Rationale: Removal of devitalized tissue from the wound to promote healing.  Pre-Debridement Wound Measurements: 1 cm x 1.5 cm x 0.2 cm  Post-Debridement Wound Measurements: same as pre-debridement. Type of Debridement: sharp selective Tissue Removed: Devitalized soft-tissue Dressing: Dry, sterile, compression dressing. Disposition: Patient tolerated procedure well. Patient to return in 1 week for follow-up.   Venous Insufficiency L -Multilayer compression dressing applied.  Procedure: Multilayer Compression dressing Rationale: venous insufficiency Technique: Unna boot, cast padding, Coban compression dressing applied Disposition: Patient tolerated procedure well.    No follow-ups on file.

## 2018-03-28 NOTE — Telephone Encounter (Signed)
-----   Message from Evelina Bucy, DPM sent at 03/28/2018 11:10 AM EST ----- Can we refer to wound care center in [place]?

## 2018-03-28 NOTE — Telephone Encounter (Signed)
Faxed referral, clinicals and demographics to [facility].

## 2018-03-31 DIAGNOSIS — Z87891 Personal history of nicotine dependence: Secondary | ICD-10-CM | POA: Diagnosis not present

## 2018-03-31 DIAGNOSIS — I252 Old myocardial infarction: Secondary | ICD-10-CM | POA: Diagnosis not present

## 2018-03-31 DIAGNOSIS — I5030 Unspecified diastolic (congestive) heart failure: Secondary | ICD-10-CM | POA: Diagnosis not present

## 2018-03-31 DIAGNOSIS — E11622 Type 2 diabetes mellitus with other skin ulcer: Secondary | ICD-10-CM | POA: Diagnosis not present

## 2018-03-31 DIAGNOSIS — L97322 Non-pressure chronic ulcer of left ankle with fat layer exposed: Secondary | ICD-10-CM | POA: Diagnosis not present

## 2018-03-31 DIAGNOSIS — G4730 Sleep apnea, unspecified: Secondary | ICD-10-CM | POA: Diagnosis not present

## 2018-03-31 DIAGNOSIS — M109 Gout, unspecified: Secondary | ICD-10-CM | POA: Diagnosis not present

## 2018-03-31 DIAGNOSIS — I110 Hypertensive heart disease with heart failure: Secondary | ICD-10-CM | POA: Diagnosis not present

## 2018-03-31 DIAGNOSIS — Z794 Long term (current) use of insulin: Secondary | ICD-10-CM | POA: Diagnosis not present

## 2018-03-31 DIAGNOSIS — M1990 Unspecified osteoarthritis, unspecified site: Secondary | ICD-10-CM | POA: Diagnosis not present

## 2018-03-31 DIAGNOSIS — I87312 Chronic venous hypertension (idiopathic) with ulcer of left lower extremity: Secondary | ICD-10-CM | POA: Diagnosis not present

## 2018-03-31 DIAGNOSIS — E1140 Type 2 diabetes mellitus with diabetic neuropathy, unspecified: Secondary | ICD-10-CM | POA: Diagnosis not present

## 2018-03-31 DIAGNOSIS — Z85828 Personal history of other malignant neoplasm of skin: Secondary | ICD-10-CM | POA: Diagnosis not present

## 2018-03-31 DIAGNOSIS — I2510 Atherosclerotic heart disease of native coronary artery without angina pectoris: Secondary | ICD-10-CM | POA: Diagnosis not present

## 2018-03-31 DIAGNOSIS — E1151 Type 2 diabetes mellitus with diabetic peripheral angiopathy without gangrene: Secondary | ICD-10-CM | POA: Diagnosis not present

## 2018-04-07 DIAGNOSIS — I87312 Chronic venous hypertension (idiopathic) with ulcer of left lower extremity: Secondary | ICD-10-CM | POA: Diagnosis not present

## 2018-04-07 DIAGNOSIS — L97322 Non-pressure chronic ulcer of left ankle with fat layer exposed: Secondary | ICD-10-CM | POA: Diagnosis not present

## 2018-04-14 DIAGNOSIS — L97322 Non-pressure chronic ulcer of left ankle with fat layer exposed: Secondary | ICD-10-CM | POA: Diagnosis not present

## 2018-04-14 DIAGNOSIS — I87312 Chronic venous hypertension (idiopathic) with ulcer of left lower extremity: Secondary | ICD-10-CM | POA: Diagnosis not present

## 2018-04-21 DIAGNOSIS — I87312 Chronic venous hypertension (idiopathic) with ulcer of left lower extremity: Secondary | ICD-10-CM | POA: Diagnosis not present

## 2018-04-21 DIAGNOSIS — I5030 Unspecified diastolic (congestive) heart failure: Secondary | ICD-10-CM | POA: Diagnosis not present

## 2018-04-21 DIAGNOSIS — L97322 Non-pressure chronic ulcer of left ankle with fat layer exposed: Secondary | ICD-10-CM | POA: Diagnosis not present

## 2018-04-21 DIAGNOSIS — E119 Type 2 diabetes mellitus without complications: Secondary | ICD-10-CM | POA: Diagnosis not present

## 2018-04-25 DIAGNOSIS — I70223 Atherosclerosis of native arteries of extremities with rest pain, bilateral legs: Secondary | ICD-10-CM | POA: Diagnosis not present

## 2018-04-25 DIAGNOSIS — I87312 Chronic venous hypertension (idiopathic) with ulcer of left lower extremity: Secondary | ICD-10-CM | POA: Diagnosis not present

## 2018-04-28 DIAGNOSIS — I872 Venous insufficiency (chronic) (peripheral): Secondary | ICD-10-CM | POA: Diagnosis not present

## 2018-04-28 DIAGNOSIS — I5030 Unspecified diastolic (congestive) heart failure: Secondary | ICD-10-CM | POA: Diagnosis not present

## 2018-04-28 DIAGNOSIS — L97322 Non-pressure chronic ulcer of left ankle with fat layer exposed: Secondary | ICD-10-CM | POA: Diagnosis not present

## 2018-04-28 DIAGNOSIS — L97812 Non-pressure chronic ulcer of other part of right lower leg with fat layer exposed: Secondary | ICD-10-CM | POA: Diagnosis not present

## 2018-04-28 DIAGNOSIS — I87313 Chronic venous hypertension (idiopathic) with ulcer of bilateral lower extremity: Secondary | ICD-10-CM | POA: Diagnosis not present

## 2018-04-28 DIAGNOSIS — E1151 Type 2 diabetes mellitus with diabetic peripheral angiopathy without gangrene: Secondary | ICD-10-CM | POA: Diagnosis not present

## 2018-05-05 DIAGNOSIS — I5030 Unspecified diastolic (congestive) heart failure: Secondary | ICD-10-CM | POA: Diagnosis not present

## 2018-05-05 DIAGNOSIS — L97322 Non-pressure chronic ulcer of left ankle with fat layer exposed: Secondary | ICD-10-CM | POA: Diagnosis not present

## 2018-05-05 DIAGNOSIS — I87313 Chronic venous hypertension (idiopathic) with ulcer of bilateral lower extremity: Secondary | ICD-10-CM | POA: Diagnosis not present

## 2018-05-05 DIAGNOSIS — E119 Type 2 diabetes mellitus without complications: Secondary | ICD-10-CM | POA: Diagnosis not present

## 2018-05-05 DIAGNOSIS — L97812 Non-pressure chronic ulcer of other part of right lower leg with fat layer exposed: Secondary | ICD-10-CM | POA: Diagnosis not present

## 2018-05-12 DIAGNOSIS — I5030 Unspecified diastolic (congestive) heart failure: Secondary | ICD-10-CM | POA: Diagnosis not present

## 2018-05-12 DIAGNOSIS — E119 Type 2 diabetes mellitus without complications: Secondary | ICD-10-CM | POA: Diagnosis not present

## 2018-05-12 DIAGNOSIS — L97322 Non-pressure chronic ulcer of left ankle with fat layer exposed: Secondary | ICD-10-CM | POA: Diagnosis not present

## 2018-05-12 DIAGNOSIS — I87312 Chronic venous hypertension (idiopathic) with ulcer of left lower extremity: Secondary | ICD-10-CM | POA: Diagnosis not present

## 2018-05-15 ENCOUNTER — Other Ambulatory Visit: Payer: Self-pay | Admitting: General Surgery

## 2018-05-15 ENCOUNTER — Encounter: Payer: Self-pay | Admitting: Podiatry

## 2018-05-15 ENCOUNTER — Ambulatory Visit (INDEPENDENT_AMBULATORY_CARE_PROVIDER_SITE_OTHER): Payer: Medicare Other | Admitting: Podiatry

## 2018-05-15 VITALS — BP 127/57 | HR 68 | Resp 16

## 2018-05-15 DIAGNOSIS — E1142 Type 2 diabetes mellitus with diabetic polyneuropathy: Secondary | ICD-10-CM | POA: Diagnosis not present

## 2018-05-15 DIAGNOSIS — I773 Arterial fibromuscular dysplasia: Secondary | ICD-10-CM

## 2018-05-15 DIAGNOSIS — B351 Tinea unguium: Secondary | ICD-10-CM | POA: Diagnosis not present

## 2018-05-15 DIAGNOSIS — E1169 Type 2 diabetes mellitus with other specified complication: Secondary | ICD-10-CM | POA: Diagnosis not present

## 2018-05-15 NOTE — Progress Notes (Signed)
Subjective:  Patient ID: Kerry Waters, male    DOB: April 03, 1946,  MRN: [id number]  Chief Complaint  Patient presents with  . debride    BL nail trimming -No complaintsd  . Diabetes    FBS: 120 x 2 days A1C: 9 PCP: [name]    73 y.o. male presents  for diabetic foot care. History as above.  Review of Systems: Negative except as noted in the HPI. Denies N/V/F/Ch.  Past Medical History:  Diagnosis Date  . Basal cell carcinoma   . Carotid stenosis    a. Carotid US (04/2012):  bilateral ICA 40-59%  . Coronary artery disease    a. s/p CABG 1993;  b  Lexiscan 2015  . Diabetes mellitus    Dr. [name] follows.  [name] History of MI (myocardial infarction) 1987   ANTERIOR SEPTAL  . History of stroke    occipital in 04/2012 tx at [facility]  . Hyperlipidemia   . Hypertension   . Ischemic cardiomyopathy    a.  Echo (01/08/2013): Mild LVH, EF 25-30%, AS akinesis, Inf HK, Ant HK, Gr 1 DD, Ao sclerosis, no AS, mod MAC, mild LAE.;   b.  Echo (05/10/13): EF 35-40%, anteroseptal, anterior, anterolateral and apical HK.  . NSTEMI (non-ST elevated myocardial infarction) ([place])    a. 12/2012 in setting of sepsis syndrome - EF 25-30% on echo; ? Type 2 NSTEMI; b. f/u echo 04/2013 with EF 35-40%; Myoview with inf and AL infarct and peri-infarct ischemia, EF 38% => Med Rx recommended  . Obesities, morbid ([place])   . Peripheral neuropathy   . PVD (peripheral vascular disease) ([place])   . Sepsis ([place]) 12/2012  . Sleep apnea    CPAP  . SVT (supraventricular tachycardia) ([place])    a. 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  . Toe osteomyelitis, right ([place])    a. s/p R great toe amputation  . Venous stasis     Current Outpatient Medications:  .  aspirin EC 81 MG tablet, Take 81 mg by mouth 2 (two) times daily. , Disp: , Rfl:  .  atorvastatin (LIPITOR) 80 MG tablet, Take 80 mg by mouth daily., Disp: ,  Rfl:  .  Coenzyme Q10 (COQ10 PO), Take 300 mg by mouth daily., Disp: , Rfl:  .  furosemide (LASIX) 20 MG tablet, Take 20 mg by mouth daily. , Disp: , Rfl:  .  hydrALAZINE (APRESOLINE) 25 MG tablet, Take 1 tablet (25 mg total) by mouth 3 (three) times daily., Disp: 90 tablet, Rfl: 5 .  HYDROcodone-acetaminophen (NORCO) 5-325 MG per tablet, Take 1-2 tablets by mouth every 4 (four) hours as needed for moderate pain., Disp: 40 tablet, Rfl: 0 .  insulin NPH-regular Human (NOVOLIN 70/30) (70-30) 100 UNIT/ML injection, Inject into the skin See admin instructions. 35 UNITS EVERY MORNING AND 25 UNITS EVERY EVENING WITH MEALS, Disp: , Rfl:  .  Krill Oil 500 MG CAPS, Take 1 capsule by mouth daily., Disp: , Rfl:  .  lisinopril (PRINIVIL,ZESTRIL) 40 MG tablet, Take 40 mg by mouth daily., Disp: , Rfl: 3 .  metFORMIN (GLUCOPHAGE) 500 MG tablet, Take 2 tablets every morning and 1 tablet every evening., Disp: , Rfl:  .  Multiple Vitamins-Minerals (PRESERVISION AREDS 2 PO), Take 1 tablet by mouth 2 (two) times daily., Disp: , Rfl:  .  Multiple Vitamins-Minerals (PRESERVISION AREDS 2) CAPS, Take 1 capsule by mouth daily.,  Disp: , Rfl:  .  nitroGLYCERIN (NITROSTAT) 0.4 MG SL tablet, Place 1 tablet (0.4 mg total) under the tongue every 5 (five) minutes as needed for chest pain., Disp: 25 tablet, Rfl: 11 .  carvedilol (COREG) 12.5 MG tablet, Take 1 tablet (12.5 mg total) by mouth 2 (two) times daily., Disp: 60 tablet, Rfl: 3  Social History   Tobacco Use  Smoking Status Former Smoker  . Packs/day: 1.00  . Years: 30.00  . Pack years: 30.00  . Types: Cigarettes  . Last attempt to quit: 04/19/1985  . Years since quitting: 33.0  Smokeless Tobacco Never Used    No Known Allergies Objective:   Vitals:   05/15/18 1112  BP: (!) 127/57  Pulse: 68  Resp: 16   There is no height or weight on file to calculate BMI. Constitutional Well developed. Well nourished.  Vascular Dorsalis pedis pulses present 1+  bilaterally  Posterior tibial pulses present 1+ bilaterally  Pedal hair growth diminished. Capillary refill normal to all digits.  No cyanosis or clubbing noted.  Neurologic Normal speech. Oriented to person, place, and time. Epicritic sensation to light touch grossly present bilaterally. Protective sensation with 5.07 monofilament  present bilaterally. Vibratory sensation present bilaterally.  Dermatologic Nails elongated, thickened, dystrophic. L Leg VLU - dressing left intact. No skin lesions.  Orthopedic: Normal joint ROM without pain or crepitus bilaterally. No visible deformities. No bony tenderness.   Assessment:   1. Onychomycosis of multiple toenails with type 2 diabetes mellitus and peripheral neuropathy ([place])    Plan:  Patient was evaluated and treated and all questions answered.  DM with R Hallux Amputation -DM risk 3 -Nails debrided x9  Procedure: Nail Debridement Rationale: Patient meets criteria for routine foot care due to Amputation Hx Type of Debridement: manual, sharp debridement. Instrumentation: Nail nipper, rotary burr. Number of Nails: 9  Return in about 9 weeks (around 07/17/2018) for Diabetic Foot Care.

## 2018-05-16 ENCOUNTER — Other Ambulatory Visit (HOSPITAL_COMMUNITY): Payer: Self-pay | Admitting: General Surgery

## 2018-05-16 DIAGNOSIS — I773 Arterial fibromuscular dysplasia: Secondary | ICD-10-CM

## 2018-05-19 DIAGNOSIS — I87312 Chronic venous hypertension (idiopathic) with ulcer of left lower extremity: Secondary | ICD-10-CM | POA: Diagnosis not present

## 2018-05-19 DIAGNOSIS — I96 Gangrene, not elsewhere classified: Secondary | ICD-10-CM | POA: Diagnosis not present

## 2018-05-19 DIAGNOSIS — I5030 Unspecified diastolic (congestive) heart failure: Secondary | ICD-10-CM | POA: Diagnosis not present

## 2018-05-19 DIAGNOSIS — L97322 Non-pressure chronic ulcer of left ankle with fat layer exposed: Secondary | ICD-10-CM | POA: Diagnosis not present

## 2018-05-19 DIAGNOSIS — E1152 Type 2 diabetes mellitus with diabetic peripheral angiopathy with gangrene: Secondary | ICD-10-CM | POA: Diagnosis not present

## 2018-05-29 ENCOUNTER — Ambulatory Visit (HOSPITAL_COMMUNITY)
Admission: RE | Admit: 2018-05-29 | Discharge: 2018-05-29 | Disposition: A | Discharge status: Home / Self Care | Payer: Medicare Other | Source: Ambulatory Visit | Attending: General Surgery | Admitting: General Surgery

## 2018-05-29 ENCOUNTER — Ambulatory Visit (HOSPITAL_COMMUNITY): Payer: Medicare Other

## 2018-05-29 ENCOUNTER — Other Ambulatory Visit (HOSPITAL_COMMUNITY): Payer: Self-pay | Admitting: General Surgery

## 2018-05-29 DIAGNOSIS — I773 Arterial fibromuscular dysplasia: Secondary | ICD-10-CM

## 2018-05-29 DIAGNOSIS — I70211 Atherosclerosis of native arteries of extremities with intermittent claudication, right leg: Secondary | ICD-10-CM | POA: Diagnosis not present

## 2018-05-29 DIAGNOSIS — I739 Peripheral vascular disease, unspecified: Secondary | ICD-10-CM | POA: Diagnosis not present

## 2018-05-31 DIAGNOSIS — E119 Type 2 diabetes mellitus without complications: Secondary | ICD-10-CM | POA: Diagnosis not present

## 2018-05-31 DIAGNOSIS — I5030 Unspecified diastolic (congestive) heart failure: Secondary | ICD-10-CM | POA: Diagnosis not present

## 2018-05-31 DIAGNOSIS — I96 Gangrene, not elsewhere classified: Secondary | ICD-10-CM | POA: Diagnosis not present

## 2018-05-31 DIAGNOSIS — I87312 Chronic venous hypertension (idiopathic) with ulcer of left lower extremity: Secondary | ICD-10-CM | POA: Diagnosis not present

## 2018-05-31 DIAGNOSIS — L97322 Non-pressure chronic ulcer of left ankle with fat layer exposed: Secondary | ICD-10-CM | POA: Diagnosis not present

## 2018-06-15 DIAGNOSIS — I70212 Atherosclerosis of native arteries of extremities with intermittent claudication, left leg: Secondary | ICD-10-CM | POA: Diagnosis not present

## 2018-06-15 DIAGNOSIS — I739 Peripheral vascular disease, unspecified: Secondary | ICD-10-CM | POA: Diagnosis not present

## 2018-06-15 DIAGNOSIS — L97322 Non-pressure chronic ulcer of left ankle with fat layer exposed: Secondary | ICD-10-CM | POA: Diagnosis not present

## 2018-06-15 DIAGNOSIS — I87312 Chronic venous hypertension (idiopathic) with ulcer of left lower extremity: Secondary | ICD-10-CM | POA: Diagnosis not present

## 2018-06-20 DIAGNOSIS — I10 Essential (primary) hypertension: Secondary | ICD-10-CM | POA: Diagnosis not present

## 2018-06-20 DIAGNOSIS — E1140 Type 2 diabetes mellitus with diabetic neuropathy, unspecified: Secondary | ICD-10-CM | POA: Diagnosis not present

## 2018-06-20 DIAGNOSIS — Z125 Encounter for screening for malignant neoplasm of prostate: Secondary | ICD-10-CM | POA: Diagnosis not present

## 2018-06-20 DIAGNOSIS — E7849 Other hyperlipidemia: Secondary | ICD-10-CM | POA: Diagnosis not present

## 2018-06-23 DIAGNOSIS — L97322 Non-pressure chronic ulcer of left ankle with fat layer exposed: Secondary | ICD-10-CM | POA: Diagnosis not present

## 2018-06-23 DIAGNOSIS — I96 Gangrene, not elsewhere classified: Secondary | ICD-10-CM | POA: Diagnosis not present

## 2018-06-23 DIAGNOSIS — I5030 Unspecified diastolic (congestive) heart failure: Secondary | ICD-10-CM | POA: Diagnosis not present

## 2018-06-23 DIAGNOSIS — E1152 Type 2 diabetes mellitus with diabetic peripheral angiopathy with gangrene: Secondary | ICD-10-CM | POA: Diagnosis not present

## 2018-06-23 DIAGNOSIS — I87312 Chronic venous hypertension (idiopathic) with ulcer of left lower extremity: Secondary | ICD-10-CM | POA: Diagnosis not present

## 2018-06-23 DIAGNOSIS — I110 Hypertensive heart disease with heart failure: Secondary | ICD-10-CM | POA: Diagnosis not present

## 2018-06-29 DIAGNOSIS — Z1212 Encounter for screening for malignant neoplasm of rectum: Secondary | ICD-10-CM | POA: Diagnosis not present

## 2018-06-30 DIAGNOSIS — I96 Gangrene, not elsewhere classified: Secondary | ICD-10-CM | POA: Diagnosis not present

## 2018-06-30 DIAGNOSIS — I87312 Chronic venous hypertension (idiopathic) with ulcer of left lower extremity: Secondary | ICD-10-CM | POA: Diagnosis not present

## 2018-06-30 DIAGNOSIS — I5030 Unspecified diastolic (congestive) heart failure: Secondary | ICD-10-CM | POA: Diagnosis not present

## 2018-06-30 DIAGNOSIS — E1152 Type 2 diabetes mellitus with diabetic peripheral angiopathy with gangrene: Secondary | ICD-10-CM | POA: Diagnosis not present

## 2018-06-30 DIAGNOSIS — L97322 Non-pressure chronic ulcer of left ankle with fat layer exposed: Secondary | ICD-10-CM | POA: Diagnosis not present

## 2018-07-07 DIAGNOSIS — E11622 Type 2 diabetes mellitus with other skin ulcer: Secondary | ICD-10-CM | POA: Diagnosis not present

## 2018-07-07 DIAGNOSIS — L97329 Non-pressure chronic ulcer of left ankle with unspecified severity: Secondary | ICD-10-CM | POA: Diagnosis not present

## 2018-07-11 ENCOUNTER — Other Ambulatory Visit: Payer: Self-pay | Admitting: Physician Assistant

## 2018-07-14 DIAGNOSIS — E119 Type 2 diabetes mellitus without complications: Secondary | ICD-10-CM | POA: Diagnosis not present

## 2018-07-14 DIAGNOSIS — I87312 Chronic venous hypertension (idiopathic) with ulcer of left lower extremity: Secondary | ICD-10-CM | POA: Diagnosis not present

## 2018-07-14 DIAGNOSIS — E11622 Type 2 diabetes mellitus with other skin ulcer: Secondary | ICD-10-CM | POA: Diagnosis not present

## 2018-07-14 DIAGNOSIS — L97322 Non-pressure chronic ulcer of left ankle with fat layer exposed: Secondary | ICD-10-CM | POA: Diagnosis not present

## 2018-07-17 ENCOUNTER — Ambulatory Visit: Payer: Medicare Other | Admitting: Podiatry

## 2018-07-21 DIAGNOSIS — Z8631 Personal history of diabetic foot ulcer: Secondary | ICD-10-CM | POA: Diagnosis not present

## 2018-07-21 DIAGNOSIS — L97829 Non-pressure chronic ulcer of other part of left lower leg with unspecified severity: Secondary | ICD-10-CM | POA: Diagnosis not present

## 2018-07-21 DIAGNOSIS — Z09 Encounter for follow-up examination after completed treatment for conditions other than malignant neoplasm: Secondary | ICD-10-CM | POA: Diagnosis not present

## 2018-07-21 DIAGNOSIS — E11622 Type 2 diabetes mellitus with other skin ulcer: Secondary | ICD-10-CM | POA: Diagnosis not present

## 2018-08-15 ENCOUNTER — Other Ambulatory Visit: Payer: Self-pay | Admitting: Cardiology

## 2018-08-15 ENCOUNTER — Other Ambulatory Visit: Payer: Self-pay | Admitting: Physician Assistant

## 2018-08-15 NOTE — Telephone Encounter (Signed)
Carvedilol refilled.

## 2018-08-21 ENCOUNTER — Ambulatory Visit: Payer: Medicare Other | Admitting: Podiatry

## 2018-10-12 ENCOUNTER — Other Ambulatory Visit: Payer: Self-pay | Admitting: Cardiology

## 2018-10-30 ENCOUNTER — Encounter: Payer: Self-pay | Admitting: Podiatry

## 2018-10-30 ENCOUNTER — Ambulatory Visit (INDEPENDENT_AMBULATORY_CARE_PROVIDER_SITE_OTHER): Payer: Medicare Other | Admitting: Podiatry

## 2018-10-30 ENCOUNTER — Other Ambulatory Visit: Payer: Self-pay

## 2018-10-30 VITALS — Temp 96.6°F | Resp 16

## 2018-10-30 DIAGNOSIS — B351 Tinea unguium: Secondary | ICD-10-CM

## 2018-10-30 DIAGNOSIS — E1142 Type 2 diabetes mellitus with diabetic polyneuropathy: Secondary | ICD-10-CM

## 2018-10-30 DIAGNOSIS — E1169 Type 2 diabetes mellitus with other specified complication: Secondary | ICD-10-CM | POA: Diagnosis not present

## 2018-10-30 NOTE — Progress Notes (Signed)
Subjective:  Patient ID: Kerry Waters, male    DOB: 16-Apr-1946,  MRN: [id number]  Chief Complaint  Patient presents with  . debride    BL nailt rimming   . Diabetes    FBS: 140 x 1 day A1C: 7 PCP: [name] x 3 mo    73 y.o. male presents  for diabetic foot care. History as above. States the left leg wound has healed.  Review of Systems: Negative except as noted in the HPI. Denies N/V/F/Ch.  Past Medical History:  Diagnosis Date  . Basal cell carcinoma   . Carotid stenosis    a. Carotid US (04/2012):  bilateral ICA 40-59%  . Coronary artery disease    a. s/p CABG 1993;  b  Lexiscan 2015  . Diabetes mellitus    Dr. [name] follows.  [name] History of MI (myocardial infarction) 1987   ANTERIOR SEPTAL  . History of stroke    occipital in 04/2012 tx at [facility]  . Hyperlipidemia   . Hypertension   . Ischemic cardiomyopathy    a.  Echo (01/08/2013): Mild LVH, EF 25-30%, AS akinesis, Inf HK, Ant HK, Gr 1 DD, Ao sclerosis, no AS, mod MAC, mild LAE.;   b.  Echo (05/10/13): EF 35-40%, anteroseptal, anterior, anterolateral and apical HK.  . NSTEMI (non-ST elevated myocardial infarction) ([place])    a. 12/2012 in setting of sepsis syndrome - EF 25-30% on echo; ? Type 2 NSTEMI; b. f/u echo 04/2013 with EF 35-40%; Myoview with inf and AL infarct and peri-infarct ischemia, EF 38% => Med Rx recommended  . Obesities, morbid ([place])   . Peripheral neuropathy   . PVD (peripheral vascular disease) ([place])   . Sepsis ([place]) 12/2012  . Sleep apnea    CPAP  . SVT (supraventricular tachycardia) ([place])    a. 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  . Toe osteomyelitis, right ([place])    a. s/p R great toe amputation  . Venous stasis     Current Outpatient Medications:  .  aspirin EC 81 MG tablet, Take 81 mg by mouth 2 (two) times daily. , Disp: , Rfl:  .  atorvastatin (LIPITOR) 80 MG tablet, Take 80  mg by mouth daily., Disp: , Rfl:  .  carvedilol (COREG) 12.5 MG tablet, Take 1 tablet by mouth twice daily, Disp: 60 tablet, Rfl: 5 .  Coenzyme Q10 (COQ10 PO), Take 300 mg by mouth daily., Disp: , Rfl:  .  furosemide (LASIX) 20 MG tablet, Take 20 mg by mouth daily. , Disp: , Rfl:  .  hydrALAZINE (APRESOLINE) 25 MG tablet, TAKE 1 TABLET BY MOUTH THREE TIMES DAILY, Disp: 90 tablet, Rfl: 0 .  HYDROcodone-acetaminophen (NORCO) 5-325 MG per tablet, Take 1-2 tablets by mouth every 4 (four) hours as needed for moderate pain., Disp: 40 tablet, Rfl: 0 .  insulin NPH-regular Human (NOVOLIN 70/30) (70-30) 100 UNIT/ML injection, Inject into the skin See admin instructions. 35 UNITS EVERY MORNING AND 25 UNITS EVERY EVENING WITH MEALS, Disp: , Rfl:  .  Krill Oil 500 MG CAPS, Take 1 capsule by mouth daily., Disp: , Rfl:  .  lisinopril (PRINIVIL,ZESTRIL) 40 MG tablet, Take 40 mg by mouth daily., Disp: , Rfl: 3 .  metFORMIN (GLUCOPHAGE) 500 MG tablet, Take 2 tablets every morning and 1 tablet every evening., Disp: , Rfl:  .  Multiple Vitamins-Minerals (PRESERVISION AREDS 2 PO), Take 1 tablet  by mouth 2 (two) times daily., Disp: , Rfl:  .  Multiple Vitamins-Minerals (PRESERVISION AREDS 2) CAPS, Take 1 capsule by mouth daily., Disp: , Rfl:  .  nitroGLYCERIN (NITROSTAT) 0.4 MG SL tablet, Place 1 tablet (0.4 mg total) under the tongue every 5 (five) minutes as needed for chest pain., Disp: 25 tablet, Rfl: 11  Social History   Tobacco Use  Smoking Status Former Smoker  . Packs/day: 1.00  . Years: 30.00  . Pack years: 30.00  . Types: Cigarettes  . Quit date: 04/19/1985  . Years since quitting: 33.5  Smokeless Tobacco Never Used    No Known Allergies Objective:   Vitals:   10/29/2018 1123  Resp: 16  Temp: (!) 96.6 F (35.9 C)   There is no height or weight on file to calculate BMI. Constitutional Well developed. Well nourished.  Vascular Dorsalis pedis pulses present 1+ bilaterally  Posterior tibial  pulses present 1+ bilaterally  Pedal hair growth diminished. Capillary refill normal to all digits.  No cyanosis or clubbing noted.  Neurologic Normal speech. Oriented to person, place, and time. Epicritic sensation to light touch grossly present bilaterally. Protective sensation with 5.07 monofilament  present bilaterally. Vibratory sensation present bilaterally.  Dermatologic Nails elongated, thickened, dystrophic. L Leg VLU healed. No skin lesions.  Orthopedic: Normal joint ROM without pain or crepitus bilaterally. No visible deformities. No bony tenderness.   Assessment:   1. Onychomycosis of multiple toenails with type 2 diabetes mellitus and peripheral neuropathy ([place])    Plan:  Patient was evaluated and treated and all questions answered.  DM with R Hallux Amputation -DM risk 3 -Nails debrided x9  Procedure: Nail Debridement Rationale: Patient meets criteria for routine foot care due to amputation hx Type of Debridement: manual, sharp debridement. Instrumentation: Nail nipper, rotary burr. Number of Nails: 9    No follow-ups on file.

## 2018-11-18 DEATH — deceased

## 2019-01-19 ENCOUNTER — Ambulatory Visit: Payer: PRIVATE HEALTH INSURANCE | Admitting: Internal Medicine

## 2019-01-29 ENCOUNTER — Ambulatory Visit: Payer: Medicare Other | Admitting: Podiatry

## 2019-02-07 ENCOUNTER — Telehealth: Payer: Self-pay | Admitting: *Deleted

## 2019-02-07 NOTE — Telephone Encounter (Signed)
A message was left, re: his follow up visit.
